# Patient Record
Sex: Male | Born: 1945 | Race: White | Hispanic: No | Marital: Married | Smoking: Current every day smoker
Health system: Southern US, Community
[De-identification: ages and names within clinical notes are randomized; demographics above are authoritative.]

## PROBLEM LIST (undated history)

## (undated) DIAGNOSIS — Z860100 Personal history of colon polyps, unspecified: Secondary | ICD-10-CM

## (undated) DIAGNOSIS — M1990 Unspecified osteoarthritis, unspecified site: Secondary | ICD-10-CM

## (undated) DIAGNOSIS — C4491 Basal cell carcinoma of skin, unspecified: Secondary | ICD-10-CM

## (undated) DIAGNOSIS — K649 Unspecified hemorrhoids: Secondary | ICD-10-CM

## (undated) DIAGNOSIS — K5900 Constipation, unspecified: Secondary | ICD-10-CM

## (undated) DIAGNOSIS — K469 Unspecified abdominal hernia without obstruction or gangrene: Secondary | ICD-10-CM

## (undated) DIAGNOSIS — G5600 Carpal tunnel syndrome, unspecified upper limb: Secondary | ICD-10-CM

## (undated) DIAGNOSIS — T8859XA Other complications of anesthesia, initial encounter: Secondary | ICD-10-CM

## (undated) DIAGNOSIS — R351 Nocturia: Secondary | ICD-10-CM

## (undated) DIAGNOSIS — R972 Elevated prostate specific antigen [PSA]: Secondary | ICD-10-CM

## (undated) DIAGNOSIS — T4145XA Adverse effect of unspecified anesthetic, initial encounter: Secondary | ICD-10-CM

## (undated) DIAGNOSIS — Z9889 Other specified postprocedural states: Secondary | ICD-10-CM

## (undated) DIAGNOSIS — M069 Rheumatoid arthritis, unspecified: Secondary | ICD-10-CM

## (undated) DIAGNOSIS — K5790 Diverticulosis of intestine, part unspecified, without perforation or abscess without bleeding: Secondary | ICD-10-CM

## (undated) DIAGNOSIS — K219 Gastro-esophageal reflux disease without esophagitis: Secondary | ICD-10-CM

## (undated) DIAGNOSIS — E785 Hyperlipidemia, unspecified: Secondary | ICD-10-CM

## (undated) DIAGNOSIS — R112 Nausea with vomiting, unspecified: Secondary | ICD-10-CM

## (undated) DIAGNOSIS — M75120 Complete rotator cuff tear or rupture of unspecified shoulder, not specified as traumatic: Secondary | ICD-10-CM

## (undated) DIAGNOSIS — R51 Headache: Secondary | ICD-10-CM

## (undated) DIAGNOSIS — Z5189 Encounter for other specified aftercare: Secondary | ICD-10-CM

## (undated) DIAGNOSIS — D649 Anemia, unspecified: Secondary | ICD-10-CM

## (undated) DIAGNOSIS — R200 Anesthesia of skin: Secondary | ICD-10-CM

## (undated) DIAGNOSIS — H269 Unspecified cataract: Secondary | ICD-10-CM

## (undated) HISTORY — PX: VARICOCELECTOMY: SHX1084

## (undated) HISTORY — PX: CYSTOSCOPY: SUR368

## (undated) HISTORY — PX: ANKLE SURGERY: SHX546

## (undated) HISTORY — PX: BACK SURGERY: SHX140

## (undated) HISTORY — DX: Basal cell carcinoma of skin, unspecified: C44.91

## (undated) HISTORY — DX: Anemia, unspecified: D64.9

## (undated) HISTORY — DX: Rheumatoid arthritis, unspecified: M06.9

## (undated) HISTORY — DX: Personal history of colonic polyps: Z86.010

## (undated) HISTORY — DX: Carpal tunnel syndrome, unspecified upper limb: G56.00

## (undated) HISTORY — PX: TONSILLECTOMY: SHX5217

## (undated) HISTORY — DX: Unspecified cataract: H26.9

## (undated) HISTORY — DX: Complete rotator cuff tear or rupture of unspecified shoulder, not specified as traumatic: M75.120

## (undated) HISTORY — DX: Personal history of colon polyps, unspecified: Z86.0100

## (undated) HISTORY — DX: Elevated prostate specific antigen (PSA): R97.20

## (undated) HISTORY — DX: Unspecified osteoarthritis, unspecified site: M19.90

## (undated) HISTORY — DX: Hyperlipidemia, unspecified: E78.5

## (undated) HISTORY — PX: APPENDECTOMY: SHX54

## (undated) HISTORY — DX: Unspecified abdominal hernia without obstruction or gangrene: K46.9

## (undated) HISTORY — DX: Encounter for other specified aftercare: Z51.89

## (undated) HISTORY — DX: Diverticulosis of intestine, part unspecified, without perforation or abscess without bleeding: K57.90

## (undated) HISTORY — PX: CARPAL TUNNEL RELEASE: SHX101

## (undated) HISTORY — DX: Unspecified hemorrhoids: K64.9

## (undated) HISTORY — DX: Gastro-esophageal reflux disease without esophagitis: K21.9

## (undated) HISTORY — PX: RECTAL SURGERY: SHX760

---

## 1970-01-07 HISTORY — PX: OTHER SURGICAL HISTORY: SHX169

## 2000-10-24 ENCOUNTER — Ambulatory Visit (HOSPITAL_COMMUNITY): Admission: RE | Admit: 2000-10-24 | Discharge: 2000-10-24 | Payer: Self-pay | Admitting: Gastroenterology

## 2000-10-24 ENCOUNTER — Encounter: Payer: Self-pay | Admitting: Internal Medicine

## 2000-10-24 ENCOUNTER — Encounter (INDEPENDENT_AMBULATORY_CARE_PROVIDER_SITE_OTHER): Payer: Self-pay | Admitting: Specialist

## 2003-04-05 ENCOUNTER — Encounter: Payer: Self-pay | Admitting: Internal Medicine

## 2003-07-12 ENCOUNTER — Encounter: Admission: RE | Admit: 2003-07-12 | Discharge: 2003-07-12 | Payer: Self-pay | Admitting: Internal Medicine

## 2003-08-10 ENCOUNTER — Encounter: Payer: Self-pay | Admitting: Internal Medicine

## 2003-11-07 ENCOUNTER — Ambulatory Visit: Payer: Self-pay | Admitting: Internal Medicine

## 2003-11-28 ENCOUNTER — Ambulatory Visit: Payer: Self-pay | Admitting: Gastroenterology

## 2004-02-10 ENCOUNTER — Ambulatory Visit: Payer: Self-pay | Admitting: Internal Medicine

## 2004-04-04 ENCOUNTER — Ambulatory Visit: Payer: Self-pay | Admitting: Internal Medicine

## 2004-04-12 ENCOUNTER — Ambulatory Visit: Payer: Self-pay | Admitting: Internal Medicine

## 2004-10-18 ENCOUNTER — Ambulatory Visit: Payer: Self-pay | Admitting: Internal Medicine

## 2004-10-24 ENCOUNTER — Ambulatory Visit: Payer: Self-pay | Admitting: Internal Medicine

## 2004-10-25 ENCOUNTER — Ambulatory Visit: Payer: Self-pay | Admitting: Internal Medicine

## 2004-11-06 ENCOUNTER — Ambulatory Visit: Payer: Self-pay | Admitting: Internal Medicine

## 2004-11-13 ENCOUNTER — Ambulatory Visit: Payer: Self-pay | Admitting: Gastroenterology

## 2004-11-19 ENCOUNTER — Ambulatory Visit (HOSPITAL_COMMUNITY): Admission: RE | Admit: 2004-11-19 | Discharge: 2004-11-19 | Payer: Self-pay | Admitting: Gastroenterology

## 2004-11-19 ENCOUNTER — Ambulatory Visit: Payer: Self-pay | Admitting: Gastroenterology

## 2004-11-19 DIAGNOSIS — K649 Unspecified hemorrhoids: Secondary | ICD-10-CM | POA: Insufficient documentation

## 2005-01-08 ENCOUNTER — Ambulatory Visit: Payer: Self-pay | Admitting: Internal Medicine

## 2005-01-14 ENCOUNTER — Ambulatory Visit: Payer: Self-pay | Admitting: Internal Medicine

## 2005-01-24 ENCOUNTER — Ambulatory Visit: Payer: Self-pay | Admitting: Gastroenterology

## 2005-01-30 ENCOUNTER — Ambulatory Visit: Payer: Self-pay | Admitting: Gastroenterology

## 2005-03-29 ENCOUNTER — Ambulatory Visit: Payer: Self-pay | Admitting: Internal Medicine

## 2005-05-07 ENCOUNTER — Ambulatory Visit: Payer: Self-pay | Admitting: Internal Medicine

## 2005-05-16 ENCOUNTER — Ambulatory Visit: Payer: Self-pay | Admitting: Internal Medicine

## 2005-05-18 ENCOUNTER — Ambulatory Visit: Payer: Self-pay | Admitting: Internal Medicine

## 2005-05-22 ENCOUNTER — Encounter: Admission: RE | Admit: 2005-05-22 | Discharge: 2005-05-22 | Payer: Self-pay | Admitting: Internal Medicine

## 2005-05-24 ENCOUNTER — Ambulatory Visit: Payer: Self-pay | Admitting: Internal Medicine

## 2005-06-18 ENCOUNTER — Encounter: Admission: RE | Admit: 2005-06-18 | Discharge: 2005-06-18 | Payer: Self-pay | Admitting: Neurology

## 2005-08-15 ENCOUNTER — Ambulatory Visit: Payer: Self-pay | Admitting: Internal Medicine

## 2005-12-03 ENCOUNTER — Ambulatory Visit: Payer: Self-pay | Admitting: Internal Medicine

## 2005-12-18 ENCOUNTER — Ambulatory Visit: Payer: Self-pay | Admitting: Internal Medicine

## 2006-03-24 ENCOUNTER — Ambulatory Visit: Payer: Self-pay | Admitting: Internal Medicine

## 2006-06-05 ENCOUNTER — Ambulatory Visit: Payer: Self-pay | Admitting: Gastroenterology

## 2006-06-06 ENCOUNTER — Ambulatory Visit: Payer: Self-pay | Admitting: Gastroenterology

## 2006-06-27 ENCOUNTER — Ambulatory Visit: Payer: Self-pay | Admitting: Internal Medicine

## 2006-06-27 ENCOUNTER — Encounter: Payer: Self-pay | Admitting: Internal Medicine

## 2006-07-28 ENCOUNTER — Ambulatory Visit: Payer: Self-pay | Admitting: Gastroenterology

## 2006-07-31 DIAGNOSIS — M1990 Unspecified osteoarthritis, unspecified site: Secondary | ICD-10-CM | POA: Insufficient documentation

## 2006-07-31 DIAGNOSIS — J309 Allergic rhinitis, unspecified: Secondary | ICD-10-CM | POA: Insufficient documentation

## 2006-08-04 ENCOUNTER — Ambulatory Visit: Payer: Self-pay | Admitting: Internal Medicine

## 2006-08-04 ENCOUNTER — Encounter: Admission: RE | Admit: 2006-08-04 | Discharge: 2006-08-04 | Payer: Self-pay | Admitting: Internal Medicine

## 2006-08-20 ENCOUNTER — Encounter: Payer: Self-pay | Admitting: Internal Medicine

## 2006-09-03 ENCOUNTER — Ambulatory Visit (HOSPITAL_BASED_OUTPATIENT_CLINIC_OR_DEPARTMENT_OTHER): Admission: RE | Admit: 2006-09-03 | Discharge: 2006-09-03 | Payer: Self-pay | Admitting: Orthopedic Surgery

## 2006-10-03 ENCOUNTER — Telehealth: Payer: Self-pay | Admitting: Internal Medicine

## 2006-10-15 ENCOUNTER — Ambulatory Visit: Payer: Self-pay | Admitting: Internal Medicine

## 2006-10-15 LAB — CONVERTED CEMR LAB
Bilirubin Urine: NEGATIVE
Nitrite: NEGATIVE
Protein, U semiquant: NEGATIVE
Specific Gravity, Urine: 1.02
Urobilinogen, UA: 2
WBC Urine, dipstick: NEGATIVE
pH: 6.5

## 2007-03-27 ENCOUNTER — Telehealth: Payer: Self-pay | Admitting: Internal Medicine

## 2007-03-30 ENCOUNTER — Ambulatory Visit: Payer: Self-pay | Admitting: Internal Medicine

## 2007-03-30 DIAGNOSIS — K219 Gastro-esophageal reflux disease without esophagitis: Secondary | ICD-10-CM | POA: Insufficient documentation

## 2007-03-31 ENCOUNTER — Encounter: Payer: Self-pay | Admitting: Internal Medicine

## 2007-04-02 ENCOUNTER — Telehealth: Payer: Self-pay | Admitting: Internal Medicine

## 2007-04-14 ENCOUNTER — Ambulatory Visit: Payer: Self-pay | Admitting: Internal Medicine

## 2007-04-14 ENCOUNTER — Telehealth: Payer: Self-pay | Admitting: Internal Medicine

## 2007-04-14 LAB — CONVERTED CEMR LAB
Nitrite: NEGATIVE
Protein, U semiquant: NEGATIVE
WBC Urine, dipstick: NEGATIVE

## 2007-04-16 ENCOUNTER — Encounter: Payer: Self-pay | Admitting: Internal Medicine

## 2007-04-28 ENCOUNTER — Ambulatory Visit: Payer: Self-pay | Admitting: Internal Medicine

## 2007-05-04 ENCOUNTER — Telehealth: Payer: Self-pay | Admitting: Internal Medicine

## 2007-05-09 ENCOUNTER — Emergency Department (HOSPITAL_COMMUNITY): Admission: EM | Admit: 2007-05-09 | Discharge: 2007-05-09 | Payer: Self-pay | Admitting: Family Medicine

## 2007-06-08 ENCOUNTER — Encounter: Payer: Self-pay | Admitting: Gastroenterology

## 2007-08-04 ENCOUNTER — Ambulatory Visit: Payer: Self-pay | Admitting: Internal Medicine

## 2007-08-05 ENCOUNTER — Telehealth: Payer: Self-pay | Admitting: Internal Medicine

## 2007-08-10 ENCOUNTER — Telehealth: Payer: Self-pay | Admitting: Internal Medicine

## 2007-08-11 ENCOUNTER — Telehealth (INDEPENDENT_AMBULATORY_CARE_PROVIDER_SITE_OTHER): Payer: Self-pay | Admitting: *Deleted

## 2007-08-11 ENCOUNTER — Encounter: Payer: Self-pay | Admitting: Internal Medicine

## 2007-08-11 ENCOUNTER — Telehealth: Payer: Self-pay | Admitting: Internal Medicine

## 2007-08-17 ENCOUNTER — Encounter: Admission: RE | Admit: 2007-08-17 | Discharge: 2007-10-07 | Payer: Self-pay | Admitting: Internal Medicine

## 2007-08-31 ENCOUNTER — Telehealth: Payer: Self-pay | Admitting: Internal Medicine

## 2007-09-03 ENCOUNTER — Encounter: Payer: Self-pay | Admitting: Internal Medicine

## 2007-09-07 ENCOUNTER — Telehealth: Payer: Self-pay | Admitting: Internal Medicine

## 2007-09-08 ENCOUNTER — Encounter: Payer: Self-pay | Admitting: Internal Medicine

## 2007-09-09 ENCOUNTER — Telehealth (INDEPENDENT_AMBULATORY_CARE_PROVIDER_SITE_OTHER): Payer: Self-pay | Admitting: *Deleted

## 2007-09-16 ENCOUNTER — Ambulatory Visit (HOSPITAL_BASED_OUTPATIENT_CLINIC_OR_DEPARTMENT_OTHER): Admission: RE | Admit: 2007-09-16 | Discharge: 2007-09-16 | Payer: Self-pay | Admitting: Orthopedic Surgery

## 2007-09-29 ENCOUNTER — Encounter: Payer: Self-pay | Admitting: Internal Medicine

## 2007-10-15 ENCOUNTER — Telehealth: Payer: Self-pay | Admitting: Internal Medicine

## 2007-10-20 ENCOUNTER — Ambulatory Visit: Payer: Self-pay | Admitting: Internal Medicine

## 2007-10-20 LAB — CONVERTED CEMR LAB
ALT: 18 units/L (ref 0–53)
AST: 18 units/L (ref 0–37)
Albumin: 3.9 g/dL (ref 3.5–5.2)
Alkaline Phosphatase: 68 units/L (ref 39–117)
Basophils Relative: 0.4 % (ref 0.0–3.0)
Bilirubin Urine: NEGATIVE
Bilirubin, Direct: 0.1 mg/dL (ref 0.0–0.3)
Blood in Urine, dipstick: NEGATIVE
Calcium: 9.7 mg/dL (ref 8.4–10.5)
Creatinine, Ser: 1.1 mg/dL (ref 0.4–1.5)
Eosinophils Relative: 2.9 % (ref 0.0–5.0)
Glucose, Bld: 97 mg/dL (ref 70–99)
Hemoglobin: 13.1 g/dL (ref 13.0–17.0)
Ketones, urine, test strip: NEGATIVE
Monocytes Relative: 9.6 % (ref 3.0–12.0)
Neutro Abs: 3.6 10*3/uL (ref 1.4–7.7)
Nitrite: NEGATIVE
PSA: 4.6 ng/mL — ABNORMAL HIGH (ref 0.10–4.00)
Platelets: 308 10*3/uL (ref 150–400)
Protein, U semiquant: NEGATIVE
RBC: 4.39 M/uL (ref 4.22–5.81)
Sodium: 144 meq/L (ref 135–145)
Total Bilirubin: 0.9 mg/dL (ref 0.3–1.2)
Total Protein: 6.7 g/dL (ref 6.0–8.3)
Urobilinogen, UA: 1
VLDL: 51 mg/dL — ABNORMAL HIGH (ref 0–40)
WBC Urine, dipstick: NEGATIVE
WBC: 6.6 10*3/uL (ref 4.5–10.5)

## 2007-11-09 ENCOUNTER — Ambulatory Visit: Payer: Self-pay | Admitting: Internal Medicine

## 2007-11-09 ENCOUNTER — Telehealth: Payer: Self-pay | Admitting: Internal Medicine

## 2007-11-09 LAB — HM COLONOSCOPY

## 2007-11-16 ENCOUNTER — Telehealth: Payer: Self-pay | Admitting: Internal Medicine

## 2007-11-30 ENCOUNTER — Ambulatory Visit: Payer: Self-pay | Admitting: Internal Medicine

## 2007-12-14 ENCOUNTER — Encounter: Payer: Self-pay | Admitting: Internal Medicine

## 2008-01-09 ENCOUNTER — Ambulatory Visit: Payer: Self-pay | Admitting: Internal Medicine

## 2008-01-25 ENCOUNTER — Encounter: Payer: Self-pay | Admitting: Internal Medicine

## 2008-02-02 ENCOUNTER — Encounter: Payer: Self-pay | Admitting: Internal Medicine

## 2008-03-01 ENCOUNTER — Ambulatory Visit: Payer: Self-pay | Admitting: Gastroenterology

## 2008-03-08 ENCOUNTER — Telehealth: Payer: Self-pay | Admitting: Gastroenterology

## 2008-03-23 ENCOUNTER — Ambulatory Visit: Payer: Self-pay | Admitting: Gastroenterology

## 2008-04-06 ENCOUNTER — Ambulatory Visit: Payer: Self-pay | Admitting: Gastroenterology

## 2008-04-13 ENCOUNTER — Ambulatory Visit: Payer: Self-pay | Admitting: Gastroenterology

## 2008-04-20 ENCOUNTER — Ambulatory Visit: Payer: Self-pay | Admitting: Internal Medicine

## 2008-04-27 ENCOUNTER — Ambulatory Visit: Payer: Self-pay | Admitting: Gastroenterology

## 2008-04-28 ENCOUNTER — Ambulatory Visit: Payer: Self-pay | Admitting: Internal Medicine

## 2008-05-04 ENCOUNTER — Ambulatory Visit: Payer: Self-pay | Admitting: Gastroenterology

## 2008-05-05 ENCOUNTER — Ambulatory Visit: Payer: Self-pay | Admitting: Family Medicine

## 2008-05-11 ENCOUNTER — Ambulatory Visit: Payer: Self-pay | Admitting: Gastroenterology

## 2008-05-16 ENCOUNTER — Telehealth: Payer: Self-pay | Admitting: Gastroenterology

## 2008-05-18 ENCOUNTER — Ambulatory Visit: Payer: Self-pay | Admitting: Gastroenterology

## 2008-05-25 ENCOUNTER — Ambulatory Visit: Payer: Self-pay | Admitting: Gastroenterology

## 2008-05-26 ENCOUNTER — Telehealth: Payer: Self-pay | Admitting: Gastroenterology

## 2008-06-01 ENCOUNTER — Ambulatory Visit: Payer: Self-pay | Admitting: Gastroenterology

## 2008-06-13 ENCOUNTER — Ambulatory Visit: Payer: Self-pay | Admitting: Gastroenterology

## 2008-06-22 ENCOUNTER — Ambulatory Visit: Payer: Self-pay | Admitting: Internal Medicine

## 2008-07-15 ENCOUNTER — Encounter: Payer: Self-pay | Admitting: Gastroenterology

## 2008-07-19 ENCOUNTER — Encounter (INDEPENDENT_AMBULATORY_CARE_PROVIDER_SITE_OTHER): Payer: Self-pay | Admitting: *Deleted

## 2008-08-05 ENCOUNTER — Encounter: Payer: Self-pay | Admitting: Internal Medicine

## 2008-08-11 ENCOUNTER — Encounter: Payer: Self-pay | Admitting: Internal Medicine

## 2008-08-19 ENCOUNTER — Telehealth: Payer: Self-pay | Admitting: Gastroenterology

## 2008-08-24 ENCOUNTER — Encounter: Payer: Self-pay | Admitting: Gastroenterology

## 2008-08-24 ENCOUNTER — Telehealth: Payer: Self-pay | Admitting: Gastroenterology

## 2008-08-24 DIAGNOSIS — K228 Other specified diseases of esophagus: Secondary | ICD-10-CM

## 2008-08-24 DIAGNOSIS — K2289 Other specified disease of esophagus: Secondary | ICD-10-CM | POA: Insufficient documentation

## 2008-10-11 ENCOUNTER — Telehealth: Payer: Self-pay | Admitting: Gastroenterology

## 2008-12-07 ENCOUNTER — Telehealth: Payer: Self-pay | Admitting: Internal Medicine

## 2008-12-08 ENCOUNTER — Ambulatory Visit: Payer: Self-pay | Admitting: Internal Medicine

## 2008-12-09 ENCOUNTER — Encounter: Admission: RE | Admit: 2008-12-09 | Discharge: 2008-12-09 | Payer: Self-pay | Admitting: Internal Medicine

## 2008-12-12 ENCOUNTER — Telehealth: Payer: Self-pay | Admitting: Internal Medicine

## 2008-12-12 DIAGNOSIS — M542 Cervicalgia: Secondary | ICD-10-CM | POA: Insufficient documentation

## 2008-12-13 ENCOUNTER — Telehealth: Payer: Self-pay | Admitting: Internal Medicine

## 2009-01-11 ENCOUNTER — Encounter: Payer: Self-pay | Admitting: Internal Medicine

## 2009-01-17 ENCOUNTER — Encounter: Payer: Self-pay | Admitting: Gastroenterology

## 2009-02-02 ENCOUNTER — Encounter: Payer: Self-pay | Admitting: Internal Medicine

## 2009-02-24 ENCOUNTER — Telehealth: Payer: Self-pay | Admitting: Internal Medicine

## 2009-03-07 HISTORY — PX: NECK SURGERY: SHX720

## 2009-03-15 ENCOUNTER — Telehealth: Payer: Self-pay | Admitting: Internal Medicine

## 2009-03-15 ENCOUNTER — Encounter: Payer: Self-pay | Admitting: Internal Medicine

## 2009-03-16 ENCOUNTER — Ambulatory Visit: Payer: Self-pay | Admitting: Internal Medicine

## 2009-03-20 LAB — CONVERTED CEMR LAB: Vit D, 25-Hydroxy: 22 ng/mL — ABNORMAL LOW (ref 30–89)

## 2009-03-29 ENCOUNTER — Encounter: Payer: Self-pay | Admitting: Internal Medicine

## 2009-04-02 ENCOUNTER — Encounter: Payer: Self-pay | Admitting: Internal Medicine

## 2009-04-13 ENCOUNTER — Encounter: Payer: Self-pay | Admitting: Internal Medicine

## 2009-05-19 ENCOUNTER — Encounter: Payer: Self-pay | Admitting: Internal Medicine

## 2009-06-14 ENCOUNTER — Telehealth: Payer: Self-pay | Admitting: Internal Medicine

## 2009-07-13 ENCOUNTER — Ambulatory Visit: Payer: Self-pay | Admitting: Internal Medicine

## 2009-07-13 LAB — CONVERTED CEMR LAB
Cholesterol: 261 mg/dL — ABNORMAL HIGH (ref 0–200)
HDL: 49.8 mg/dL (ref >39.00)
Total CHOL/HDL Ratio: 5
Triglycerides: 111 mg/dL (ref 0.0–149.0)
VLDL: 22.2 mg/dL (ref 0.0–40.0)

## 2009-07-14 ENCOUNTER — Encounter: Payer: Self-pay | Admitting: Internal Medicine

## 2009-07-21 ENCOUNTER — Ambulatory Visit: Payer: Self-pay | Admitting: Internal Medicine

## 2009-07-21 DIAGNOSIS — R972 Elevated prostate specific antigen [PSA]: Secondary | ICD-10-CM | POA: Insufficient documentation

## 2009-08-03 ENCOUNTER — Encounter: Payer: Self-pay | Admitting: Internal Medicine

## 2009-09-14 ENCOUNTER — Ambulatory Visit: Payer: Self-pay | Admitting: Internal Medicine

## 2009-09-14 LAB — CONVERTED CEMR LAB
Alkaline Phosphatase: 67 units/L (ref 39–117)
HDL: 41.4 mg/dL (ref >39.00)
Triglycerides: 120 mg/dL (ref 0.0–149.0)

## 2009-09-20 ENCOUNTER — Ambulatory Visit: Payer: Self-pay | Admitting: Internal Medicine

## 2009-09-20 DIAGNOSIS — E785 Hyperlipidemia, unspecified: Secondary | ICD-10-CM | POA: Insufficient documentation

## 2009-12-07 ENCOUNTER — Ambulatory Visit: Payer: Self-pay | Admitting: Internal Medicine

## 2009-12-07 DIAGNOSIS — R21 Rash and other nonspecific skin eruption: Secondary | ICD-10-CM | POA: Insufficient documentation

## 2009-12-12 LAB — CONVERTED CEMR LAB
Folate: 13.9 ng/mL
Sed Rate: 17 mm/hr (ref 0–22)
Vitamin B-12: 364 pg/mL (ref 211–911)

## 2009-12-14 ENCOUNTER — Encounter: Payer: Self-pay | Admitting: Internal Medicine

## 2009-12-29 ENCOUNTER — Encounter: Payer: Self-pay | Admitting: Internal Medicine

## 2010-01-02 ENCOUNTER — Telehealth: Payer: Self-pay | Admitting: Internal Medicine

## 2010-01-25 ENCOUNTER — Encounter: Payer: Self-pay | Admitting: Gastroenterology

## 2010-02-06 NOTE — Progress Notes (Signed)
Summary: Pt req to get cholesterol lvl and vit d lvl checked  Phone Note Call from Patient Call back at Home Phone [phone]   Caller: Patient Summary of Call: Pt called and was going to sch a lab to get Vit D lvl rechecked and pt also would like to get his cholesterol lvl checked at the same time. Need to order lab for cholesterol.      Initial call taken by: Lucy Antigua,  June 14, 2009 4:48 PM  Follow-up for Phone Call        ok to schedule (fasting) 272.4 for cholesterol. Follow-up by: Gladis Riffle, RN,  June 15, 2009 9:26 AM  Additional Follow-up for Phone Call Additional follow up Details #1::        I called pt and sch vit d and cholesterol lab for 07/13/09 and follow up for 07/21/09, as noted above.   Additional Follow-up by: Lucy Antigua,  June 15, 2009 9:58 AM

## 2010-02-06 NOTE — Progress Notes (Signed)
Summary: Vit D order  Phone Note Call from Patient   Caller: Spouse Call For: Birdie Sons MD Summary of Call: seeing acupuncturist; they want a Vitamin D level checked - can you order? Initial call taken by: Raechel Ache, RN,  March 15, 2009 9:19 AM  Follow-up for Phone Call        ok Follow-up by: Birdie Sons MD,  March 15, 2009 2:00 PM  Additional Follow-up for Phone Call Additional follow up Details #1::        scheduled Additional Follow-up by: Raechel Ache, RN,  March 15, 2009 2:09 PM

## 2010-02-06 NOTE — Progress Notes (Signed)
Summary: 2nd opinion  Phone Note Call from Patient   Caller: Patient Call For: Birdie Sons MD Summary of Call: wants neuro 2nd opinion before he has surgery. Saw Dr Phoebe Perch. Initial call taken by: Raechel Ache, RN,  February 24, 2009 8:56 AM  Follow-up for Phone Call        ok per Dr Cato Mulligan- suggest [facility] Told patient he needs to check with insurance to see if they'll cover. Sent to [place]. Follow-up by: Raechel Ache, RN,  February 24, 2009 8:57 AM

## 2010-02-06 NOTE — Letter (Signed)
Summary: [facility]  [facility]   Imported By: Maryln Gottron 04/27/2009 12:55:49  _____________________________________________________________________  External Attachment:    Type:   Image     Comment:   External Document

## 2010-02-06 NOTE — Assessment & Plan Note (Signed)
Summary: blisters all over fingers/cjr   Vital Signs:  Patient profile:   65 year old male Weight:      142 pounds Temp:     98.2 degrees F oral BP sitting:   142 / 78  (left arm) Cuff size:   regular  Vitals Entered By: Alfred Levins, CMA (December 07, 2009 11:22 AM) CC: burning blisters on hands   Primary Care Dequavion Follette:  Birdie Sons, MD  CC:  burning blisters on hands.  History of Present Illness: 3 month hx of intermittent blistering sensation of hands---he will notice small red dots on hands. has some associated numbness only at site of "blister". no unusual exposures. He does admit to working more with with over the past several weeks to months. No fever, chills, other rashes.  Patient denies any other complaints in a complete review of systems.  Current Medications (verified): 1)  Proair Hfa 108 (90 Base) Mcg/act  Aers (Albuterol Sulfate) .... 2 Puffs Three Times A Day As Needed 2)  Flomax 0.4 Mg Cp24 (Tamsulosin Hcl) .... Take 1 Capsule By Mouth Daily 3)  Prevacid 30 Mg Cpdr (Lansoprazole) .Marland Kitchen.. 1 Tablet By Mouth Once Daily 4)  Fish Oil 1000 Mg Caps (Omega-3 Fatty Acids) .Marland Kitchen.. 1 Capsule By Mouth Three Times A Day 5)  Hydrocodone-Acetaminophen 5-500 Mg Tabs (Hydrocodone-Acetaminophen) .... One Every 6 Hours For Pain As Needed 6)  Simvastatin 20 Mg Tabs (Simvastatin) .Marland Kitchen.. 1 By Mouth At Bedtime 7)  Gabapentin 100 Mg Caps (Gabapentin) .... Take One Tab By Mouth Two Times A Day 8)  Benadryl 25 Mg Caps (Diphenhydramine Hcl) .... As Needed  Allergies (verified): 1)  Ibuprofen (Ibuprofen) 2)  * Ivp Dye 3)  Levaquin (Levofloxacin) 4)  Morphine Sulfate (Morphine Sulfate) 5)  Sulfamethoxazole (Sulfamethoxazole) 6)  Anaprox (Naproxen Sodium) 7)  Aspirin (Aspirin) 8)  Codeine Phosphate (Codeine Phosphate)  Past History:  Past Medical History: Last updated: 09/20/2009 Allergic rhinitis Osteoarthritis Elevated Lipids recent ankle surgery GERD Hyperlipidemia  Past  Surgical History: Last updated: 07/21/2009 EGD-06/27/2006 Appendectomy Tonsillectomy Varicocelr removed L ankle mass removed 1972 ankle surgery--left (bednarz) neck surgery 3/11  Family History: Last updated: 05/25/2008 Family History Diabetes 1st degree relative--sister sister with breast CA 2nd sister with breast CA mother deceased - strokes 65 father - cerebral aneurysm---87 yo No FH of Colon Cancer:  Social History: Last updated: 05/25/2008 Married Current Smoker less than 1 ppd Alcohol use-no Daily Caffeine Use -6 Illicit Drug Use - no Patient does not get regular exercise.   Risk Factors: Exercise: no (03/01/2008)  Risk Factors: Smoking Status: current (07/31/2006)  Physical Exam  General:  well-developed well-nourished male in no acute distress. HEENT exam atraumatic normocephalic no chest clear to auscultation neurologic exam no significant rashes. No findings on the hands. Sensation is intact in extremities bilaterally.   Impression & Recommendations:  Problem # 1:  RASH-NONVESICULAR (ICD-782.1) more subjective findings than anything else. Objective findings are minimal if any. I'll check lab work space on the subjective findings. We'll determine what to do after that. Orders: Venipuncture (46962) Specimen Handling (95284) TLB-B12 + Folate Pnl (13244_01027-O53/GUY) TLB-TSH (Thyroid Stimulating Hormone) (84443-TSH) TLB-Sedimentation Rate (ESR) (85652-ESR)  Complete Medication List: 1)  Proair Hfa 108 (90 Base) Mcg/act Aers (Albuterol sulfate) .... 2 puffs three times a day as needed 2)  Flomax 0.4 Mg Cp24 (Tamsulosin hcl) .... Take 1 capsule by mouth daily 3)  Prevacid 30 Mg Cpdr (Lansoprazole) .Marland Kitchen.. 1 tablet by mouth once daily 4)  Fish Oil 1000  Mg Caps (Omega-3 fatty acids) .Marland Kitchen.. 1 capsule by mouth three times a day 5)  Hydrocodone-acetaminophen 5-500 Mg Tabs (Hydrocodone-acetaminophen) .... One every 6 hours for pain as needed 6)  Simvastatin 20 Mg Tabs  (Simvastatin) .Marland Kitchen.. 1 by mouth at bedtime 7)  Gabapentin 100 Mg Caps (Gabapentin) .... Take one tab by mouth two times a day 8)  Benadryl 25 Mg Caps (Diphenhydramine hcl) .... As needed   Orders Added: 1)  Est. Patient Level III [16109] 2)  Venipuncture [60454] 3)  Specimen Handling [99000] 4)  TLB-B12 + Folate Pnl [[id number]-B12/FOL] 5)  TLB-TSH (Thyroid Stimulating Hormone) [84443-TSH] 6)  TLB-Sedimentation Rate (ESR) [09811-BJY]

## 2010-02-06 NOTE — Letter (Signed)
Summary: [facility]  [facility]   Imported By: Maryln Gottron 06/13/2009 13:06:40  _____________________________________________________________________  External Attachment:    Type:   Image     Comment:   External Document

## 2010-02-06 NOTE — Assessment & Plan Note (Signed)
Summary: 2 MONTH FUP//CCM   Vital Signs:  Patient profile:   65 year old male Height:      64 inches Weight:      142 pounds BMI:     24.46 Temp:     97.7 degrees F oral BP sitting:   120 / 70  (left arm) Cuff size:   regular  Vitals Entered By: Kern Reap CMA Duncan Dull) (September 20, 2009 8:10 AM) CC: follow-up visit Is Patient Diabetic? No Pain Assessment Patient in pain? no        Primary Care Anyelo Mccue:  Birdie Sons, MD  CC:  follow-up visit.  History of Present Illness:  Follow-Up Visit      This is a 65 year old man who presents for Follow-up visit.  The patient denies chest pain and palpitations.  Since the last visit the patient notes no new problems or concerns.  The patient reports taking meds as prescribed.  When questioned about possible medication side effects, the patient notes none.   seeing neurosurgeon---babtist s/p neck surgery  All other systems reviewed and were negative except has some ankle pain---had great results with steroid injection. In addition he says that he gets occasional blisters on his fingers associated with a "numb" sensation.    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 2 Puffs Three Times A Day As Needed 2)  Flomax 0.4 Mg Cp24 (Tamsulosin Hcl) .... Take 1 Capsule By Mouth Daily 3)  Anucort-Hc 25 Mg Supp (Hydrocortisone Acetate) .... Put One in Rectum Twice Daily For 7 Days. 4)  Prevacid 30 Mg Cpdr (Lansoprazole) .Marland Kitchen.. 1 Tablet By Mouth Once Daily 5)  Tylenol Ex St  Arthritis Pain 500 Mg Tabs (Acetaminophen) .... Take By Mouth Once Daily 6)  Fish Oil 1000 Mg Caps (Omega-3 Fatty Acids) .Marland Kitchen.. 1 Capsule By Mouth Three Times A Day 7)  Amitriptyline Hcl 25 Mg Tabs (Amitriptyline Hcl) .... One At Bedtime 8)  Hydrocodone-Acetaminophen 5-500 Mg Tabs (Hydrocodone-Acetaminophen) .... One Every 6 Hours For Pain As Needed 9)  Vitamin D (Ergocalciferol) 50000 Unit Caps (Ergocalciferol) .... One By Mouth Weekly 10)  Simvastatin 20 Mg Tabs (Simvastatin) .Marland Kitchen.. 1 By Mouth At Bedtime 11)  Gabapentin 100 Mg Caps (Gabapentin) .... Take One Tab By Mouth Two Times A Day 12)  Benadryl 25 Mg Caps (Diphenhydramine Hcl) .... As Needed  Allergies: 1)  Ibuprofen (Ibuprofen) 2)  * Ivp Dye 3)  Levaquin (Levofloxacin) 4)  Morphine Sulfate (Morphine Sulfate) 5)  Sulfamethoxazole (Sulfamethoxazole) 6)  Anaprox (Naproxen Sodium) 7)  Aspirin (Aspirin) 8)  Codeine Phosphate (Codeine Phosphate)  Past History:  Past Surgical History: Last updated: 07/21/2009 EGD-06/27/2006 Appendectomy Tonsillectomy Varicocelr removed L ankle mass removed 1972 ankle surgery--left (bednarz) neck surgery 3/11  Family History: Last updated: 05/25/2008 Family History Diabetes 1st degree relative--sister sister with breast CA 2nd sister with breast CA mother deceased - strokes 50 father - cerebral aneurysm---87 yo No FH of Colon Cancer:  Social History: Last updated: 05/25/2008 Married Current Smoker less than 1 ppd Alcohol use-no Daily Caffeine Use -6 Illicit Drug Use -  no Patient does not get regular exercise.   Risk Factors: Exercise: no (03/01/2008)  Risk Factors: Smoking Status: current (07/31/2006)  Past Medical History: Allergic rhinitis Osteoarthritis Elevated Lipids recent ankle surgery GERD Hyperlipidemia  Physical Exam  General:  alert and well-developed.   Head:  normocephalic and atraumatic.   Eyes:  pupils equal and pupils round.   Ears:  R ear normal and L  ear normal.   Neck:  slight tenderness in the posterior neck with flexion Lungs:  Normal respiratory effort, chest expands symmetrically. Lungs are clear to auscultation, no crackles or wheezes. Heart:  normal rate and regular rhythm.   Abdomen:  soft and non-tender.   Msk:  normal ROM and no joint tenderness.   Neurologic:  cranial nerves II-XII intact and gait normal.   Skin:  turgor normal and color normal.   Psych:  normally interactive and not anxious appearing.     Impression & Recommendations:  Problem # 1:  SPONDYLOSIS, CERVICAL, WITH RADICULOPATHY (ICD-723.4) doing well s/p surgery  Problem # 2:  GERD (ICD-530.81) sxs controlled continue current medications  His updated medication list for this problem includes:    Prevacid 30 Mg Cpdr (Lansoprazole) .Marland Kitchen... 1 tablet by mouth once daily  EGD: Location: [facility]   (04/28/2008)  Labs Reviewed: Hgb: 13.1 (10/20/2007)   Hct: 38.9 (10/20/2007)  Problem # 3:  HYPERLIPIDEMIA (ICD-272.4) controlled continue current medications  His updated medication list for this problem includes:    Simvastatin 20 Mg Tabs (Simvastatin) .Marland Kitchen... 1 by mouth at bedtime  Labs Reviewed: SGOT: 23 (09/14/2009)   SGPT: 24 (09/14/2009)   HDL:41.40 (09/14/2009), 49.80 (07/13/2009)  LDL:106 (09/14/2009), DEL (10/20/2007)  Chol:171 (09/14/2009), 261 (07/13/2009)  Trig:120.0 (09/14/2009), 111.0 (07/13/2009)  Complete Medication List: 1)  Proair Hfa 108 (90 Base) Mcg/act Aers (Albuterol sulfate) .... 2 puffs three times a day as needed 2)  Flomax 0.4 Mg Cp24 (Tamsulosin hcl) .... Take 1 capsule by mouth daily 3)  Anucort-hc 25 Mg Supp (Hydrocortisone acetate) .... Put one in rectum twice daily for 7 days. 4)  Prevacid 30 Mg Cpdr (Lansoprazole) .Marland Kitchen.. 1 tablet by mouth once daily 5)  Tylenol Ex St Arthritis Pain 500 Mg Tabs (Acetaminophen) .... Take by mouth once daily 6)  Fish Oil 1000 Mg Caps (Omega-3 fatty acids) .Marland Kitchen.. 1 capsule by mouth  three times a day 7)  Amitriptyline Hcl 25 Mg Tabs (Amitriptyline hcl) .... One at bedtime 8)  Hydrocodone-acetaminophen 5-500 Mg Tabs (Hydrocodone-acetaminophen) .... One every 6 hours for pain as needed 9)  Vitamin D (ergocalciferol) 50000 Unit Caps (Ergocalciferol) .... One by mouth weekly 10)  Simvastatin 20 Mg Tabs (Simvastatin) .Marland Kitchen.. 1 by mouth at bedtime 11)  Gabapentin 100 Mg Caps (Gabapentin) .... Take one tab by mouth two times a day 12)  Benadryl 25 Mg Caps (Diphenhydramine hcl) .... As needed   Patient Instructions: 1)  Please schedule a follow-up appointment in 6 months. 2)  lipids 272.4 3)  liver 995.2 4)  bmet-995.2

## 2010-02-06 NOTE — Letter (Signed)
Summary: Order, Summary and Medication List/[facility]  Order, Summary and Medication List/[facility]   Imported By: Maryln Gottron 04/07/2009 15:27:04  _____________________________________________________________________  External Attachment:    Type:   Image     Comment:   External Document

## 2010-02-06 NOTE — Letter (Signed)
Summary: [facility]uro[facility]  [facility]   Imported By: Maryln Gottron 04/27/2009 12:56:57  _____________________________________________________________________  External Attachment:    Type:   Image     Comment:   External Document

## 2010-02-06 NOTE — Consult Note (Signed)
Summary: [facility]  [facility]   Imported By: Maryln Gottron 02/09/2009 12:50:58  _____________________________________________________________________  External Attachment:    Type:   Image     Comment:   External Document

## 2010-02-06 NOTE — Letter (Signed)
Summary: [facility]  [facility]   Imported By: Maryln Gottron 04/18/2009 12:41:29  _____________________________________________________________________  External Attachment:    Type:   Image     Comment:   External Document

## 2010-02-06 NOTE — Medication Information (Signed)
Summary: Prescriber Response Form/[facility]  Prescriber Response Form/[facility]   Imported By: Sherian Rein 01/19/2009 11:21:50  _____________________________________________________________________  External Attachment:    Type:   Image     Comment:   External Document

## 2010-02-06 NOTE — Letter (Signed)
Summary: [facility]  [facility]   Imported By: Maryln Gottron 02/09/2009 14:39:33  _____________________________________________________________________  External Attachment:    Type:   Image     Comment:   External Document

## 2010-02-06 NOTE — Assessment & Plan Note (Signed)
Summary: follow up from labs re: vit d and cholesterol/cjr   Vital Signs:  Patient profile:   65 year old male Height:      64 inches (162.56 cm) Weight:      146.31 pounds (66.50 kg) Temp:     98.3 degrees F (36.83 degrees C) oral Pulse rate:   80 / minute BP sitting:   122 / 70  (left arm) Cuff size:   regular  Vitals Entered By: Josph Macho RMA (July 21, 2009 8:52 AM)  Procedure Note Last Tetanus: Tdap (11/09/2007)  Injections: Duration of symptoms: months  Procedure # 1: joint injection    Location: ankle    Technique: 24 g needle    Medication: 20 mg depomedrol    Anesthesia: 1% lidocaine w/o epinephrine  CC: Follow-up visit on labs: Vit D & cholesterol/ CF Is Patient Diabetic? No   Primary Care Provider:  Birdie Sons, MD  CC:  Follow-up visit on labs: Vit D & cholesterol/ CF.  History of Present Illness:  Follow-Up Visit      This is a 65 year old man who presents for Follow-up visit.  The patient denies chest pain and palpitations.  Since the last visit the patient notes no new problems or concerns except has developed significant right ankle pain---no swelling, no trauma.  The patient reports taking meds as prescribed.  When questioned about possible medication side effects, the patient notes none.    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 2 Puffs Three Times A Day As Needed 2)  Flomax 0.4 Mg Cp24 (Tamsulosin Hcl) .... Take 1 Capsule By Mouth Daily 3)  Anucort-Hc 25 Mg Supp (Hydrocortisone Acetate) .... Put One in  Rectum Twice Daily For 7 Days. 4)  Prevacid 30 Mg Cpdr (Lansoprazole) .Marland Kitchen.. 1 Tablet By Mouth Once Daily 5)  Tylenol Ex St Arthritis Pain 500 Mg Tabs (Acetaminophen) .... Take By Mouth Once Daily 6)  Fish Oil 1000 Mg Caps (Omega-3 Fatty Acids) .Marland Kitchen.. 1 Capsule By Mouth Three Times A Day 7)  Amitriptyline Hcl 25 Mg Tabs (Amitriptyline Hcl) .... One At Bedtime 8)  Hydrocodone-Acetaminophen 5-500 Mg Tabs (Hydrocodone-Acetaminophen) .... One Every 6 Hours For Pain As Needed 9)  Vitamin D (Ergocalciferol) 50000 Unit Caps (Ergocalciferol) .... One By Mouth Weekly  Allergies (verified): 1)  Ibuprofen (Ibuprofen) 2)  * Ivp Dye 3)  Levaquin (Levofloxacin) 4)  Morphine Sulfate (Morphine Sulfate) 5)  Sulfamethoxazole (Sulfamethoxazole) 6)  Anaprox (Naproxen Sodium) 7)  Aspirin (Aspirin) 8)  Codeine Phosphate (Codeine Phosphate)  Past History:  Past Medical History: Last updated: 03/30/2007 Allergic rhinitis Osteoarthritis Elevated Lipids recent ankle surgery GERD  Family History: Last updated: 05/25/2008 Family History Diabetes 1st degree relative--sister sister with breast CA 2nd sister with breast CA mother deceased - strokes 67 father - cerebral aneurysm---87 yo No FH of Colon Cancer:  Social History: Last updated: 05/25/2008 Married Current Smoker less than 1 ppd Alcohol use-no Daily Caffeine Use -6 Illicit Drug Use - no Patient does not get regular exercise.   Risk Factors: Exercise: no (03/01/2008)  Risk  Factors: Smoking Status: current (07/31/2006)  Past Surgical History: EGD-06/27/2006 Appendectomy Tonsillectomy Varicocelr removed L ankle mass removed 1972 ankle surgery--left (bednarz) neck surgery 3/11  Review of Systems       All other systems reviewed and were negative except chronic ankle pain  Physical Exam  General:  alert and well-developed.   Head:  normocephalic, atraumatic, and no abnormalities observed.   Eyes:  pupils equal and pupils round.     Ears:  R ear normal and L ear normal.   Msk:  No deformity or scoliosis noted of thoracic or lumbar spine.   Neurologic:  cranial nerves II-XII intact and gait normal.     Impression & Recommendations:  Problem # 1:  GERD (ICD-530.81) sxs are well controlled His updated medication list for this problem includes:    Prevacid 30 Mg Cpdr (Lansoprazole) .Marland Kitchen... 1 tablet by mouth once daily  [facility]  Labs Reviewed: Hgb: 13.1 (10/20/2007)   Hct: 38.9 (10/20/2007)  Problem # 2:  PSA, INCREASED (ICD-790.93) has regular f/u  Problem # 3:  BENIGN PROSTATIC HYPERTROPHY, WITH URINARY OBSTRUCTION (ICD-600.01) sxs sable could be related to elevated psa His updated medication list for this problem includes:    Flomax 0.4 Mg Cp24 (Tamsulosin hcl) .Marland Kitchen... Take 1 capsule by mouth daily  Problem # 4:  OSTEOARTHRITIS (ICD-715.90) likely the cuase of his ankle pain discussed risks and benefits of treatment opitons he would like to proceed with injection verbal consent obtained His updated medication list for this problem includes:    Tylenol Ex St Arthritis Pain 500 Mg Tabs (Acetaminophen) .Marland Kitchen... Take by mouth once daily    Hydrocodone-acetaminophen 5-500 Mg Tabs (Hydrocodone-acetaminophen) ..... One every 6 hours for pain as needed  Orders: Joint Aspirate / Injection, Intermediate (20605) Depo- Medrol 40mg  (J1030)  Complete Medication List: 1)  Proair Hfa 108 (90 Base) Mcg/act Aers (Albuterol sulfate) .... 2 puffs three times a day as needed 2)  Flomax 0.4 Mg Cp24 (Tamsulosin hcl) .... Take 1 capsule by mouth daily 3)  Anucort-hc 25 Mg Supp (Hydrocortisone acetate) .... Put one in rectum twice daily for 7 days. 4)  Prevacid 30 Mg Cpdr (Lansoprazole) .Marland Kitchen.. 1 tablet by mouth once daily 5)  Tylenol Ex St Arthritis Pain 500 Mg Tabs (Acetaminophen) .... Take by mouth once daily 6)  Fish Oil 1000 Mg Caps (Omega-3 fatty acids) .Marland Kitchen.. 1 capsule by mouth  three times a day 7)  Amitriptyline Hcl 25 Mg Tabs (Amitriptyline hcl) .... One at bedtime 8)  Hydrocodone-acetaminophen 5-500 Mg Tabs (Hydrocodone-acetaminophen) .... One every 6 hours for pain as needed 9)  Vitamin D (ergocalciferol) 50000 Unit Caps (Ergocalciferol) .... One by mouth weekly 10)  Simvastatin 20 Mg Tabs (Simvastatin) .Marland Kitchen.. 1 by mouth at bedtime  Patient Instructions: 1)  Please schedule a follow-up appointment in 2 months. 2)  lipids 272.4 3)  liver 995.2 Prescriptions: SIMVASTATIN 20 MG TABS (SIMVASTATIN) 1 by mouth at bedtime  #90 x 3   Entered and Authorized by:   Birdie Sons MD   Signed by:   Birdie Sons MD on 07/21/2009   Method used:   [facility]        [facility] [phone][facility] (retail)       [address]       [place], [place]  [address]       Ph: [phone]       Fax: [phone]  RxID:   [id number]

## 2010-02-06 NOTE — Consult Note (Signed)
Summary: [facility]  [facility]   Imported By: Maryln Gottron 04/04/2009 15:37:56  _____________________________________________________________________  External Attachment:    Type:   Image     Comment:   External Document

## 2010-02-06 NOTE — Letter (Signed)
Summary: [facility]  [facility]   Imported By: Maryln Gottron 08/23/2009 12:57:51  _____________________________________________________________________  External Attachment:    Type:   Image     Comment:   External Document

## 2010-02-06 NOTE — Letter (Signed)
Summary: [facility]  [facility]   Imported By: Maryln Gottron 08/10/2009 11:12:38  _____________________________________________________________________  External Attachment:    Type:   Image     Comment:   External Document

## 2010-02-08 NOTE — Letter (Signed)
Summary: [facility]  [facility]   Imported By: Maryln Gottron 01/22/2010 14:55:10  _____________________________________________________________________  External Attachment:    Type:   Image     Comment:   External Document

## 2010-02-08 NOTE — Consult Note (Signed)
Summary: [facility]  [facility]   Imported By: Maryln Gottron 01/12/2010 15:43:29  _____________________________________________________________________  External Attachment:    Type:   Image     Comment:   External Document

## 2010-02-08 NOTE — Progress Notes (Signed)
Summary: labs  Phone Note Call from Patient Call back at Home Phone [phone]   Caller: Patient Call For: Birdie Sons MD Summary of Call: Pt is asking for lab results as he is seeing a Derm. and he wants to know the results and to run more tests.  Initial call taken by: Lynann Beaver CMA AAMA,  January 02, 2010 8:45 AM  Follow-up for Phone Call        ok to give lab results Follow-up by: Birdie Sons MD,  January 02, 2010 10:16 AM  Additional Follow-up for Phone Call Additional follow up Details #1::        pt just wanted to know what was drawn so he could tell his dermatologist.  Told pt Sed Rate, B12, and TSH Additional Follow-up by: Alfred Levins, CMA,  January 02, 2010 1:20 PM

## 2010-02-14 NOTE — Medication Information (Signed)
Summary: Prescriber Response Form/[facility]  Prescriber Response Form/[facility]   Imported By: Lennie Odor 02/05/2010 11:41:48  _____________________________________________________________________  External Attachment:    Type:   Image     Comment:   External Document

## 2010-03-14 ENCOUNTER — Other Ambulatory Visit: Payer: Self-pay

## 2010-03-21 ENCOUNTER — Ambulatory Visit: Payer: Self-pay | Admitting: Internal Medicine

## 2010-03-22 ENCOUNTER — Other Ambulatory Visit: Payer: Self-pay

## 2010-04-03 ENCOUNTER — Ambulatory Visit: Payer: Self-pay | Admitting: Internal Medicine

## 2010-04-12 ENCOUNTER — Telehealth: Payer: Self-pay | Admitting: Gastroenterology

## 2010-04-12 NOTE — Telephone Encounter (Signed)
Stool softeners if he has hard stools. Can try hemorrhoid supp OTC if bleeding worsens

## 2010-04-12 NOTE — Telephone Encounter (Signed)
Pt states that he had neck surgery last week. He has noticed that he is having rectal bleeding, states it is more than he has had in the past. He notices it when he wipes, it is bright red. Pt is on pain medicine and I let him  Know that it can cause constipation and this can cause more problems with bleeding from hemorrhoids. Pt wants to know if there is anything he needs to do differently. Dr. Arlyce Dice please advise.

## 2010-04-12 NOTE — Telephone Encounter (Signed)
Pt aware of Dr. Kaplan's recommendations. 

## 2010-05-22 NOTE — Assessment & Plan Note (Signed)
[facility]                         GASTROENTEROLOGY OFFICE NOTE   Christian Roberts, Christian Roberts                      MRN:          [id number]  DATE:06/05/2006                            DOB:          August 16, 1945    PROBLEM:  Pyrosis.   HISTORY OF PRESENT ILLNESS:  Christian Roberts has returned for re-evaluation.  Despite taking omeprazole 20 mg daily he is having frequent episodes of  pyrosis.  This occurs throughout the day, several days a week.  He also  complains of postprandial abdominal distension.  He has several poorly  formed stools a day.  He has a history of reflux and IBS.  Colonoscopy  in 2002 showed diverticulosis only.  He has taken several courses of  prednisone for a sinus infection and joint pains.   Other medications include:  1. Pseudoephedrine.  2. Benadryl.  3. Darvocet p.r.n.   EXAMINATION:  Pulse 78, blood pressure 116/60, weight 137.  HEENT:  EOMI.  PERRLA. Sclerae are anicteric.  Conjunctivae are pink.  NECK:  Supple without thyromegaly, adenopathy or carotid bruits.  CHEST:  Clear to auscultation and percussion without adventitious  sounds.  CARDIAC:  Regular rhythm; normal S1 S2.  There are no murmurs, gallops  or rubs.  ABDOMEN:  Bowel sounds are normoactive.  Abdomen is soft, non-tender and  non-distended.  There are no abdominal masses, tenderness, splenic  enlargement or hepatomegaly.  EXTREMITIES:  Full range of motion.  No cyanosis, clubbing or edema.  RECTAL:  Deferred.   IMPRESSION:  1. Gastroesophageal reflux disease - poorly controlled on omeprazole.      A history of prednisone use raises the question of Candida      esophagitis.  2. Dyspepsia - likely secondary to number 1.   RECOMMENDATIONS:  The patient will consider enrollment in a GERD study.  Failing that I will schedule him for upper endoscopy and switch him to  Zegerid.     Barbette Hair. Arlyce Dice, MD,FACG  Electronically Signed    RDK/MedQ  DD: 06/05/2006   DT: 06/05/2006  Job #: [id number]   cc:   Valetta Mole. Swords, MD

## 2010-05-22 NOTE — Op Note (Signed)
NAMEGERY, SABEDRA               ACCOUNT NO.:  [id number]   MEDICAL RECORD NO.:  [id number]          PATIENT TYPE:  AMB   LOCATION:  DSC                          FACILITY:  [facility]   PHYSICIAN:  Leonides Grills, M.D.     DATE OF BIRTH:  09/17/1945   DATE OF PROCEDURE:  09/16/2007  DATE OF DISCHARGE:                               OPERATIVE REPORT   PREOPERATIVE DIAGNOSES:  1. Left anterior ankle impingement.  2. 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   POSTOPERATIVE DIAGNOSES:  1. Left anterior ankle impingement.  2. 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   OPERATION:  1. Left ankle arthroscopy with extensive debridement.  2. Left debridement and drilling lateral and medial talar dome      osteochondral lesions.   ANESTHESIA:  General.   SURGEON:  Leonides Grills, MD   ASSISTANT:  Richardean Canal, PA-C   ESTIMATED BLOOD LOSS:  Minimal.   TOURNIQUET TIME:  None.   COMPLICATIONS:  None.   Lesion size:  Medial is quite large 1.5 x 2 cm, lateral lesion is a  shoulder type lesion probably 1.5 x 4 mm.   DISPOSITION:  Stable to PR.   INDICATIONS:  This is a 65 year old male who had a previous arthroscopic  debridement and drilling of his osteochondral lesions.  He had  persistent pain and swelling and synovitis to the point it was  interfering with his life.  Once again, he was consented for the above  procedure.  All risks which include infection, nerve or vessel injury,  persistent pain, worsening pain, prolonged recovery, stiffness,  arthritis, recurrence of the osteochondral lesion, and possibility of an  ankle fusion versus replacement in the future were all explained.  Questions were encouraged and answered.   OPERATION:  The patient was brought to the operating room and placed in  supine position after adequate general endotracheal anesthesia was  administered as well as Ancef gram IV piggyback.  Bump was placed in  left ipsilateral  hip with silicone pad and the left lower extremity was  then prepped and draped in a sterile manner.  No tourniquet was used.  Anatomical landmarks including anterior tibialis tendon, peroneus  tertius tendon, and superficial peroneal nerve were mapped out.  Previous incisions were identified and marked as well.  A spinal needle  was then placed just medial to the anterior tibialis tendon through the  previous incision and 20 mL of normal saline instilled into the ankle.  Nick-and-spread technique was then utilized creating anteromedial  incision.  Blunt tip trocar with cannula followed by camera was then  placed in the ankle in direct visualization.  Anterolateral portals  created with a spinal needle followed by nick-and-spread technique  through this previous incision.  There was a tremendous amount of  synovitis over the entire anterolateral aspects of the ankle, and  extensive debridement was then performed with a shaver as well as a  radiofrequency bevel.  Once this was debrided back, it was obvious that  there was an osteochondral lesion over the lateral talar dome.  This was  then debrided with a shaver as well as a House curette down to bone.  We  then placed multiple drill holes using a microfracture technique.  Pictures were obtained throughout the procedure.  Lateral gutter was  also debrided as well as anterolaterally.  We then placed the camera  anterolaterally visualizing anteromedially.  Again, there was a  tremendous amount of synovitis in this area.  This was debrided with a  shaver as well as radiofrequency bevel.  This included into the medial  gutter.  This lesion, however, was quite large involving the talar dome.  Again, this was 2 x 1.5 cm in size and was completely delaminated and  elevated and floating literally within the joint.  This was carefully  debrided back with a House curette and then removed.  The area was  shaven with a Cuda shaver to bleeding bone.   The entire lesion bled  beautifully.  There were no cystic areas.  There was synovitis  posterolaterally, and this was also removed as well.  The pictures were  obtained throughout the procedure.  Camera was removed.  Wounds were  closed with 4-0 nylon stitch.  Sterile dressing was applied.  Cam walker  boot was applied.  The patient was stable to PR.      Leonides Grills, M.D.  Electronically Signed     PB/MEDQ  D:  09/16/2007  T:  09/17/2007  Job:  [id number]

## 2010-05-22 NOTE — Assessment & Plan Note (Signed)
[facility]                                 ON-CALL NOTE   RAYMEL, CULL                        MRN:          [id number]  DATE:10/15/2006                            DOB:          January 26, 1945    TIME RECEIVED:  5:46 p.m.   CALLER:  Clydie Braun at [employer].   He sees Dr. Cato Mulligan.  Telephone [phone].  The patient was seen today and  diagnosed with prostatitis.  He was given a prescription for a 10-day  course of Cipro; however, the pharmacist noticed that he is allergic to  [facility], and thinks that this would be a problem.  He is also allergic  to SULFA.  My response is to cancel the prescription for Cipro, but  instead give him 10 days of doxycycline 100 mg to take b.i.d.  He will  follow up as needed.     Tera Mater. Clent Ridges, MD  Electronically Signed    SAF/MedQ  DD: 10/15/2006  DT: 10/16/2006  Job #: [id number]

## 2010-05-22 NOTE — Op Note (Signed)
NAMEKORBYN, CHOPIN               ACCOUNT NO.:  [id number]   MEDICAL RECORD NO.:  [id number]          PATIENT TYPE:  AMB   LOCATION:  DSC                          FACILITY:  [facility]   PHYSICIAN:  Leonides Grills, M.D.     DATE OF BIRTH:  1945/12/20   DATE OF PROCEDURE:  09/03/2006  DATE OF DISCHARGE:                               OPERATIVE REPORT   PREOPERATIVE DIAGNOSIS:  1. Left anterior ankle impingement.  2. Left lateral talar dome shoulder type osteochondral lesion.  3. Anterior distal tibial spurs.   POSTOPERATIVE DIAGNOSIS:  1. Left anterior ankle impingement.  2. Left lateral talar dome shoulder type osteochondral lesion.  3. Anterior distal tibial spurs.   OPERATION:  1. Left ankle arthroscopy with extensive debridement.  2. Left ankle debridement drilling lateral talar dome osteochondral      lesion.  3. Stress x-rays, left ankle.  4. Excision anterior distal tibial spurs, left ankle.   ANESTHESIA:  General with block.   SURGEON:  Leonides Grills, M.D.   ASSISTANT:  Evlyn Kanner, P.A.-C.   ESTIMATED BLOOD LOSS:  Minimal.   TOURNIQUET TIME:  Approximately an hour.   COMPLICATIONS:  None.   DISPOSITION:  Stable to the PR.   INDICATIONS:  This is a 65 year old male who has had persistent  anterolateral ankle pain.  We obtained an MRI which showed that he had a  lateral talar dome osteochondral lesion with advanced synovitis in this  area. 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 Due to the proximity of both pathology, it was  difficult to determine which was bothering him more. We scheduled him  for possible subtalar arthrodesis with local bone graft. He was  consented to the above procedure.  All risks including infection, nerve  or vessel injury, nonunion, malunion, hardware rotation, hardware  failure, persistent pain, worse pain, prolonged recovery, stiffness,  arthritis, and the possibility of future surgery were  all explained,  questions were encouraged and answered.   PROCEDURE:  The patient was brought to the operating room and placed in  the supine position. After adequate general endotracheal anesthesia was  administered with block as well as Ancef 1 gram IV piggyback, the  patient was then placed in a sloppy lateral position on a beanbag.  All  bony prominences were well padded.  The left lower extremity was prepped  and draped in a sterile manner. We started the procedure by anatomically  mapping out the anterior tibialis and peroneus tertius superficial  peroneal nerve.  A spinal needle was then placed just medial to the  anterior tibialis tendon into the joint and 20 mL normal saline filled  the joint.  Weston Brass and spread technique was then utilized to create the  anterior medial portal just medial to the anterior tibialis tendon.  Blunt tip trocar with cannula followed by camera was then placed into  the ankle and under direct visualization, the anterolateral portal was  created lateral to the peroneus tertius tendon as well as the  superficial peroneal nerve.  There was a tremendous amount  of synovitis  over the entire anterior aspect of the ankle as well as anterolaterally  extending into the lateral gutter. There was an obvious large spur  extending over the entire anterior aspect of the tibia. With  dorsiflexion, there was anterior ankle impingement, as well.  There was  a patch of arthritis in the anterior portion of the tibial plafond as  well as an obvious lateral shoulder type talar dome osteochondral  lesion. We proceeded to perform an extensive debridement with several  different instruments to include a radiofrequency bevel, 2.9 mm shaver,  and 3.5 mm bur as well as a synovectomy rongeur.  This took quite some  time to remove all the impinging areas and with dorsiflexion,  essentially this was completely debrided.  We then addressed the lateral  talar dome osteochondral lesion  with curets. This was carried down to  bone and normal cartilage at its periphery. We then, using a  microfracture instrument, performed the drilling in multiple areas. This  bled nicely. We then placed the camera anterolaterally visualizing  anteromedially and, again, there was a large spur that extended with the  synovitis.  We continued the extensive debridement and spur removal. We  found that there was a loose body in the medial gutter that was part of  the anterior deltoid fibers. This was carefully dissected loose and  pulled out with a synovectomy rongeur.  Stress x-rays were obtained and  showed no impingement anteriorly with the spur adequately removed from  the anterior aspect of the ankle and lateral plane of the ankle with  neutral and forced dorsiflexion.  It was determined, at this point, that  due to the advanced pathology within the ankle, that the more  conservative approach would be not to fuse the subtalar joint to see how  much pain relief he will get.  We will later do a differential subtalar  block if he did have persistent pain in the sinus tarsi area lateral  gutter region to see where his pain was coming from at this point.  The  tourniquet was deflated.  Hemostasis was obtained.  The lateral talar  dome bled beautifully.  There was no pulsatile bleeding.  The wounds  were closed with 4-0 nylon stitch.  A sterile dressing was applied.  A  modified Jones dressing was applied.  The patient was stable to PR.      Leonides Grills, M.D.  Electronically Signed     PB/MEDQ  D:  09/03/2006  T:  09/03/2006  Job:  [id number]   cc:   Valetta Mole. Swords, MD

## 2010-06-19 ENCOUNTER — Encounter: Payer: Self-pay | Admitting: Internal Medicine

## 2010-06-19 ENCOUNTER — Ambulatory Visit (INDEPENDENT_AMBULATORY_CARE_PROVIDER_SITE_OTHER): Payer: BC Managed Care – PPO | Admitting: Internal Medicine

## 2010-06-19 VITALS — BP 110/70 | Temp 98.0°F | Wt 146.0 lb

## 2010-06-19 DIAGNOSIS — M1990 Unspecified osteoarthritis, unspecified site: Secondary | ICD-10-CM

## 2010-06-19 MED ORDER — PREDNISONE 10 MG PO TABS: 10.0000 mg | ORAL_TABLET | Freq: Every day | ORAL | Status: AC

## 2010-06-19 MED ORDER — METHYLPREDNISOLONE ACETATE 80 MG/ML IJ SUSP: 80.0000 mg | Freq: Once | INTRAMUSCULAR | Status: DC

## 2010-06-19 NOTE — Patient Instructions (Signed)
Keep left foot elevated as much as possible  Continue ice treatments  Call or return to clinic prn if these symptoms worsen or fail to improve as anticipated.

## 2010-06-19 NOTE — Progress Notes (Signed)
  Subjective:    Patient ID: Christian Roberts, male    DOB: Jan 31, 1945, 65 y.o.   MRN: [id number]  HPI  65 year old patient who is seen today with a chief complaint of left ankle pain. He is a recent cervical spine surgery in March of this year. Initially a surgery involving left ankle for a cyst in 1972 he has had recurrent left ankle surgery in September of 2010 as well as September of 2009. The fascia days he's had increasing pain and normal left ankle medially. There's been no trauma. He states that he has had similar flares of pain over the years that have responded to steroids. He states he is allergic to nonsteroidal anti-inflammatory drugs. He has been using ice which has helped the swelling but he is still uncomfortable he is also using lidocaine patches  Review of Systems  Musculoskeletal: Positive for joint swelling and gait problem.       Objective:   Physical Exam  Constitutional: He appears well-developed and well-nourished. No distress.       Afebrile cervical collar in place  Musculoskeletal:       Left ankle revealed no erythema or excessive warmth. He did have lidocaine patches over the area medially;  no ischemic changes were noted          Assessment & Plan:   Arthritis left ankle. We'll refill his hydrocodone. He has responded well to steroids in the past we'll give an injection of Depo-Medrol 80 and also oral medications if needed

## 2010-06-20 ENCOUNTER — Ambulatory Visit: Payer: Self-pay | Admitting: Internal Medicine

## 2010-08-14 ENCOUNTER — Other Ambulatory Visit (INDEPENDENT_AMBULATORY_CARE_PROVIDER_SITE_OTHER): Payer: BC Managed Care – PPO

## 2010-08-14 DIAGNOSIS — E785 Hyperlipidemia, unspecified: Secondary | ICD-10-CM

## 2010-08-14 DIAGNOSIS — T887XXA Unspecified adverse effect of drug or medicament, initial encounter: Secondary | ICD-10-CM

## 2010-08-14 DIAGNOSIS — D649 Anemia, unspecified: Secondary | ICD-10-CM

## 2010-08-14 DIAGNOSIS — E039 Hypothyroidism, unspecified: Secondary | ICD-10-CM

## 2010-08-14 LAB — CBC WITH DIFFERENTIAL/PLATELET
Basophils Absolute: 0 10*3/uL (ref 0.0–0.1)
Basophils Relative: 0.6 % (ref 0.0–3.0)
Eosinophils Absolute: 0.2 10*3/uL (ref 0.0–0.7)
Hemoglobin: 13.5 g/dL (ref 13.0–17.0)
Lymphs Abs: 2.4 10*3/uL (ref 0.7–4.0)
MCHC: 33.3 g/dL (ref 30.0–36.0)
MCV: 86.9 fl (ref 78.0–100.0)
Monocytes Absolute: 0.7 10*3/uL (ref 0.1–1.0)
Neutro Abs: 4.9 10*3/uL (ref 1.4–7.7)
RBC: 4.66 Mil/uL (ref 4.22–5.81)
RDW: 15.1 % — ABNORMAL HIGH (ref 11.5–14.6)

## 2010-08-14 LAB — BASIC METABOLIC PANEL
CO2: 29 mEq/L (ref 19–32)
Chloride: 106 mEq/L (ref 96–112)
Glucose, Bld: 110 mg/dL — ABNORMAL HIGH (ref 70–99)
Sodium: 144 mEq/L (ref 135–145)

## 2010-08-14 LAB — LIPID PANEL
Cholesterol: 249 mg/dL — ABNORMAL HIGH (ref 0–200)
HDL: 44.5 mg/dL (ref >39.00)
Triglycerides: 137 mg/dL (ref 0.0–149.0)

## 2010-08-14 LAB — HEPATIC FUNCTION PANEL
Albumin: 4 g/dL (ref 3.5–5.2)
Total Protein: 6.8 g/dL (ref 6.0–8.3)

## 2010-08-15 ENCOUNTER — Telehealth: Payer: Self-pay | Admitting: *Deleted

## 2010-08-15 LAB — LDL CHOLESTEROL, DIRECT: Direct LDL: 172.5 mg/dL

## 2010-08-15 NOTE — Telephone Encounter (Signed)
Would like lab results ASAP.

## 2010-08-17 ENCOUNTER — Other Ambulatory Visit: Payer: Self-pay | Admitting: Gastroenterology

## 2010-08-17 NOTE — Telephone Encounter (Signed)
Pt aware.

## 2010-08-20 ENCOUNTER — Encounter: Payer: Self-pay | Admitting: Internal Medicine

## 2010-08-20 ENCOUNTER — Ambulatory Visit (INDEPENDENT_AMBULATORY_CARE_PROVIDER_SITE_OTHER): Payer: BC Managed Care – PPO | Admitting: Internal Medicine

## 2010-08-20 VITALS — BP 112/64 | Temp 97.6°F | Ht 64.0 in | Wt 143.0 lb

## 2010-08-20 DIAGNOSIS — Z2911 Encounter for prophylactic immunotherapy for respiratory syncytial virus (RSV): Secondary | ICD-10-CM

## 2010-08-20 DIAGNOSIS — N138 Other obstructive and reflux uropathy: Secondary | ICD-10-CM

## 2010-08-20 DIAGNOSIS — E119 Type 2 diabetes mellitus without complications: Secondary | ICD-10-CM | POA: Insufficient documentation

## 2010-08-20 DIAGNOSIS — R7989 Other specified abnormal findings of blood chemistry: Secondary | ICD-10-CM

## 2010-08-20 DIAGNOSIS — E785 Hyperlipidemia, unspecified: Secondary | ICD-10-CM

## 2010-08-20 DIAGNOSIS — N401 Enlarged prostate with lower urinary tract symptoms: Secondary | ICD-10-CM

## 2010-08-20 DIAGNOSIS — R7309 Other abnormal glucose: Secondary | ICD-10-CM

## 2010-08-20 MED ORDER — SIMVASTATIN 20 MG PO TABS: 20.0000 mg | ORAL_TABLET | Freq: Every day | ORAL | Status: DC

## 2010-08-20 NOTE — Progress Notes (Signed)
  Subjective:    Patient ID: Christian Roberts, male    DOB: January 28, 1945, 65 y.o.   MRN: [id number]  HPI  Recent neck surgery---dr sweezy. [facility]. BPH sxs much better. Had labs done a1c elevated at 7.3%  Lipids---has quit simvastatin.  Past Medical History  Diagnosis Date  . Allergic rhinitis   . Osteoarthritis   . Elevated lipids   . GERD (gastroesophageal reflux disease)   . Hyperlipidemia    Past Surgical History  Procedure Date  . Ankle surgery   . Esophagogastroduodenoscopy 06/27/2006  . Appendectomy   . Tonsillectomy   . Varicocelectomy   . Left ankle mass removed 1972  . Neck surgery 03/11    reports that he has been smoking Cigarettes.  He has been smoking about .5 packs per day. He has never used smokeless tobacco. He reports that he does not drink alcohol or use illicit drugs. family history includes Breast cancer in his sisters; Cerebral aneurysm in his father; Diabetes in his sister; and Stroke in his mother. Allergies  Allergen Reactions  . Aspirin     REACTION: unspecified  . Codeine Phosphate     REACTION: unspecified  . Ibuprofen     REACTION: unspecified  . Levofloxacin     REACTION: unspecified  . Morphine Sulfate     REACTION: unspecified  . Naproxen Sodium     REACTION: unspecified  . Sulfamethoxazole     REACTION: unspecified     Review of Systems  patient denies chest pain, shortness of breath, orthopnea. Denies lower extremity edema, abdominal pain, change in appetite, change in bowel movements. Patient denies rashes, musculoskeletal complaints. No other specific complaints in a complete review of systems.      Objective:   Physical Exam  well-developed well-nourished male in no acute distress. HEENT exam atraumatic, normocephalic, neck supple without jugular venous distention. Wearing a neck brace. Chest clear to auscultation cardiac exam S1-S2 are regular. Abdominal exam overweight with bowel sounds,  soft and nontender. Extremities no edema. Neurologic exam is alert with a normal gait.        Assessment & Plan:

## 2010-08-20 NOTE — Assessment & Plan Note (Addendum)
Needs to treat with diet and exercise aggressively- Refer DTC  a1c is diagnostic of DM but that is the only test thus far. I do not see a fasting glucose above 126 or another a1c above 6.5%

## 2010-08-20 NOTE — Assessment & Plan Note (Signed)
Has been noncompliant with meds  resume simvastatin

## 2010-08-20 NOTE — Assessment & Plan Note (Signed)
Much improved. Has f/u with urology

## 2010-09-11 ENCOUNTER — Ambulatory Visit: Payer: BC Managed Care – PPO | Admitting: Dietician

## 2010-09-20 ENCOUNTER — Encounter: Payer: BC Managed Care – PPO | Attending: Internal Medicine | Admitting: Dietician

## 2010-09-20 VITALS — Ht 64.0 in | Wt 134.6 lb

## 2010-09-20 DIAGNOSIS — R7309 Other abnormal glucose: Secondary | ICD-10-CM | POA: Insufficient documentation

## 2010-09-20 DIAGNOSIS — R7989 Other specified abnormal findings of blood chemistry: Secondary | ICD-10-CM | POA: Insufficient documentation

## 2010-09-20 DIAGNOSIS — Z713 Dietary counseling and surveillance: Secondary | ICD-10-CM | POA: Insufficient documentation

## 2010-09-20 NOTE — Progress Notes (Signed)
  Medical Nutrition Therapy:  Appt start time: 1500 end time: 1615   Assessment:  Primary concerns today: Concerned that he has been diagnosed with pre-diabetes. Has a positive family history for diabetes and wishes to prevent having any of the long term complications.  Has lost approximately 20 lbs over the last few months. Has started restricting his diet and has lost some weight due to the food changes following teeth extraction and the new dentures he has.  He has had an adjustment to the dentures, still finds eating many food difficult.Current HgA1C is at 7.3% .  Currently is not monitoring his blood glucose, but is interested in monitoring.  MEDICATIONS:  Currently taking no medications for glucose control.   DIETARY INTAKE:  Usual eating pattern includes meals and 2-3 snacks per day.  Everyday foods include Soft textured foods.  Avoided foods include Foods containing sugar, foods that are hard texture or tough in texture.    24-hr recall:  B ( 4-6:00 AM) omelet (1-2) eggs with mushrooms, peppers, garlic onions, rosemary; coffee, an/or V8 juice.  Snk ( AM): currently using water and other low carb beverages.  In the past used trail mix, or chips or fruit or pretzels.  L (11-3:00 PM): Soup, chicken or a cream soup with crackers in the soup,  (about 2 cups) Snk ( PM): Has a snack about 30% of the time, often has olives D (5-7:00 PM): Salmon (4-6 oz) with topping of onion, peppers, mushrooms, garlic Snk ( PM): bananas and cheerios or jello or 1 cup of chocolate ice cream  Usual physical activity: Has started to walk low and slow.  Has worked himself up to 1.5 miles per day  Estimated energy needs: 1500-1600 calories for maintaining his weight at 135 lb 160-170 g carbohydrates 110-115 g protein 40-43 g fat  Progress Towards Goal(s):  In progress.   Nutritional Diagnosis:  [place]-2.1 Inpaired nutrition utilization As related to glucose.  As evidenced by diagnosis of elevated HgA1C of  7.3%.    Intervention:  Nutrition Continue to eat regular meals and snacks. Continue to limit/omit concentrated carbohydrates, use the diet soda and limit the use of sugared products.  Read food labels.  Limit carbohydrate intake to approximately 45 gm for meals and 15 gm for snacks.  Continue to use lean meats and fish.  Use poultry and fish more often than red meat.  Continue to use the softer foods.  Limit the use of fried foods and fats.  Handouts given during visit include:  Yellow card with diet prescription.  Novo Nordisk carb counting booklet  Handout covering diabetes and basic self-management skills  Monitoring/Evaluation:  Dietary intake, exercise, blood glucose levels or HgA1C , and body weight in 8 weeks after seeing Dr. Cato Mulligan.

## 2010-10-10 LAB — POCT HEMOGLOBIN-HEMACUE: Hemoglobin: 13.6

## 2010-10-19 LAB — POCT HEMOGLOBIN-HEMACUE: Operator id: 112821

## 2010-11-20 ENCOUNTER — Other Ambulatory Visit (INDEPENDENT_AMBULATORY_CARE_PROVIDER_SITE_OTHER): Payer: BC Managed Care – PPO

## 2010-11-20 DIAGNOSIS — R7989 Other specified abnormal findings of blood chemistry: Secondary | ICD-10-CM

## 2010-11-20 DIAGNOSIS — R7309 Other abnormal glucose: Secondary | ICD-10-CM

## 2010-11-20 LAB — HEPATIC FUNCTION PANEL
Bilirubin, Direct: 0 mg/dL (ref 0.0–0.3)
Total Bilirubin: 0.7 mg/dL (ref 0.3–1.2)

## 2010-11-20 LAB — BASIC METABOLIC PANEL
BUN: 12 mg/dL (ref 6–23)
CO2: 30 mEq/L (ref 19–32)
Calcium: 9.6 mg/dL (ref 8.4–10.5)
Chloride: 106 mEq/L (ref 96–112)
Creatinine, Ser: 0.9 mg/dL (ref 0.4–1.5)

## 2010-11-20 LAB — LIPID PANEL
LDL Cholesterol: 68 mg/dL (ref 0–99)
Total CHOL/HDL Ratio: 3
VLDL: 24.8 mg/dL (ref 0.0–40.0)

## 2010-11-27 ENCOUNTER — Encounter: Payer: Self-pay | Admitting: Internal Medicine

## 2010-11-27 ENCOUNTER — Ambulatory Visit (INDEPENDENT_AMBULATORY_CARE_PROVIDER_SITE_OTHER): Payer: BC Managed Care – PPO | Admitting: Internal Medicine

## 2010-11-27 VITALS — BP 132/74 | HR 92 | Temp 98.1°F | Ht 64.0 in | Wt 134.0 lb

## 2010-11-27 DIAGNOSIS — E785 Hyperlipidemia, unspecified: Secondary | ICD-10-CM

## 2010-11-27 DIAGNOSIS — Z23 Encounter for immunization: Secondary | ICD-10-CM

## 2010-11-27 DIAGNOSIS — R7309 Other abnormal glucose: Secondary | ICD-10-CM

## 2010-11-27 DIAGNOSIS — M1990 Unspecified osteoarthritis, unspecified site: Secondary | ICD-10-CM

## 2010-11-27 DIAGNOSIS — R7989 Other specified abnormal findings of blood chemistry: Secondary | ICD-10-CM

## 2010-11-27 MED ORDER — SIMVASTATIN 20 MG PO TABS: 10.0000 mg | ORAL_TABLET | Freq: Every day | ORAL | Status: DC

## 2010-11-27 MED ORDER — PREDNISONE (PAK) 10 MG PO TABS: ORAL_TABLET | ORAL | Status: DC

## 2010-11-27 NOTE — Assessment & Plan Note (Signed)
Much improved I'll decrease simvastatin ---see meds

## 2010-11-27 NOTE — Progress Notes (Signed)
patient comes in for followup of multiple medical problems including type hyperglycemia, hyperlipidemia, hypertension. The patient does not check blood sugar or blood pressure at home. The patetient does not follow an exercise or diet program. The patient denies any polyuria, polydipsia.  In the past the patient has gone to diabetic treatment center. The patient is tolerating medications  Without difficulty. The patient does admit to medication compliance.   Past Medical History  Diagnosis Date  . Allergic rhinitis   . Osteoarthritis   . Elevated lipids   . GERD (gastroesophageal reflux disease)   . Hyperlipidemia     History   Social History  . Marital Status: Married    Spouse Name: N/A    Number of Children: N/A  . Years of Education: N/A   Occupational History  . Not on file.   Social History Main Topics  . Smoking status: Current Some Day Smoker -- 0.5 packs/day    Types: Cigarettes  . Smokeless tobacco: Never Used  . Alcohol Use: No  . Drug Use: No  . Sexually Active: Not on file   Other Topics Concern  . Not on file   Social History Narrative   MarriedDaily caffeine use- 6Patient does not get regular exercise    Past Surgical History  Procedure Date  . Ankle surgery   . Esophagogastroduodenoscopy 06/27/2006  . Appendectomy   . Tonsillectomy   . Varicocelectomy   . Left ankle mass removed 1972  . Neck surgery 03/11    Family History  Problem Relation Age of Onset  . Stroke Mother   . Cerebral aneurysm Father   . Diabetes Sister   . Breast cancer Sister   . Breast cancer Sister     Allergies  Allergen Reactions  . Aspirin     REACTION: unspecified  . Codeine Phosphate     REACTION: unspecified  . Ibuprofen     REACTION: unspecified  . Levofloxacin     REACTION: unspecified  . Morphine Sulfate     REACTION: unspecified  . Naproxen Sodium     REACTION: unspecified  . Sulfamethoxazole     REACTION: unspecified    Current Outpatient  Prescriptions on File Prior to Visit  Medication Sig Dispense Refill  . albuterol (PROAIR HFA) 108 (90 BASE) MCG/ACT inhaler Inhale 2 puffs into the lungs every 6 (six) hours as needed.        . diphenhydrAMINE (BENADRYL) 25 MG tablet Take 25 mg by mouth every 6 (six) hours as needed.        . fish oil-omega-3 fatty acids 1000 MG capsule Take 2 g by mouth daily.        . Gabapentin, PHN, 300 MG TABS Take 300 capsules by mouth 3 (three) times daily.        . Hydrocodone-APAP-Dietary Prod (HYDROCODONE-APAP-NUTRIT SUPP) 10-325 MG MISC Take 1 tablet by mouth every 6 (six) hours as needed. Take 1/2 tab every 6 hours as needed for pain       . lansoprazole (PREVACID) 30 MG capsule Take 30 mg by mouth daily.        . simvastatin (ZOCOR) 20 MG tablet Take 1 tablet (20 mg total) by mouth at bedtime.  90 tablet  3  . Tamsulosin HCl (FLOMAX) 0.4 MG CAPS Take by mouth.        . solifenacin (VESICARE) 5 MG tablet Take 10 mg by mouth daily.           patient denies chest pain,  shortness of breath, orthopnea. Denies lower extremity edema, abdominal pain, change in appetite, change in bowel movements. Patient denies rashes, musculoskeletal complaints. No other specific complaints in a complete review of systems.   BP 132/74  Pulse 92  Temp(Src) 98.1 F (36.7 C) (Oral)  Ht 5\' 4"  (1.626 m)  Wt 134 lb (60.782 kg)  BMI 23.00 kg/m2   Exam:   well-developed well-nourished male in no acute distress. HEENT exam atraumatic, normocephalic, neck supple without jugular venous distention. Chest clear to auscultation cardiac exam S1-S2 are regular. Abdominal exam overweight with bowel sounds, soft and nontender. Extremities no edema. Neurologic exam is alert with a normal gait.

## 2010-11-27 NOTE — Assessment & Plan Note (Signed)
Pt has several MSK complaints I'll check esr if sxs persist and will provide him with a short steroid taper. He states prednisone "worked previously".

## 2010-11-27 NOTE — Assessment & Plan Note (Signed)
Labs much better with weight loss Continue current dietary restrictions

## 2011-02-18 ENCOUNTER — Ambulatory Visit (INDEPENDENT_AMBULATORY_CARE_PROVIDER_SITE_OTHER): Payer: Medicare Other | Admitting: Family Medicine

## 2011-02-18 ENCOUNTER — Encounter: Payer: Self-pay | Admitting: Family Medicine

## 2011-02-18 DIAGNOSIS — K4090 Unilateral inguinal hernia, without obstruction or gangrene, not specified as recurrent: Secondary | ICD-10-CM

## 2011-02-18 NOTE — Progress Notes (Signed)
  Subjective:    Patient ID: Christian Roberts, male    DOB: Nov 02, 1945, 66 y.o.   MRN: [id number]  HPI  Possible hernia. Patient was pulling himself up about 8 days ago and noticed a bulge and pain left inguinal region. Minimal pain since then. No dysuria. No prior history of hernia. Generally healthy. Has history of hyperlipidemia and some BPH but no other real active medical problems.   Review of Systems  Respiratory: Negative for cough.   Cardiovascular: Negative for chest pain.  Genitourinary: Negative for dysuria and testicular pain.  Hematological: Negative for adenopathy.       Objective:   Physical Exam  Constitutional: He appears well-developed and well-nourished.  HENT:  Mouth/Throat: Oropharynx is clear and moist.  Cardiovascular: Normal rate and regular rhythm.   Pulmonary/Chest: Effort normal and breath sounds normal. No respiratory distress. He has no wheezes. He has no rales.  Genitourinary:       Patient has small left inguinal hernia. Soft and nontender  Musculoskeletal: He exhibits no edema.          Assessment & Plan:  Left inguinal hernia. Discussed options with patient. He requests surgical referral. We'll set up

## 2011-02-20 ENCOUNTER — Encounter (INDEPENDENT_AMBULATORY_CARE_PROVIDER_SITE_OTHER): Payer: Self-pay | Admitting: Surgery

## 2011-03-04 ENCOUNTER — Ambulatory Visit (INDEPENDENT_AMBULATORY_CARE_PROVIDER_SITE_OTHER): Payer: Self-pay | Admitting: Surgery

## 2011-03-04 ENCOUNTER — Telehealth (INDEPENDENT_AMBULATORY_CARE_PROVIDER_SITE_OTHER): Payer: Self-pay | Admitting: General Surgery

## 2011-03-04 NOTE — Telephone Encounter (Signed)
Pt called and wanted to come in earlier due having pain in the groin/ questioning hernia. Pt is can wait until the 03-12-11 to come in the see Dr Magnus Ivan pt is aware that if he has problems before then he can call us or go to the ER.

## 2011-03-06 ENCOUNTER — Encounter (INDEPENDENT_AMBULATORY_CARE_PROVIDER_SITE_OTHER): Payer: Self-pay | Admitting: Surgery

## 2011-03-12 ENCOUNTER — Ambulatory Visit (INDEPENDENT_AMBULATORY_CARE_PROVIDER_SITE_OTHER): Payer: BC Managed Care – PPO | Admitting: Surgery

## 2011-03-12 ENCOUNTER — Encounter (INDEPENDENT_AMBULATORY_CARE_PROVIDER_SITE_OTHER): Payer: Self-pay | Admitting: Surgery

## 2011-03-12 VITALS — BP 132/68 | HR 96 | Temp 98.4°F | Ht 64.0 in | Wt 132.8 lb

## 2011-03-12 DIAGNOSIS — K4090 Unilateral inguinal hernia, without obstruction or gangrene, not specified as recurrent: Secondary | ICD-10-CM

## 2011-03-12 NOTE — Progress Notes (Signed)
Patient ID: Christian Roberts, male   DOB: 1945-04-20, 66 y.o.   MRN: [id number]  Chief Complaint  Patient presents with  . Pre-op Exam    eval [facility]    HPI Christian Roberts is a 66 y.o. male.   HPIThis is a very pleasant gentleman referred by Dr. Caryl Never for evaluation of a symptomatic left inguinal hernia. His hernia has been present for several years but has been tiny and asymptomatic until recently when he was doing heavy lifting. Now the hernia has become painful. He has no obstructive symptoms. He reports that it will reduce when he lies down. The pain is an 8 and is mild to moderate in intensity.  Past Medical History  Diagnosis Date  . Allergic rhinitis   . Osteoarthritis   . Elevated lipids   . GERD (gastroesophageal reflux disease)   . Hyperlipidemia   . Diabetes mellitus   . Chills   . Fatigue   . Wears dentures   . Cancer   . Asthma   . Cough   . Shortness of breath   . Bronchitis   . Nausea vomiting and diarrhea   . Abdominal pain   . Rectal bleeding   . Hernia   . Wears glasses   . Blood in urine   . Difficulty urinating   . Weakness     Past Surgical History  Procedure Date  . Ankle surgery   . Esophagogastroduodenoscopy 06/27/2006  . Appendectomy   . Tonsillectomy   . Varicocelectomy   . Left ankle mass removed 1972  . Neck surgery 03/11    Family History  Problem Relation Age of Onset  . Stroke Mother   . Cerebral aneurysm Father   . Diabetes Sister   . Breast cancer Sister   . Breast cancer Sister     Social History History  Substance Use Topics  . Smoking status: Current Some Day Smoker -- 0.5 packs/day    Types: Cigarettes  . Smokeless tobacco: Never Used  . Alcohol Use: No    Allergies  Allergen Reactions  . Aspirin     REACTION: unspecified  . Codeine Phosphate     REACTION: unspecified  . Ibuprofen     REACTION: unspecified  . Levofloxacin     REACTION: unspecified  . Morphine Sulfate     REACTION: unspecified  .  Naproxen Sodium     REACTION: unspecified  . Sulfamethoxazole     REACTION: unspecified    Current Outpatient Prescriptions  Medication Sig Dispense Refill  . albuterol (PROAIR HFA) 108 (90 BASE) MCG/ACT inhaler Inhale 2 puffs into the lungs every 6 (six) hours as needed.        . diphenhydrAMINE (BENADRYL) 25 MG tablet Take 25 mg by mouth every 6 (six) hours as needed.        . fish oil-omega-3 fatty acids 1000 MG capsule Take 2 g by mouth daily.        . lansoprazole (PREVACID) 30 MG capsule Take 30 mg by mouth daily.        . simvastatin (ZOCOR) 20 MG tablet Take 0.5 tablets (10 mg total) by mouth at bedtime.  90 tablet  3  . Tamsulosin HCl (FLOMAX) 0.4 MG CAPS Take by mouth.        . solifenacin (VESICARE) 5 MG tablet Take 10 mg by mouth daily.          Review of Systems Review of Systems  Constitutional: Positive for chills.  Eyes: Negative.   Respiratory: Positive for cough and shortness of breath.   Cardiovascular: Negative.   Gastrointestinal: Positive for nausea and abdominal pain. Negative for vomiting and abdominal distention.  Genitourinary: Positive for hematuria and difficulty urinating.  Musculoskeletal: Positive for arthralgias.  Skin: Negative.   Neurological: Positive for light-headedness and headaches.  Hematological: Negative.   Psychiatric/Behavioral: Negative.     Blood pressure 132/68, pulse 96, temperature 98.4 F (36.9 C), temperature source Temporal, height 5\' 4"  (1.626 m), weight 132 lb 12.8 oz (60.238 kg), SpO2 95.00%.  Physical Exam Physical Exam  Constitutional: He is oriented to person, place, and time. He appears well-developed and well-nourished. No distress.  HENT:  Head: Normocephalic and atraumatic.  Right Ear: External ear normal.  Left Ear: External ear normal.  Nose: Nose normal.  Mouth/Throat: No oropharyngeal exudate.  Eyes: Conjunctivae and EOM are normal. Pupils are equal, round, and reactive to light. No scleral icterus.  Neck:  Neck supple. No tracheal deviation present. No thyromegaly present.  Cardiovascular: Normal rate, regular rhythm, normal heart sounds and intact distal pulses.   No murmur heard. Pulmonary/Chest: Effort normal and breath sounds normal. No respiratory distress. He has no wheezes. He has no rales.  Abdominal: Soft. Bowel sounds are normal. He exhibits no distension. There is no tenderness. There is no rebound and no guarding.       There is an easily reducible left inguinal hernia. There is no right inguinal hernia or umbilical hernia  Musculoskeletal: Normal range of motion. He exhibits no edema and no tenderness.  Lymphadenopathy:    He has no cervical adenopathy.  Neurological: He is alert and oriented to person, place, and time.  Skin: Skin is warm and dry. No rash noted. No erythema.  Psychiatric: His behavior is normal. Judgment normal.    Data Reviewed   Assessment    Easily reducible left inguinal hernia   Plan    Repair with mesh was recommended. I discussed that the laparoscopic and open techniques. He has had a problem with urinary retention with his previous surgeries. I am recommending we do this open with minimal sedation. I discussed the risk of surgery which includes but is not limited to bleeding, infection, injury to surrounding structures, chronic pain, nerve entrapment, recurrence, etc. He understands and wished to proceed. Surgery will be scheduled. Likelihood of success is good       [name] A 03/12/2011, 10:40 AM

## 2011-03-19 ENCOUNTER — Encounter (HOSPITAL_COMMUNITY): Payer: Self-pay | Admitting: Pharmacy Technician

## 2011-03-20 ENCOUNTER — Encounter (HOSPITAL_COMMUNITY)
Admission: RE | Admit: 2011-03-20 | Discharge: 2011-03-20 | Disposition: A | Discharge status: Home / Self Care | Payer: BC Managed Care – PPO | Source: Ambulatory Visit | Attending: Surgery | Admitting: Surgery

## 2011-03-20 ENCOUNTER — Encounter (HOSPITAL_COMMUNITY): Payer: Self-pay

## 2011-03-20 HISTORY — DX: Other specified postprocedural states: Z98.890

## 2011-03-20 HISTORY — DX: Other specified postprocedural states: R11.2

## 2011-03-20 HISTORY — DX: Headache: R51

## 2011-03-20 LAB — CBC
MCHC: 33.7 g/dL (ref 30.0–36.0)
Platelets: 300 10*3/uL (ref 150–400)
RDW: 13.9 % (ref 11.5–15.5)

## 2011-03-20 LAB — SURGICAL PCR SCREEN: Staphylococcus aureus: NEGATIVE

## 2011-03-20 LAB — BASIC METABOLIC PANEL
BUN: 14 mg/dL (ref 6–23)
Calcium: 9.8 mg/dL (ref 8.4–10.5)
Creatinine, Ser: 0.88 mg/dL (ref 0.50–1.35)
GFR calc Af Amer: >90 mL/min (ref >90)
GFR calc non Af Amer: 89 mL/min — ABNORMAL LOW (ref >90)
Potassium: 4.4 mEq/L (ref 3.5–5.1)

## 2011-03-20 NOTE — Pre-Procedure Instructions (Signed)
21 Christian Roberts  03/20/2011   Your procedure is scheduled on:  03/27/2011  Report to [facility] at 7:00 AM.  Call this number if you have problems the morning of surgery: [phone]   Remember:   Do not eat food:After Midnight.  May have clear liquids: up to 4 Hours before arrival.  Clear liquids include soda, tea, black coffee, apple or grape juice, broth.  Take these medicines the morning of surgery with A SIP OF WATER: PREVACID   Do not wear jewelry, make-up or nail polish.  Do not wear lotions, powders, or perfumes. You may wear deodorant.  Do not shave 48 hours prior to surgery.  Do not bring valuables to the hospital.  Contacts, dentures or bridgework may not be worn into surgery.  Leave suitcase in the car. After surgery it may be brought to your room.  For patients admitted to the hospital, checkout time is 11:00 AM the day of discharge.   Patients discharged the day of surgery will not be allowed to drive home.  Name and phone number of your driver: with WIFE  Special Instructions: CHG Shower Use Special Wash: 1/2 bottle night before surgery and 1/2 bottle morning of surgery.   Please read over the following fact sheets that you were given: Pain Booklet, Coughing and Deep Breathing, MRSA Information and Surgical Site Infection Prevention

## 2011-03-26 MED ORDER — CEFAZOLIN SODIUM 1-5 GM-% IV SOLN
1.0000 g | INTRAVENOUS | Status: AC
  Administered 2011-03-27: 1 g via INTRAVENOUS
  Filled 2011-03-26: qty 50

## 2011-03-27 ENCOUNTER — Encounter (HOSPITAL_COMMUNITY): Payer: Self-pay | Admitting: *Deleted

## 2011-03-27 ENCOUNTER — Ambulatory Visit (HOSPITAL_COMMUNITY): Payer: BC Managed Care – PPO

## 2011-03-27 ENCOUNTER — Ambulatory Visit (HOSPITAL_COMMUNITY)
Admission: RE | Admit: 2011-03-27 | Discharge: 2011-03-27 | Disposition: A | Discharge status: Home / Self Care | Payer: BC Managed Care – PPO | Source: Ambulatory Visit | Attending: Surgery | Admitting: Surgery

## 2011-03-27 ENCOUNTER — Encounter (HOSPITAL_COMMUNITY)
Admission: RE | Disposition: A | Discharge status: Home / Self Care | Payer: Self-pay | Source: Ambulatory Visit | Attending: Surgery

## 2011-03-27 ENCOUNTER — Encounter (HOSPITAL_COMMUNITY): Payer: Self-pay

## 2011-03-27 DIAGNOSIS — E119 Type 2 diabetes mellitus without complications: Secondary | ICD-10-CM | POA: Insufficient documentation

## 2011-03-27 DIAGNOSIS — Z01812 Encounter for preprocedural laboratory examination: Secondary | ICD-10-CM | POA: Insufficient documentation

## 2011-03-27 DIAGNOSIS — K4090 Unilateral inguinal hernia, without obstruction or gangrene, not specified as recurrent: Secondary | ICD-10-CM | POA: Insufficient documentation

## 2011-03-27 DIAGNOSIS — E785 Hyperlipidemia, unspecified: Secondary | ICD-10-CM | POA: Insufficient documentation

## 2011-03-27 DIAGNOSIS — K219 Gastro-esophageal reflux disease without esophagitis: Secondary | ICD-10-CM | POA: Insufficient documentation

## 2011-03-27 HISTORY — PX: INGUINAL HERNIA REPAIR: SHX194

## 2011-03-27 LAB — GLUCOSE, CAPILLARY: Glucose-Capillary: 121 mg/dL — ABNORMAL HIGH (ref 70–99)

## 2011-03-27 SURGERY — REPAIR, HERNIA, INGUINAL, ADULT
Anesthesia: General | Site: Groin | Laterality: Left | Wound class: Clean

## 2011-03-27 MED ORDER — 0.9 % SODIUM CHLORIDE (POUR BTL) OPTIME
TOPICAL | Status: DC | PRN
  Administered 2011-03-27: 1000 mL

## 2011-03-27 MED ORDER — HYDROMORPHONE HCL PF 1 MG/ML IJ SOLN
0.2500 mg | INTRAMUSCULAR | Status: DC | PRN
  Administered 2011-03-27 (×2): 0.5 mg via INTRAVENOUS

## 2011-03-27 MED ORDER — MIDAZOLAM HCL 5 MG/5ML IJ SOLN
INTRAMUSCULAR | Status: DC | PRN
  Administered 2011-03-27: 1 mg via INTRAVENOUS

## 2011-03-27 MED ORDER — HYDROCODONE-ACETAMINOPHEN 5-325 MG PO TABS: 1.0000 | ORAL_TABLET | ORAL | Status: AC | PRN

## 2011-03-27 MED ORDER — PROPOFOL 10 MG/ML IV EMUL
INTRAVENOUS | Status: DC | PRN
  Administered 2011-03-27: 50 mg via INTRAVENOUS
  Administered 2011-03-27: 150 mg via INTRAVENOUS

## 2011-03-27 MED ORDER — LACTATED RINGERS IV SOLN
INTRAVENOUS | Status: DC
  Administered 2011-03-27: 09:00:00 via INTRAVENOUS

## 2011-03-27 MED ORDER — LACTATED RINGERS IV SOLN
INTRAVENOUS | Status: DC | PRN
  Administered 2011-03-27: 09:00:00 via INTRAVENOUS

## 2011-03-27 MED ORDER — ONDANSETRON HCL 4 MG/2ML IJ SOLN
INTRAMUSCULAR | Status: DC | PRN
  Administered 2011-03-27: 4 mg via INTRAVENOUS

## 2011-03-27 MED ORDER — FENTANYL CITRATE 0.05 MG/ML IJ SOLN
INTRAMUSCULAR | Status: DC | PRN
  Administered 2011-03-27: 100 ug via INTRAVENOUS

## 2011-03-27 MED ORDER — BUPIVACAINE LIPOSOME 1.3 % IJ SUSP
20.0000 mL | INTRAMUSCULAR | Status: AC
  Administered 2011-03-27: 20 mL
  Filled 2011-03-27: qty 20

## 2011-03-27 MED ORDER — LIDOCAINE HCL (CARDIAC) 20 MG/ML IV SOLN
INTRAVENOUS | Status: DC | PRN
  Administered 2011-03-27: 100 mg via INTRAVENOUS

## 2011-03-27 MED ORDER — HYDROCODONE-ACETAMINOPHEN 5-325 MG PO TABS: 1.0000 | ORAL_TABLET | ORAL | Status: DC | PRN

## 2011-03-27 MED ORDER — LACTATED RINGERS IV SOLN: INTRAVENOUS | Status: DC

## 2011-03-27 MED ORDER — MORPHINE SULFATE 2 MG/ML IJ SOLN: 0.0500 mg/kg | INTRAMUSCULAR | Status: DC | PRN

## 2011-03-27 SURGICAL SUPPLY — 45 items
BENZOIN TINCTURE PRP APPL 2/3 (GAUZE/BANDAGES/DRESSINGS) ×2 IMPLANT
BLADE SURG 10 STRL SS (BLADE) ×2 IMPLANT
BLADE SURG 15 STRL LF DISP TIS (BLADE) ×1 IMPLANT
BLADE SURG 15 STRL SS (BLADE) ×1
BLADE SURG ROTATE 9660 (MISCELLANEOUS) ×2 IMPLANT
CHLORAPREP W/TINT 26ML (MISCELLANEOUS) ×2 IMPLANT
CLOTH BEACON ORANGE TIMEOUT ST (SAFETY) ×2 IMPLANT
COVER SURGICAL LIGHT HANDLE (MISCELLANEOUS) ×2 IMPLANT
DRAIN PENROSE 1/2X12 LTX STRL (WOUND CARE) ×2 IMPLANT
DRAPE LAPAROTOMY TRNSV 102X78 (DRAPE) ×2 IMPLANT
DRESSING TELFA 8X3 (GAUZE/BANDAGES/DRESSINGS) ×2 IMPLANT
DRSG TEGADERM 4X4.75 (GAUZE/BANDAGES/DRESSINGS) ×2 IMPLANT
ELECT CAUTERY BLADE 6.4 (BLADE) ×2 IMPLANT
ELECT REM PT RETURN 9FT ADLT (ELECTROSURGICAL) ×2
ELECTRODE REM PT RTRN 9FT ADLT (ELECTROSURGICAL) ×1 IMPLANT
GLOVE BIO SURGEON STRL SZ7.5 (GLOVE) ×2 IMPLANT
GLOVE BIOGEL PI IND STRL 7.5 (GLOVE) ×3 IMPLANT
GLOVE BIOGEL PI INDICATOR 7.5 (GLOVE) ×3
GLOVE SURG SIGNA 7.5 PF LTX (GLOVE) ×2 IMPLANT
GOWN PREVENTION PLUS XLARGE (GOWN DISPOSABLE) ×2 IMPLANT
GOWN STRL NON-REIN LRG LVL3 (GOWN DISPOSABLE) ×4 IMPLANT
KIT BASIN OR (CUSTOM PROCEDURE TRAY) ×2 IMPLANT
KIT ROOM TURNOVER OR (KITS) ×2 IMPLANT
MESH PARIETEX PROGRIP LEFT (Mesh General) ×2 IMPLANT
NEEDLE HYPO 25GX1X1/2 BEV (NEEDLE) ×2 IMPLANT
NS IRRIG 1000ML POUR BTL (IV SOLUTION) ×2 IMPLANT
PACK SURGICAL SETUP 50X90 (CUSTOM PROCEDURE TRAY) ×2 IMPLANT
PAD ARMBOARD 7.5X6 YLW CONV (MISCELLANEOUS) ×4 IMPLANT
PENCIL BUTTON HOLSTER BLD 10FT (ELECTRODE) ×2 IMPLANT
SPECIMEN JAR SMALL (MISCELLANEOUS) IMPLANT
SPONGE LAP 18X18 X RAY DECT (DISPOSABLE) ×2 IMPLANT
STRIP CLOSURE SKIN 1/2X4 (GAUZE/BANDAGES/DRESSINGS) ×2 IMPLANT
SUT MON AB 4-0 PC3 18 (SUTURE) ×2 IMPLANT
SUT SILK 2 0 SH (SUTURE) IMPLANT
SUT VIC AB 2-0 CT1 27 (SUTURE) ×3
SUT VIC AB 2-0 CT1 TAPERPNT 27 (SUTURE) ×3 IMPLANT
SUT VIC AB 2-0 SH 27 (SUTURE) ×1
SUT VIC AB 2-0 SH 27X BRD (SUTURE) ×1 IMPLANT
SUT VIC AB 3-0 CT1 27 (SUTURE) ×1
SUT VIC AB 3-0 CT1 TAPERPNT 27 (SUTURE) ×1 IMPLANT
SYR CONTROL 10ML LL (SYRINGE) ×2 IMPLANT
TOWEL OR 17X24 6PK STRL BLUE (TOWEL DISPOSABLE) ×2 IMPLANT
TOWEL OR 17X26 10 PK STRL BLUE (TOWEL DISPOSABLE) ×2 IMPLANT
TOWEL OR NON WOVEN STRL DISP B (DISPOSABLE) ×2 IMPLANT
WATER STERILE IRR 1000ML POUR (IV SOLUTION) IMPLANT

## 2011-03-27 NOTE — Anesthesia Procedure Notes (Signed)
Procedure Name: LMA Insertion Date/Time: 03/27/2011 9:47 AM Performed by: Carmela Rima Pre-anesthesia Checklist: Patient identified, Timeout performed, Emergency Drugs available, Suction available and Patient being monitored Patient Re-evaluated:Patient Re-evaluated prior to inductionOxygen Delivery Method: Circle system utilized Preoxygenation: Pre-oxygenation with 100% oxygen Intubation Type: IV induction LMA: LMA inserted LMA Size: 4.0 Tube type: Oral Number of attempts: 1 Placement Confirmation: breath sounds checked- equal and bilateral and positive ETCO2 Tube secured with: Tape Dental Injury: Teeth and Oropharynx as per pre-operative assessment

## 2011-03-27 NOTE — Transfer of Care (Signed)
Immediate Anesthesia Transfer of Care Note  Patient: Christian Roberts  Procedure(s) Performed: Procedure(s) (LRB): HERNIA REPAIR INGUINAL ADULT (Left) INSERTION OF MESH (Left)  Patient Location: PACU  Anesthesia Type: General  Level of Consciousness: awake, alert  and oriented  Airway & Oxygen Therapy: Patient Spontanous Breathing and Patient connected to nasal cannula oxygen  Post-op Assessment: Report given to PACU RN and Post -op Vital signs reviewed and stable  Post vital signs: Reviewed and stable  Complications: No apparent anesthesia complications

## 2011-03-27 NOTE — Anesthesia Postprocedure Evaluation (Signed)
  Anesthesia Post-op Note  Patient: Christian Roberts  Procedure(s) Performed: Procedure(s) (LRB): HERNIA REPAIR INGUINAL ADULT (Left) INSERTION OF MESH (Left)  Patient Location: PACU  Anesthesia Type: General  Level of Consciousness: awake  Airway and Oxygen Therapy: Patient Spontanous Breathing  Post-op Pain: mild  Post-op Assessment: Post-op Vital signs reviewed  Post-op Vital Signs: stable  Complications: No apparent anesthesia complications

## 2011-03-27 NOTE — Interval H&P Note (Signed)
History and Physical Interval Note: no change in history or exam  03/27/2011 7:51 AM  Christian Roberts  has presented today for surgery, with the diagnosis of left inguinal hernia  The various methods of treatment have been discussed with the patient and family. After consideration of risks, benefits and other options for treatment, the patient has consented to  Procedure(s) (LRB): HERNIA REPAIR INGUINAL ADULT (Left) INSERTION OF MESH (N/A) as a surgical intervention .  The patients' history has been reviewed, patient examined, no change in status, stable for surgery.  I have reviewed the patients' chart and labs.  Questions were answered to the patient's satisfaction.     Jodelle Fausto A

## 2011-03-27 NOTE — Op Note (Signed)
03/27/2011  10:33 AM  PATIENT:  Christian Roberts  66 y.o. male  PRE-OPERATIVE DIAGNOSIS:  left inguinal hernia  POST-OPERATIVE DIAGNOSIS:  left inguinal hernia  PROCEDURE:  Procedure(s) (LRB): HERNIA REPAIR INGUINAL ADULT (Left) INSERTION OF MESH (Left)  SURGEON:  Surgeon(s) and Role:    * Shelly Rubenstein, MD - Primary  PHYSICIAN ASSISTANT:   ASSISTANTS: 0   ANESTHESIA:   general  EBL:     BLOOD ADMINISTERED:none  DRAINS: none   LOCAL MEDICATIONS USED:  BUPIVICAINE   SPECIMEN:  No Specimen  DISPOSITION OF SPECIMEN:  N/A  COUNTS:  YES  TOURNIQUET:  * No tourniquets in log *  DICTATION: .Dragon Dictation Indications: This is a 66 year old with a symptomatic left inguinal hernia. The decision has been made to proceed.  Findings: The patient had a large direct hernia defect which basically obliteration of the left inguinal floor  Procedure: The patient was brought to the operating room and identified the correct patient. He was placed on the operating table and general anesthesia was induced. His abdomen was prepped and draped in the sterile fashion. I anesthetized the skin with bupivacaine and performed a left ilioinguinal nerve block as well. I then made a longitudinal incision with the scalpel. I did this down to the fascia with electrocautery. External oblique fascia identified and opened the internal and external rings. The patient was found to have a very large direct hernia defect with the floor of the groin completely gone. There was no evidence of indirect defect. I reduced the entire sac and then imbricated tissue to semi-reconstruct the floor. I then brought a piece of Prolene program mesh onto the field and placed it into the inguinal floor. I then secured it to the tubercle of the pubis with 0 Vicryl suture. This appeared to reconstruct the floor and protect the cord structures. I then closed external bleak fascia over the top of this with running 2-0 Vicryl  suture. I anesthetized the fascia further with the bupivacaine. I then closed the subcutaneous tissue with interrupted 3-0 Vicryl sutures and closed the skin with a running 4-0 Monocryl suture. Steri-Strips and gauze were applied. The patient procedure well. All counts were correct at the end of the procedure. The patient was then extubated and taken to the recovery room. PLAN OF CARE: Discharge to home after PACU  PATIENT DISPOSITION:  PACU - hemodynamically stable.   Delay start of Pharmacological VTE agent (>24hrs) due to surgical blood loss or risk of bleeding: not applicable

## 2011-03-27 NOTE — H&P (Signed)
.  Pre-op Exam     eval [facility]    HPI  Christian Roberts is a 66 y.o. male.  HPIThis is a very pleasant gentleman referred by Dr. Caryl Never for evaluation of a symptomatic left inguinal hernia. His hernia has been present for several years but has been tiny and asymptomatic until recently when he was doing heavy lifting. Now the hernia has become painful. He has no obstructive symptoms. He reports that it will reduce when he lies down. The pain is an 8 and is mild to moderate in intensity.  Past Medical History   Diagnosis  Date   .  Allergic rhinitis    .  Osteoarthritis    .  Elevated lipids    .  GERD (gastroesophageal reflux disease)    .  Hyperlipidemia    .  Diabetes mellitus    .  Chills    .  Fatigue    .  Wears dentures    .  Cancer    .  Asthma    .  Cough    .  Shortness of breath    .  Bronchitis    .  Nausea vomiting and diarrhea    .  Abdominal pain    .  Rectal bleeding    .  Hernia    .  Wears glasses    .  Blood in urine    .  Difficulty urinating    .  Weakness     Past Surgical History   Procedure  Date   .  Ankle surgery    .  Esophagogastroduodenoscopy  06/27/2006   .  Appendectomy    .  Tonsillectomy    .  Varicocelectomy    .  Left ankle mass removed  1972   .  Neck surgery  03/11    Family History   Problem  Relation  Age of Onset   .  Stroke  Mother    .  Cerebral aneurysm  Father    .  Diabetes  Sister    .  Breast cancer  Sister    .  Breast cancer  Sister     Social History  History   Substance Use Topics   .  Smoking status:  Current Some Day Smoker -- 0.5 packs/day     Types:  Cigarettes   .  Smokeless tobacco:  Never Used   .  Alcohol Use:  No    Allergies   Allergen  Reactions   .  Aspirin      REACTION: unspecified   .  Codeine Phosphate      REACTION: unspecified   .  Ibuprofen      REACTION: unspecified   .  Levofloxacin      REACTION: unspecified   .  Morphine Sulfate      REACTION: unspecified   .  Naproxen Sodium       REACTION: unspecified   .  Sulfamethoxazole      REACTION: unspecified    Current Outpatient Prescriptions   Medication  Sig  Dispense  Refill   .  albuterol (PROAIR HFA) 108 (90 BASE) MCG/ACT inhaler  Inhale 2 puffs into the lungs every 6 (six) hours as needed.     .  diphenhydrAMINE (BENADRYL) 25 MG tablet  Take 25 mg by mouth every 6 (six) hours as needed.     .  fish oil-omega-3 fatty acids 1000 MG capsule  Take 2  g by mouth daily.     .  lansoprazole (PREVACID) 30 MG capsule  Take 30 mg by mouth daily.     .  simvastatin (ZOCOR) 20 MG tablet  Take 0.5 tablets (10 mg total) by mouth at bedtime.  90 tablet  3   .  Tamsulosin HCl (FLOMAX) 0.4 MG CAPS  Take by mouth.     .  solifenacin (VESICARE) 5 MG tablet  Take 10 mg by mouth daily.      Review of Systems  Review of Systems  Constitutional: Positive for chills.  Eyes: Negative.  Respiratory: Positive for cough and shortness of breath.  Cardiovascular: Negative.  Gastrointestinal: Positive for nausea and abdominal pain. Negative for vomiting and abdominal distention.  Genitourinary: Positive for hematuria and difficulty urinating.  Musculoskeletal: Positive for arthralgias.  Skin: Negative.  Neurological: Positive for light-headedness and headaches.  Hematological: Negative.  Psychiatric/Behavioral: Negative.   Blood pressure 132/68, pulse 96, temperature 98.4 F (36.9 C), temperature source Temporal, height 5\' 4"  (1.626 m), weight 132 lb 12.8 oz (60.238 kg), SpO2 95.00%.  Physical Exam  Physical Exam  Constitutional: He is oriented to person, place, and time. He appears well-developed and well-nourished. No distress.  HENT:  Head: Normocephalic and atraumatic.  Right Ear: External ear normal.  Left Ear: External ear normal.  Nose: Nose normal.  Mouth/Throat: No oropharyngeal exudate.  Eyes: Conjunctivae and EOM are normal. Pupils are equal, round, and reactive to light. No scleral icterus.  Neck: Neck supple. No  tracheal deviation present. No thyromegaly present.  Cardiovascular: Normal rate, regular rhythm, normal heart sounds and intact distal pulses.  No murmur heard.  Pulmonary/Chest: Effort normal and breath sounds normal. No respiratory distress. He has no wheezes. He has no rales.  Abdominal: Soft. Bowel sounds are normal. He exhibits no distension. There is no tenderness. There is no rebound and no guarding.  There is an easily reducible left inguinal hernia. There is no right inguinal hernia or umbilical hernia  Musculoskeletal: Normal range of motion. He exhibits no edema and no tenderness.  Lymphadenopathy:  He has no cervical adenopathy.  Neurological: He is alert and oriented to person, place, and time.  Skin: Skin is warm and dry. No rash noted. No erythema.  Psychiatric: His behavior is normal. Judgment normal.   Data Reviewed  Assessment   Easily reducible left inguinal hernia  Plan   Repair with mesh was recommended. I discussed that the laparoscopic and open techniques. He has had a problem with urinary retention with his previous surgeries. I am recommending we do this open with minimal sedation. I discussed the risk of surgery which includes but is not limited to bleeding, infection, injury to surrounding structures, chronic pain, nerve entrapment, recurrence, etc. He understands and wished to proceed. Surgery will be scheduled. Likelihood of success is good   [name]

## 2011-03-27 NOTE — Anesthesia Preprocedure Evaluation (Addendum)
Anesthesia Evaluation  Patient identified by MRN, date of birth, ID band  Reviewed: Allergy & Precautions, H&P , NPO status , Patient's Chart, lab work & pertinent test results, reviewed documented beta blocker date and time   History of Anesthesia Complications (+) PONV  Airway Mallampati: II      Dental  (+) Dental Advidsory Given   Pulmonary shortness of breath and with exertion, asthma ,  breath sounds clear to auscultation        Cardiovascular Rate:Normal     Neuro/Psych  Headaches,    GI/Hepatic Neg liver ROS, GERD-  ,  Endo/Other  Diabetes mellitus-, Type 2  Renal/GU negative Renal ROS     Musculoskeletal negative musculoskeletal ROS (+)   Abdominal   Peds  Hematology negative hematology ROS (+)   Anesthesia Other Findings   Reproductive/Obstetrics                          Anesthesia Physical Anesthesia Plan  ASA: III  Anesthesia Plan: General   Post-op Pain Management:    Induction: Intravenous  Airway Management Planned: LMA  Additional Equipment:   Intra-op Plan:   Post-operative Plan: Extubation in OR  Informed Consent:   Dental Advisory Given  Plan Discussed with: CRNA, Anesthesiologist and Surgeon  Anesthesia Plan Comments:        Anesthesia Quick Evaluation

## 2011-03-27 NOTE — Preoperative (Signed)
Beta Blockers   Reason not to administer Beta Blockers:Not Applicable 

## 2011-03-27 NOTE — Discharge Instructions (Signed)
[facility] _______[facility]  UMBILICAL OR INGUINAL HERNIA REPAIR: POST OP INSTRUCTIONS  Always review your discharge instruction sheet given to you by the facility where your surgery was performed. IF YOU HAVE DISABILITY OR FAMILY LEAVE FORMS, YOU MUST BRING THEM TO THE OFFICE FOR PROCESSING.   DO NOT GIVE THEM TO YOUR DOCTOR.  1. A  prescription for pain medication may be given to you upon discharge.  Take your pain medication as prescribed, if needed.  If narcotic pain medicine is not needed, then you may take acetaminophen (Tylenol) or ibuprofen (Advil) as needed. 2. Take your usually prescribed medications unless otherwise directed. 3. If you need a refill on your pain medication, please contact your pharmacy.  They will contact our office to request authorization. Prescriptions will not be filled after 5 pm or on week-ends. 4. You should follow a light diet the first 24 hours after arrival home, such as soup and crackers, etc.  Be sure to include lots of fluids daily.  Resume your normal diet the day after surgery. 5. Most patients will experience some swelling and bruising around the umbilicus or in the groin and scrotum.  Ice packs and reclining will help.  Swelling and bruising can take several days to resolve.  6. It is common to experience some constipation if taking pain medication after surgery.  Increasing fluid intake and taking a stool softener (such as Colace) will usually help or prevent this problem from occurring.  A mild laxative (Milk of Magnesia or Miralax) should be taken according to package directions if there are no bowel movements after 48 hours. 7. Unless discharge instructions indicate otherwise, you may remove your bandages 24-48 hours after surgery, and you may shower at that time.  You may have steri-strips (small skin tapes) in place directly over the incision.  These strips should be left on the skin for 7-10 days.  If your surgeon used skin glue on the  incision, you may shower in 24 hours.  The glue will flake off over the next 2-3 weeks.  Any sutures or staples will be removed at the office during your follow-up visit. 8. ACTIVITIES:  You may resume regular (light) daily activities beginning the next day--such as daily self-care, walking, climbing stairs--gradually increasing activities as tolerated.  You may have sexual intercourse when it is comfortable.  Refrain from any heavy lifting or straining until approved by your doctor. a. You may drive when you are no longer taking prescription pain medication, you can comfortably wear a seatbelt, and you can safely maneuver your car and apply brakes. b. RETURN TO WORK:  __________________________________________________________ 9. You should see your doctor in the office for a follow-up appointment approximately 2-3 weeks after your surgery.  Make sure that you call for this appointment within a day or two after you arrive home to insure a convenient appointment time. 10. OTHER INSTRUCTIONS:  __________________________________________________________________________________________________________________________________________________________________________________________  WHEN TO CALL YOUR DOCTOR: 1. Fever over 101.0 2. Inability to urinate 3. Nausea and/or vomiting 4. Extreme swelling or bruising 5. Continued bleeding from incision. 6. Increased pain, redness, or drainage from the incision  The clinic staff is available to answer your questions during regular business hours.  Please don't hesitate to call and ask to speak to one of the nurses for clinical concerns.  If you have a medical emergency, go to the nearest emergency room or call 911.  A surgeon from [facility] is always on call at the hospital     [address], [address], [place], [place]  [address] ?  [address] [address], [place], [place]   [address] [phone] ? [phone] ? FAX [phone] Web site:  [email or web address]  

## 2011-03-28 ENCOUNTER — Telehealth (INDEPENDENT_AMBULATORY_CARE_PROVIDER_SITE_OTHER): Payer: Self-pay | Admitting: General Surgery

## 2011-03-28 NOTE — Telephone Encounter (Signed)
Christian Roberts calling with generalized questions about bathing in 48 hours and the surgical bandage.  Informed to remove bandage before bathing and okay to leave it off following bathing unless there is a area that is draining.  Also made his post op appt.

## 2011-03-29 ENCOUNTER — Encounter (HOSPITAL_COMMUNITY): Payer: Self-pay | Admitting: Surgery

## 2011-04-17 ENCOUNTER — Encounter (INDEPENDENT_AMBULATORY_CARE_PROVIDER_SITE_OTHER): Payer: Self-pay | Admitting: Surgery

## 2011-04-17 ENCOUNTER — Ambulatory Visit (INDEPENDENT_AMBULATORY_CARE_PROVIDER_SITE_OTHER): Payer: BC Managed Care – PPO | Admitting: Surgery

## 2011-04-17 VITALS — BP 125/83 | HR 83 | Temp 98.4°F | Resp 16 | Ht 64.0 in | Wt 128.2 lb

## 2011-04-17 DIAGNOSIS — Z09 Encounter for follow-up examination after completed treatment for conditions other than malignant neoplasm: Secondary | ICD-10-CM

## 2011-04-17 NOTE — Progress Notes (Signed)
Subjective:     Patient ID: Christian Roberts, male   DOB: 06-Jul-1945, 66 y.o.   MRN: [id number]  HPI He is here for his first postop visit status post left inguinal hernia repair with mesh. He is doing moderately well  Review of Systems     Objective:   Physical Exam On exam, his incision is well-healed there is no evidence of recurrence    Assessment:     Patient stable status post left inguinal hernia repair with mesh    Plan:     He will refrain from heavy lifting for one more week. I will see him back as needed

## 2011-05-07 ENCOUNTER — Other Ambulatory Visit (INDEPENDENT_AMBULATORY_CARE_PROVIDER_SITE_OTHER): Payer: BC Managed Care – PPO

## 2011-05-07 DIAGNOSIS — E785 Hyperlipidemia, unspecified: Secondary | ICD-10-CM

## 2011-05-07 LAB — HEPATIC FUNCTION PANEL
AST: 19 U/L (ref 0–37)
Albumin: 4.2 g/dL (ref 3.5–5.2)
Alkaline Phosphatase: 59 U/L (ref 39–117)
Bilirubin, Direct: 0 mg/dL (ref 0.0–0.3)
Total Protein: 7 g/dL (ref 6.0–8.3)

## 2011-05-07 LAB — BASIC METABOLIC PANEL
CO2: 29 mEq/L (ref 19–32)
Calcium: 9.9 mg/dL (ref 8.4–10.5)
Creatinine, Ser: 1.1 mg/dL (ref 0.4–1.5)
GFR: 73.04 mL/min (ref >60.00)
Sodium: 145 mEq/L (ref 135–145)

## 2011-05-07 LAB — LIPID PANEL
Cholesterol: 167 mg/dL (ref 0–200)
LDL Cholesterol: 98 mg/dL (ref 0–99)
Triglycerides: 94 mg/dL (ref 0.0–149.0)

## 2011-05-09 ENCOUNTER — Other Ambulatory Visit: Payer: BC Managed Care – PPO

## 2011-05-16 ENCOUNTER — Ambulatory Visit (INDEPENDENT_AMBULATORY_CARE_PROVIDER_SITE_OTHER): Payer: BC Managed Care – PPO | Admitting: Internal Medicine

## 2011-05-16 ENCOUNTER — Encounter: Payer: Self-pay | Admitting: Internal Medicine

## 2011-05-16 VITALS — BP 122/74 | HR 80 | Temp 97.9°F | Wt 130.0 lb

## 2011-05-16 DIAGNOSIS — E785 Hyperlipidemia, unspecified: Secondary | ICD-10-CM

## 2011-05-16 DIAGNOSIS — R7989 Other specified abnormal findings of blood chemistry: Secondary | ICD-10-CM

## 2011-05-16 DIAGNOSIS — R7309 Other abnormal glucose: Secondary | ICD-10-CM

## 2011-05-16 NOTE — Progress Notes (Signed)
Multiple complaints  Chronic ankle pain   he complains of polyuria and polydipsia, he also has lost weight.   Past Medical History  Diagnosis Date  . Allergic rhinitis   . Elevated lipids   . GERD (gastroesophageal reflux disease)   . Hyperlipidemia   . Chills   . Fatigue   . Wears dentures   . Cough   . Shortness of breath   . Bronchitis   . Nausea vomiting and diarrhea   . Abdominal pain   . Rectal bleeding   . Hernia   . Wears glasses   . Blood in urine   . Difficulty urinating   . Weakness   . PONV (postoperative nausea and vomiting)     in the 50's & 60's, had N&V with rubber mask  . Diabetes mellitus     diet controlled - since 2012  . Headache     headaches-related to sinuses & uses Tylenol   . Cancer     skin- basal cell  . Osteoarthritis     ankle- left, hands & neck & hip  . Neuromuscular disorder     burning in both hands & fingers  . Asthma     History   Social History  . Marital Status: Married    Spouse Name: N/A    Number of Children: N/A  . Years of Education: N/A   Occupational History  . Not on file.   Social History Main Topics  . Smoking status: Current Some Day Smoker -- 0.5 packs/day    Types: Cigarettes  . Smokeless tobacco: Never Used  . Alcohol Use: No  . Drug Use: No  . Sexually Active: Not on file   Other Topics Concern  . Not on file   Social History Narrative   MarriedDaily caffeine use- 6Patient does not get regular exercise    Past Surgical History  Procedure Date  . Ankle surgery   . Esophagogastroduodenoscopy 06/27/2006  . Tonsillectomy   . Varicocelectomy   . Left ankle mass removed 1972    3 surgeries on L ankle- 73, 2008, 2009  . Neck surgery 03/11  . Back surgery     cerv. fusion C1- T1, 2 separate surgeries   . Appendectomy     1964  . Cystoscopy     multiple times for infections  . Inguinal hernia repair 03/27/2011    Procedure: HERNIA REPAIR INGUINAL ADULT;  Surgeon: Shelly Rubenstein, MD;   Location: [facility] OR;  Service: General;  Laterality: Left;    Family History  Problem Relation Age of Onset  . Stroke Mother   . Anesthesia problems Mother   . Cerebral aneurysm Father   . Diabetes Sister   . Breast cancer Sister   . Breast cancer Sister     Allergies  Allergen Reactions  . Aspirin Anaphylaxis  . Codeine Phosphate Anaphylaxis  . Ibuprofen Anaphylaxis  . Morphine Sulfate Anaphylaxis  . Naproxen Sodium Anaphylaxis  . Nsaids Anaphylaxis  . Sulfamethoxazole Anaphylaxis  . Levofloxacin Itching    REACTION: unspecified    Current Outpatient Prescriptions on File Prior to Visit  Medication Sig Dispense Refill  . acetaminophen (TYLENOL) 500 MG tablet Take 500 mg by mouth every 6 (six) hours as needed.      Marland Kitchen albuterol (PROAIR HFA) 108 (90 BASE) MCG/ACT inhaler Inhale 2 puffs into the lungs every 4 (four) hours as needed. For shortness of breath      . diphenhydrAMINE (BENADRYL) 25 MG tablet  Take 25 mg by mouth every 6 (six) hours as needed. For itching      . fish oil-omega-3 fatty acids 1000 MG capsule Take 1 g by mouth 2 (two) times daily.       . fluticasone (FLONASE) 50 MCG/ACT nasal spray Place 2 sprays into the nose daily.      . lansoprazole (PREVACID) 30 MG capsule Take 30 mg by mouth every morning.       . polycarbophil (FIBERCON) 625 MG tablet Take 625 mg by mouth daily at 2 PM daily at 2 PM.      . pseudoephedrine (SUDAFED) 30 MG tablet Take 30 mg by mouth daily. For allergies      . simvastatin (ZOCOR) 20 MG tablet Take 0.5 tablets (10 mg total) by mouth at bedtime.  90 tablet  3  . solifenacin (VESICARE) 5 MG tablet Take 5 mg by mouth every morning.       . Tamsulosin HCl (FLOMAX) 0.4 MG CAPS Take 0.4 mg by mouth daily at 2 PM daily at 2 PM.          patient denies chest pain, shortness of breath, orthopnea. Denies lower extremity edema, abdominal pain, change in appetite, change in bowel movements. Patient denies rashes, musculoskeletal complaints. No other  specific complaints in a complete review of systems.   BP 122/74  Pulse 80  Temp(Src) 97.9 F (36.6 C) (Oral)  Wt 130 lb (58.968 kg)  well-developed well-nourished male in no acute distress. HEENT exam atraumatic, normocephalic, neck supple without jugular venous distention. Chest clear to auscultation cardiac exam S1-S2 are regular. Abdominal exam overweight with bowel sounds, soft and nontender. Extremities no edema. Neurologic exam is alert with a normal gait.

## 2011-05-16 NOTE — Patient Instructions (Addendum)
Stop smoking--- consider tapering use of cigarrettes

## 2011-05-19 NOTE — Assessment & Plan Note (Signed)
Lipids are adequately controlled.

## 2011-05-19 NOTE — Assessment & Plan Note (Signed)
Lab Results  Component Value Date   HGBA1C 6.6* 05/07/2011   A1c is okay. I don't think his polyuria and polydipsia are related to diabetes.

## 2011-05-22 ENCOUNTER — Other Ambulatory Visit: Payer: BC Managed Care – PPO

## 2011-05-29 ENCOUNTER — Ambulatory Visit: Payer: BC Managed Care – PPO | Admitting: Internal Medicine

## 2011-06-13 ENCOUNTER — Emergency Department (HOSPITAL_BASED_OUTPATIENT_CLINIC_OR_DEPARTMENT_OTHER)
Admission: EM | Admit: 2011-06-13 | Discharge: 2011-06-13 | Disposition: A | Discharge status: Home / Self Care | Payer: BC Managed Care – PPO | Attending: Emergency Medicine | Admitting: Emergency Medicine

## 2011-06-13 ENCOUNTER — Encounter (HOSPITAL_BASED_OUTPATIENT_CLINIC_OR_DEPARTMENT_OTHER): Payer: Self-pay | Admitting: *Deleted

## 2011-06-13 DIAGNOSIS — F17200 Nicotine dependence, unspecified, uncomplicated: Secondary | ICD-10-CM | POA: Insufficient documentation

## 2011-06-13 DIAGNOSIS — E785 Hyperlipidemia, unspecified: Secondary | ICD-10-CM | POA: Insufficient documentation

## 2011-06-13 DIAGNOSIS — W19XXXA Unspecified fall, initial encounter: Secondary | ICD-10-CM | POA: Insufficient documentation

## 2011-06-13 DIAGNOSIS — T148XXA Other injury of unspecified body region, initial encounter: Secondary | ICD-10-CM

## 2011-06-13 DIAGNOSIS — Z85828 Personal history of other malignant neoplasm of skin: Secondary | ICD-10-CM | POA: Insufficient documentation

## 2011-06-13 DIAGNOSIS — S40029A Contusion of unspecified upper arm, initial encounter: Secondary | ICD-10-CM | POA: Insufficient documentation

## 2011-06-13 DIAGNOSIS — K219 Gastro-esophageal reflux disease without esophagitis: Secondary | ICD-10-CM | POA: Insufficient documentation

## 2011-06-13 DIAGNOSIS — E119 Type 2 diabetes mellitus without complications: Secondary | ICD-10-CM | POA: Insufficient documentation

## 2011-06-13 NOTE — ED Notes (Signed)
Christian Roberts last week and hit his right shoulder. Yesterday he noticed a bruise on his right upper arm. Today it is painful and swollen.

## 2011-06-13 NOTE — Discharge Instructions (Signed)
Hematoma   A hematoma is a pocket of blood that collects under the skin, in an organ, in a body space, in a joint space, or in other tissue. The blood can clot to form a lump that you can see and feel. The lump is often firm, sore, and sometimes even painful and tender. Most hematomas get better in a few days to weeks. However, some hematomas may be serious and require medical care. Hematomas can range in size from very small to very large.   CAUSES   A hematoma can be caused by a blunt or penetrating injury. It can also be caused by leakage from a blood vessel under the skin. Spontaneous leakage from a blood vessel is more likely to occur in elderly people, especially those taking blood thinners. Sometimes, a hematoma can develop after certain medical procedures.   SYMPTOMS   Unlike a bruise, a hematoma forms a firm lump that you can feel. This lump is the collection of blood. The collection of blood can also cause your skin to turn a blue to dark blue color. If the hematoma is close to the surface of the skin, it often produces a yellowish color in the skin.   DIAGNOSIS   Your caregiver can determine whether you have a hematoma based on your history and a physical exam.   TREATMENT   Hematomas usually go away on their own over time. Rarely does the blood need to be drained out of the body.   HOME CARE INSTRUCTIONS   Put ice on the injured area.   Put ice in a plastic bag.   Place a towel between your skin and the bag.   Leave the ice on for 15 to 20 minutes, 3 to 4 times a day for the first 1 to 2 days.   After the first 2 days, switch to using warm compresses on the hematoma.   Elevate the injured area to help decrease pain and swelling. Wrapping the area with an elastic bandage may also be helpful. Compression helps to reduce swelling and promotes shrinking of the hematoma. Make sure the bandage is not wrapped too tight.   If your hematoma is on a lower extremity and is painful, crutches may be helpful for a  couple days.   Only take over-the-counter or prescription medicines for pain, discomfort, or fever as directed by your caregiver. Most patients can take acetaminophen or ibuprofen for the pain.   SEEK IMMEDIATE MEDICAL CARE IF:   You have increasing pain, or your pain is not controlled with medicine.   You have a fever.   You have worsening swelling or discoloration.   Your skin over the hematoma breaks or starts bleeding.   MAKE SURE YOU:   Understand these instructions.   Will watch your condition.   Will get help right away if you are not doing well or get worse.   Document Released: 08/08/2003 Document Revised: 12/13/2010 Document Reviewed: 08/27/2010   Exit[employer]® Patient Information ©2012 [employer].

## 2011-06-13 NOTE — ED Provider Notes (Signed)
History     CSN: [id number]  Arrival date & time 06/13/11  2130   First MD Initiated Contact with Patient 06/13/11 2227      Chief Complaint  Patient presents with  . Fall    (Consider location/radiation/quality/duration/timing/severity/associated sxs/prior treatment) The history is provided by the patient. No language interpreter was used.  patient presents with bruising and swelling to the right biceps and upper arm for the past 24 hours. He has no known injury. He had a fall approximately one week ago where he injured his right shoulder but knows of no other injury. Denies lifting heavy objects. Is not on any blood thinning medications. He has no associated pain. Denies chest pain, shortness of breath, abdominal pain, nausea, vomiting.  Past Medical History  Diagnosis Date  . Allergic rhinitis   . Elevated lipids   . GERD (gastroesophageal reflux disease)   . Hyperlipidemia   . Chills   . Fatigue   . Wears dentures   . Cough   . Shortness of breath   . Bronchitis   . Nausea vomiting and diarrhea   . Abdominal pain   . Rectal bleeding   . Hernia   . Wears glasses   . Blood in urine   . Difficulty urinating   . Weakness   . PONV (postoperative nausea and vomiting)     in the 50's & 60's, had N&V with rubber mask  . Diabetes mellitus     diet controlled - since 2012  . Headache     headaches-related to sinuses & uses Tylenol   . Cancer     skin- basal cell  . Osteoarthritis     ankle- left, hands & neck & hip  . Neuromuscular disorder     burning in both hands & fingers  . Asthma     Past Surgical History  Procedure Date  . Ankle surgery   . Esophagogastroduodenoscopy 06/27/2006  . Tonsillectomy   . Varicocelectomy   . Left ankle mass removed 1972    3 surgeries on L ankle- 73, 2008, 2009  . Neck surgery 03/11  . Back surgery     cerv. fusion C1- T1, 2 separate surgeries   . Appendectomy     1964  . Cystoscopy     multiple times for infections  .  Inguinal hernia repair 03/27/2011    Procedure: HERNIA REPAIR INGUINAL ADULT;  Surgeon: Shelly Rubenstein, MD;  Location: [facility] OR;  Service: General;  Laterality: Left;    Family History  Problem Relation Age of Onset  . Stroke Mother   . Anesthesia problems Mother   . Cerebral aneurysm Father   . Diabetes Sister   . Breast cancer Sister   . Breast cancer Sister     History  Substance Use Topics  . Smoking status: Current Some Day Smoker -- 0.5 packs/day    Types: Cigarettes  . Smokeless tobacco: Never Used  . Alcohol Use: No      Review of Systems  Constitutional: Negative for fever, chills, activity change, appetite change and fatigue.  HENT: Negative for congestion, sore throat, rhinorrhea, neck pain and neck stiffness.   Respiratory: Negative for cough and shortness of breath.   Cardiovascular: Negative for chest pain and palpitations.  Gastrointestinal: Negative for nausea, vomiting and abdominal pain.  Genitourinary: Negative for dysuria, urgency, frequency and flank pain.  Musculoskeletal: Negative for myalgias, back pain and arthralgias.  Skin: Positive for color change. Negative for wound.  Neurological:  Negative for dizziness, weakness, light-headedness, numbness and headaches.  All other systems reviewed and are negative.    Allergies  Aspirin; Codeine phosphate; Ibuprofen; Morphine sulfate; Naproxen sodium; Nsaids; Sulfamethoxazole; and Levofloxacin  Home Medications   Current Outpatient Rx  Name Route Sig Dispense Refill  . ACETAMINOPHEN 500 MG PO TABS Oral Take 500-1,000 mg by mouth every 6 (six) hours as needed. For pain    . OMEGA-3 FATTY ACIDS 1000 MG PO CAPS Oral Take 1 g by mouth 2 (two) times daily.     Marland Kitchen FLUTICASONE PROPIONATE 50 MCG/ACT NA SUSP Nasal Place 2 sprays into the nose daily.    Marland Kitchen LANSOPRAZOLE 30 MG PO CPDR Oral Take 30 mg by mouth every morning.     Marland Kitchen LIDOCAINE 5 % EX PTCH Transdermal Place 0.5 patches onto the skin daily. Remove &  Discard patch within 12 hours or as directed by MD    . CALCIUM POLYCARBOPHIL 625 MG PO TABS Oral Take 625 mg by mouth daily.    Marland Kitchen PSEUDOEPHEDRINE HCL 30 MG PO TABS Oral Take 30 mg by mouth every 4 (four) hours as needed. For congestion    . SIMVASTATIN 20 MG PO TABS Oral Take 0.5 tablets (10 mg total) by mouth at bedtime. 90 tablet 3  . SALINE NASAL SPRAY 0.65 % NA SOLN Nasal Place 1 spray into the nose daily as needed. For nasal congestion    . SOLIFENACIN SUCCINATE 5 MG PO TABS Oral Take 5 mg by mouth every morning.     Marland Kitchen TAMSULOSIN HCL 0.4 MG PO CAPS Oral Take 0.4 mg by mouth daily.    . ALBUTEROL SULFATE HFA 108 (90 BASE) MCG/ACT IN AERS Inhalation Inhale 2 puffs into the lungs every 4 (four) hours as needed. For shortness of breath      BP 105/62  Pulse 74  Temp(Src) 98.4 F (36.9 C) (Oral)  Resp 18  SpO2 97%  Physical Exam  Nursing note and vitals reviewed. Constitutional: He is oriented to person, place, and time. He appears well-developed and well-nourished. No distress.  HENT:  Head: Normocephalic and atraumatic.  Mouth/Throat: Oropharynx is clear and moist. No oropharyngeal exudate.  Eyes: Conjunctivae and EOM are normal. Pupils are equal, round, and reactive to light.  Neck: Normal range of motion. Neck supple.  Cardiovascular: Normal rate, regular rhythm, normal heart sounds and intact distal pulses.  Exam reveals no gallop and no friction rub.   No murmur heard. Pulmonary/Chest: Effort normal and breath sounds normal. No respiratory distress. He exhibits no tenderness.  Abdominal: Soft. Bowel sounds are normal. There is no tenderness. There is no rebound and no guarding.  Musculoskeletal: Normal range of motion. He exhibits no edema and no tenderness.       Palpable radial and ulnar pulses distally in the right upper terminate. There is good capillary refill in the right upper extremity. No concern for DVT to the right upper extremity.  Neurological: He is alert and  oriented to person, place, and time. No cranial nerve deficit.  Skin: Skin is warm and dry.       Ecchymosis present on the right biceps with associated hematoma.    ED Course  Procedures (including critical care time)  Labs Reviewed - No data to display No results found.   1. Hematoma       MDM  Hematoma to the right biceps. No significant pain associated. There is no evidence of trauma elsewhere to the arm. He will be discharged  home with instructions to apply ice for 2 days and heat thereafter. Instructed to be followed up in one week by his primary care physician. He is not on blood thinning medications therefore there is no indication for laboratory studies at this time. I have no concern about additional etiology such as blood clots to the arterial or venous system as he has good capillary refill, pulses distally.        Dayton Bailiff, MD 06/13/11 2320

## 2011-06-25 ENCOUNTER — Telehealth: Payer: Self-pay | Admitting: Internal Medicine

## 2011-06-25 ENCOUNTER — Ambulatory Visit (INDEPENDENT_AMBULATORY_CARE_PROVIDER_SITE_OTHER): Payer: BC Managed Care – PPO | Admitting: Internal Medicine

## 2011-06-25 ENCOUNTER — Encounter: Payer: Self-pay | Admitting: Internal Medicine

## 2011-06-25 VITALS — BP 102/64 | Temp 97.9°F | Wt 133.0 lb

## 2011-06-25 DIAGNOSIS — S46219A Strain of muscle, fascia and tendon of other parts of biceps, unspecified arm, initial encounter: Secondary | ICD-10-CM

## 2011-06-25 DIAGNOSIS — S43499A Other sprain of unspecified shoulder joint, initial encounter: Secondary | ICD-10-CM

## 2011-06-25 DIAGNOSIS — S46819A Strain of other muscles, fascia and tendons at shoulder and upper arm level, unspecified arm, initial encounter: Secondary | ICD-10-CM

## 2011-06-25 NOTE — Telephone Encounter (Signed)
Caller: Marino/Patient is calling with a question about Unknown Topical RX. The medication was written by Birdie Sons. Seen in office 06/25/11.  MD was going to order a topical RX for itchy skin lesion on back.  RX not received at [facility].  Aware MD is not in office during afternoon. Requests RX be sent to different pharmacy [facility] [phone]. Advised info noted and sent to MD for follow up in 24 hrs for adult with questions about RX med not covered by available resources per Med Question Call Guideline.

## 2011-06-25 NOTE — Progress Notes (Signed)
Patient ID: Christian Roberts, male   DOB: 10/31/1945, 66 y.o.   MRN: [id number]  2 weeks ago he fell and caught himself on the floor with right hand. The following day he noted a bruise over right biceps area and now has noted a "popeye" biceps. He has noted some loss of strength in right arm.  Past Medical History  Diagnosis Date  . Allergic rhinitis   . Elevated lipids   . GERD (gastroesophageal reflux disease)   . Hyperlipidemia   . Chills   . Fatigue   . Wears dentures   . Cough   . Shortness of breath   . Bronchitis   . Nausea vomiting and diarrhea   . Abdominal pain   . Rectal bleeding   . Hernia   . Wears glasses   . Blood in urine   . Difficulty urinating   . Weakness   . PONV (postoperative nausea and vomiting)     in the 50's & 60's, had N&V with rubber mask  . Diabetes mellitus     diet controlled - since 2012  . Headache     headaches-related to sinuses & uses Tylenol   . Cancer     skin- basal cell  . Osteoarthritis     ankle- left, hands & neck & hip  . Neuromuscular disorder     burning in both hands & fingers  . Asthma     History   Social History  . Marital Status: Married    Spouse Name: N/A    Number of Children: N/A  . Years of Education: N/A   Occupational History  . Not on file.   Social History Main Topics  . Smoking status: Current Some Day Smoker -- 0.5 packs/day    Types: Cigarettes  . Smokeless tobacco: Never Used  . Alcohol Use: No  . Drug Use: No  . Sexually Active: Not on file   Other Topics Concern  . Not on file   Social History Narrative   MarriedDaily caffeine use- 6Patient does not get regular exercise    Past Surgical History  Procedure Date  . Ankle surgery   . Esophagogastroduodenoscopy 06/27/2006  . Tonsillectomy   . Varicocelectomy   . Left ankle mass removed 1972    3 surgeries on L ankle- 73, 2008, 2009  . Neck surgery 03/11  . Back surgery     cerv. fusion C1- T1, 2 separate surgeries   .  Appendectomy     1964  . Cystoscopy     multiple times for infections  . Inguinal hernia repair 03/27/2011    Procedure: HERNIA REPAIR INGUINAL ADULT;  Surgeon: Shelly Rubenstein, MD;  Location: [facility] OR;  Service: General;  Laterality: Left;    Family History  Problem Relation Age of Onset  . Stroke Mother   . Anesthesia problems Mother   . Cerebral aneurysm Father   . Diabetes Sister   . Breast cancer Sister   . Breast cancer Sister     Allergies  Allergen Reactions  . Aspirin Anaphylaxis  . Codeine Phosphate Anaphylaxis  . Ibuprofen Anaphylaxis  . Morphine Sulfate Anaphylaxis  . Naproxen Sodium Anaphylaxis  . Nsaids Anaphylaxis  . Sulfamethoxazole Anaphylaxis  . Levofloxacin Itching    Current Outpatient Prescriptions on File Prior to Visit  Medication Sig Dispense Refill  . acetaminophen (TYLENOL) 500 MG tablet Take 500-1,000 mg by mouth every 6 (six) hours as needed. For pain      .  albuterol (PROAIR HFA) 108 (90 BASE) MCG/ACT inhaler Inhale 2 puffs into the lungs every 4 (four) hours as needed. For shortness of breath      . fish oil-omega-3 fatty acids 1000 MG capsule Take 1 g by mouth 2 (two) times daily.       . fluticasone (FLONASE) 50 MCG/ACT nasal spray Place 2 sprays into the nose daily.      . lansoprazole (PREVACID) 30 MG capsule Take 30 mg by mouth every morning.       . lidocaine (LIDODERM) 5 % Place 0.5 patches onto the skin daily. Remove & Discard patch within 12 hours or as directed by MD      . polycarbophil (FIBERCON) 625 MG tablet Take 625 mg by mouth daily.      . pseudoephedrine (SUDAFED) 30 MG tablet Take 30 mg by mouth every 4 (four) hours as needed. For congestion      . simvastatin (ZOCOR) 20 MG tablet Take 0.5 tablets (10 mg total) by mouth at bedtime.  90 tablet  3  . sodium chloride (OCEAN) 0.65 % nasal spray Place 1 spray into the nose daily as needed. For nasal congestion      . solifenacin (VESICARE) 5 MG tablet Take 5 mg by mouth every  morning.       . Tamsulosin HCl (FLOMAX) 0.4 MG CAPS Take 0.4 mg by mouth daily.         patient denies chest pain, shortness of breath, orthopnea. Denies lower extremity edema, abdominal pain, change in appetite, change in bowel movements. Patient denies rashes, musculoskeletal complaints. No other specific complaints in a complete review of systems.   BP 102/64  Temp 97.9 F (36.6 C) (Oral)  Wt 133 lb (60.328 kg)  well-developed well-nourished male in no acute distress. HEENT exam atraumatic, normocephalic, neck supple without jugular venous distention. Exaggerated biceps on right and left.  A/P-- bilateral biceps tendon tear- the right is more acute by history-- will ask ortho to consult. Should it be repaired?

## 2011-06-28 ENCOUNTER — Telehealth: Payer: Self-pay | Admitting: Internal Medicine

## 2011-06-28 NOTE — Telephone Encounter (Signed)
ER CALL. Caller: Christian Roberts/Patient; PCP: Birdie Sons; CB#: [phone]; ; ; Call regarding Question Number of times Pt should Check FSBS and what times.  Pt received new DM Accu-Chek Meter on 6-21.  PLEASE CALL PT BACK W/ FSBS INFO.

## 2011-06-28 NOTE — Telephone Encounter (Signed)
Please advise, don't see anything in the chart

## 2011-07-01 MED ORDER — TRIAMCINOLONE ACETONIDE 0.5 % EX OINT: TOPICAL_OINTMENT | Freq: Two times a day (BID) | CUTANEOUS | Status: AC

## 2011-07-01 NOTE — Telephone Encounter (Signed)
See triamcinolone order

## 2011-07-01 NOTE — Telephone Encounter (Signed)
Pt aware that rx was sent in electronically

## 2011-07-15 ENCOUNTER — Ambulatory Visit (INDEPENDENT_AMBULATORY_CARE_PROVIDER_SITE_OTHER): Payer: BC Managed Care – PPO | Admitting: Internal Medicine

## 2011-07-15 ENCOUNTER — Encounter: Payer: Self-pay | Admitting: Internal Medicine

## 2011-07-15 VITALS — BP 110/76 | Temp 98.0°F | Wt 127.0 lb

## 2011-07-15 DIAGNOSIS — N138 Other obstructive and reflux uropathy: Secondary | ICD-10-CM

## 2011-07-15 DIAGNOSIS — N401 Enlarged prostate with lower urinary tract symptoms: Secondary | ICD-10-CM

## 2011-07-15 DIAGNOSIS — R300 Dysuria: Secondary | ICD-10-CM

## 2011-07-15 DIAGNOSIS — N390 Urinary tract infection, site not specified: Secondary | ICD-10-CM

## 2011-07-15 LAB — POCT URINALYSIS DIPSTICK
Bilirubin, UA: NEGATIVE
Nitrite, UA: NEGATIVE
Spec Grav, UA: 1.01
pH, UA: 6

## 2011-07-15 MED ORDER — AMOXICILLIN 500 MG PO CAPS: 500.0000 mg | ORAL_CAPSULE | Freq: Three times a day (TID) | ORAL | Status: AC

## 2011-07-15 NOTE — Progress Notes (Signed)
  Subjective:    Patient ID: Christian Roberts, male    DOB: [birth year], 66 y.o.   MRN: [id number]  HPI  66 year old patient who has a history of BPH. He is followed by urology. Earlier today noted some dysuria associated with the dark brown urine. He has had some urinary frequency. No fever or history of kidney stones. He states that his urine color has normalized. Her urinalysis did reveal pyuria and hematuria. He states he has had UTIs in the past. His allergies were reviewed..   Review of Systems  Constitutional: Negative for fever, chills and diaphoresis.  Genitourinary: Positive for dysuria, frequency, hematuria and difficulty urinating. Negative for urgency, flank pain, discharge and penile pain.       Objective:   Physical Exam  Constitutional: He appears well-developed and well-nourished. No distress.          Assessment & Plan:   UTI/BPH.  Will treat with Amoxil and f/u U/A in 3-4 weeks.  Refer back to urology if hematuria persists

## 2011-07-15 NOTE — Patient Instructions (Addendum)
Follow up urine check in 4 weeks  Call or return to clinic prn if these symptoms worsen or fail to improve as anticipated.

## 2011-08-02 ENCOUNTER — Telehealth: Payer: Self-pay | Admitting: Internal Medicine

## 2011-08-02 ENCOUNTER — Ambulatory Visit (INDEPENDENT_AMBULATORY_CARE_PROVIDER_SITE_OTHER): Payer: BC Managed Care – PPO | Admitting: Family Medicine

## 2011-08-02 ENCOUNTER — Encounter: Payer: Self-pay | Admitting: Family Medicine

## 2011-08-02 VITALS — BP 118/70 | HR 101 | Temp 98.3°F | Wt 126.0 lb

## 2011-08-02 DIAGNOSIS — E119 Type 2 diabetes mellitus without complications: Secondary | ICD-10-CM

## 2011-08-02 DIAGNOSIS — N390 Urinary tract infection, site not specified: Secondary | ICD-10-CM

## 2011-08-02 DIAGNOSIS — N3289 Other specified disorders of bladder: Secondary | ICD-10-CM

## 2011-08-02 DIAGNOSIS — R3989 Other symptoms and signs involving the genitourinary system: Secondary | ICD-10-CM

## 2011-08-02 LAB — POCT URINALYSIS DIPSTICK
Glucose, UA: NEGATIVE
Ketones, UA: NEGATIVE
Spec Grav, UA: <=1.005
Urobilinogen, UA: 0.2

## 2011-08-02 LAB — HEMOGLOBIN A1C: Hgb A1c MFr Bld: 6.7 % — ABNORMAL HIGH (ref 4.6–6.5)

## 2011-08-02 MED ORDER — DOXYCYCLINE HYCLATE 100 MG PO CAPS: 100.0000 mg | ORAL_CAPSULE | Freq: Two times a day (BID) | ORAL | Status: DC

## 2011-08-02 NOTE — Progress Notes (Signed)
  Subjective:    Patient ID: Christian Roberts, male    DOB: [birth year], 66 y.o.   MRN: [id number]  HPI Here for a partially treated UTI. He has hx of frequent UTIs. He was seen here on 07-15-11 for this and was given a course of Amoxicillin. No culture was done. He felt better for awhile but now the burning and frequency are back. He has some mild back aches as well. No nausea or fever. He drinks a lot of water. He is concerned about his diabetes. His last A1c in April was 6.6, but his glucoses have been going up since then. He gets from 90 to 150 at home. He watches his diet closely but takes no meds for this.    Review of Systems  Constitutional: Negative.   Gastrointestinal: Negative.   Genitourinary: Positive for dysuria, urgency, frequency and flank pain. Negative for hematuria, difficulty urinating and testicular pain.       Objective:   Physical Exam  Constitutional: He appears well-developed and well-nourished.  Abdominal: Soft. Bowel sounds are normal. He exhibits no distension and no mass. There is no tenderness. There is no rebound and no guarding.          Assessment & Plan:  Culture the urine today. Start Doxycycline for 10 days. Get an A1c today. He is not scheduled to see Dr. Cato Mulligan until November, but he may need a sooner visit.

## 2011-08-02 NOTE — Telephone Encounter (Signed)
appt noted.

## 2011-08-02 NOTE — Telephone Encounter (Signed)
Caller: Jamarian/Patient; PCP: Birdie Sons; CB#: [phone]; ; ; Call regarding Pressure Urinating;  Onset- 07/13/11 Afebrile. Pt has finished antibiotics for UTI and continues to have pressure, odor, and difficulty with urination. Emergent s/s of Urinary s/s protocol states see provider within 24hrs. Appt scheduled with Dr. Clent Ridges today at 1:45pm.

## 2011-08-06 ENCOUNTER — Telehealth: Payer: Self-pay | Admitting: Internal Medicine

## 2011-08-06 DIAGNOSIS — R634 Abnormal weight loss: Secondary | ICD-10-CM

## 2011-08-06 LAB — URINE CULTURE: Colony Count: >=100000

## 2011-08-06 MED ORDER — NITROFURANTOIN MONOHYD MACRO 100 MG PO CAPS: 100.0000 mg | ORAL_CAPSULE | Freq: Two times a day (BID) | ORAL | Status: AC

## 2011-08-06 NOTE — Telephone Encounter (Signed)
Caller: Veto/Patient; PCP: Birdie Sons; CB#: [phone];  Call regarding Seen in Office On 08/02/11 and A1C and Urine Cultures Done. Calling to find out Results and If He Needs Any Meds; Started on Doxycylline on 08/02/11 and sx not improved. He is having flank pain and urgency.  He is having periods of feeling heavy headed and and tired during the day. He is quickly loosing weight dispite drinking and eating constantly. PLEASE CALL HIM BACK BEFORE OFFICE CLOSES TODAY 08/06/11. Triage per [facility] Diabetes Protocols -

## 2011-08-06 NOTE — Telephone Encounter (Signed)
Dr Clent Ridges gave pt Christian Roberts but he wanted to ask Dr Cato Mulligan about his rapid weight loss.  Pt saw Dr Kirtland Bouchard on 07/15/11 and weighed 127 and saw Dr Clent Ridges on 7/26 and weighed 126.  When I told pt that he said that his clothes are falling off of him.  Wants to know if he should see an endocrinologist.  His hgba1c is 6.7.  Please advise

## 2011-08-07 ENCOUNTER — Other Ambulatory Visit (INDEPENDENT_AMBULATORY_CARE_PROVIDER_SITE_OTHER): Payer: BC Managed Care – PPO

## 2011-08-07 ENCOUNTER — Other Ambulatory Visit: Payer: BC Managed Care – PPO

## 2011-08-07 ENCOUNTER — Ambulatory Visit (INDEPENDENT_AMBULATORY_CARE_PROVIDER_SITE_OTHER)
Admission: RE | Admit: 2011-08-07 | Discharge: 2011-08-07 | Disposition: A | Discharge status: Home / Self Care | Payer: BC Managed Care – PPO | Source: Ambulatory Visit | Attending: Internal Medicine | Admitting: Internal Medicine

## 2011-08-07 DIAGNOSIS — R634 Abnormal weight loss: Secondary | ICD-10-CM

## 2011-08-07 LAB — CBC WITH DIFFERENTIAL/PLATELET
Basophils Absolute: 0.1 10*3/uL (ref 0.0–0.1)
Eosinophils Absolute: 0.2 10*3/uL (ref 0.0–0.7)
Hemoglobin: 13.1 g/dL (ref 13.0–17.0)
Lymphocytes Relative: 35 % (ref 12.0–46.0)
MCHC: 33.4 g/dL (ref 30.0–36.0)
Monocytes Relative: 8.3 % (ref 3.0–12.0)
Neutro Abs: 3.9 10*3/uL (ref 1.4–7.7)
Neutrophils Relative %: 53 % (ref 43.0–77.0)
Platelets: 310 10*3/uL (ref 150.0–400.0)
RDW: 14.4 % (ref 11.5–14.6)

## 2011-08-07 LAB — HEPATIC FUNCTION PANEL
AST: 19 U/L (ref 0–37)
Albumin: 4.1 g/dL (ref 3.5–5.2)
Alkaline Phosphatase: 56 U/L (ref 39–117)
Bilirubin, Direct: 0.1 mg/dL (ref 0.0–0.3)
Total Bilirubin: 0.7 mg/dL (ref 0.3–1.2)

## 2011-08-07 LAB — TSH: TSH: 0.98 u[IU]/mL (ref 0.35–5.50)

## 2011-08-07 LAB — BASIC METABOLIC PANEL
BUN: 16 mg/dL (ref 6–23)
CO2: 30 mEq/L (ref 19–32)
Calcium: 9.7 mg/dL (ref 8.4–10.5)
Chloride: 103 mEq/L (ref 96–112)
Creatinine, Ser: 1 mg/dL (ref 0.4–1.5)
Glucose, Bld: 108 mg/dL — ABNORMAL HIGH (ref 70–99)

## 2011-08-07 NOTE — Telephone Encounter (Signed)
Future orders placed, appt scheduled, pt was given instructions to [facility] for CXR

## 2011-08-07 NOTE — Telephone Encounter (Signed)
i'd like to do a few things to evaluate for weight loss:  Labs: cbc, bmet, lfts, esr, tsh Check cxr

## 2011-08-13 ENCOUNTER — Telehealth: Payer: Self-pay | Admitting: Internal Medicine

## 2011-08-13 NOTE — Telephone Encounter (Signed)
Caller: Christian Roberts/Patient; PCP: Birdie Sons; CB#: [phone]; ; ; Call regarding Follow Up Unintentional Weight Loss; states received a call from the office 08/12/11 that his bloodwork and his xrays are clear, but states he cannot seem to stop losing weight and is concerned that he is losing too much weight.  Also states he needs to schedule visit to receive pertussis vaccine, as he is expecting a new grandbaby.  Seen by nutritionist October 2012 for weight loss program due to being "borderline diabetic," and was told to lose 5 pounds, but has lost more than 25 pounds and cannot seem to stop the loss despite increasing his calories.  His waist has gone from "a very tight" 34 to a loose 29.  Using sugar-free boost.  Blood sugars run between 114-147, with some peaks 289.   A1C 08/07/11 6.7 compared to a year ago 7.3.  Per protocol, emergent symptoms denied; advised appt within 72 hours.  Appointment sched x 30 minutes 08/14/11 0930 with Dr. Caryl Never.

## 2011-08-14 ENCOUNTER — Ambulatory Visit: Payer: BC Managed Care – PPO | Admitting: Family Medicine

## 2011-08-21 ENCOUNTER — Ambulatory Visit: Payer: BC Managed Care – PPO | Admitting: Internal Medicine

## 2011-08-21 VITALS — Wt 126.0 lb

## 2011-08-21 DIAGNOSIS — R634 Abnormal weight loss: Secondary | ICD-10-CM

## 2011-08-21 NOTE — Progress Notes (Signed)
Pt came in for TDAP however, he had one in 2007.  Checked pts weight

## 2011-08-24 ENCOUNTER — Encounter: Payer: Self-pay | Admitting: Family Medicine

## 2011-08-24 ENCOUNTER — Ambulatory Visit (INDEPENDENT_AMBULATORY_CARE_PROVIDER_SITE_OTHER): Payer: BC Managed Care – PPO | Admitting: Family Medicine

## 2011-08-24 VITALS — BP 110/70 | Temp 97.7°F | Wt 127.0 lb

## 2011-08-24 DIAGNOSIS — S61209A Unspecified open wound of unspecified finger without damage to nail, initial encounter: Secondary | ICD-10-CM

## 2011-08-24 DIAGNOSIS — S61219A Laceration without foreign body of unspecified finger without damage to nail, initial encounter: Secondary | ICD-10-CM

## 2011-08-24 MED ORDER — CEPHALEXIN 500 MG PO CAPS: 500.0000 mg | ORAL_CAPSULE | Freq: Three times a day (TID) | ORAL | Status: AC

## 2011-08-24 NOTE — Patient Instructions (Signed)
Can change and remove dressing in 48 hours  Then try to keep dry and change dressing After 5-7 days can apply vaseline and keep covered with a bandaid  Can fill the antibiotic rx if needed if starts to look infected.

## 2011-08-24 NOTE — Progress Notes (Signed)
  Subjective:    Patient ID: Christian Roberts, male    DOB: 08-21-1945, 66 y.o.   MRN: [id number]  HPI Was using [name] and cut the tip of his finge.  Happened yesterday.  Cleaned and put an antibiotic cream and bandaged it.  Last Td was in 2009.  He is leaving for [facility] Monday Am.  No fever or red streaking. Can use his finger without difficultly.    Review of Systems     Objective:   Physical Exam  Left index finger with 1.5 cm laceration, L shaped,  with the edge of the flap missing.  No tendon, or bone exposed. No active bleeding, weeping or drianage.  NROM of the finger itself. No sign of infection or cellulitis.        Assessment & Plan:  Finger laceration. Wound cleaned and soaked.  Dried and steri-stip applied. Dermabond not available.  Gauze placed and wrapped with coban.  Given keflex in case started to look infected since leaving town in 48 hours.  Cal if any concerns it is not healing well. Keep dry and leave steri=stip in place for 5 days if possible.  Given rx for keflex in case starts to look infected since he is leaving town.

## 2011-09-04 ENCOUNTER — Ambulatory Visit: Payer: BC Managed Care – PPO | Admitting: Internal Medicine

## 2011-09-25 ENCOUNTER — Ambulatory Visit: Payer: BC Managed Care – PPO | Admitting: *Deleted

## 2011-09-25 ENCOUNTER — Encounter: Payer: Self-pay | Admitting: *Deleted

## 2011-09-25 VITALS — Wt 128.0 lb

## 2011-09-25 DIAGNOSIS — R634 Abnormal weight loss: Secondary | ICD-10-CM

## 2011-11-20 ENCOUNTER — Ambulatory Visit: Payer: BC Managed Care – PPO | Admitting: Internal Medicine

## 2011-12-17 ENCOUNTER — Telehealth: Payer: Self-pay | Admitting: Internal Medicine

## 2011-12-17 NOTE — Telephone Encounter (Signed)
Called pt back - now he wants to leave it. Appt still for 12/16. Thnks.

## 2011-12-17 NOTE — Telephone Encounter (Signed)
Patient has 76mo f/u on Mon 12/16. Was supped to be early Nov, but Dr. Cato Mulligan had to bump him. Now he's had an unexpected family thing come up and cannot be here Monday. He is requesting to be worked in today or Friday - says it cannot wait & he needs to see Dr. Cato Mulligan. Please ask him and let me know. Thanks.

## 2011-12-17 NOTE — Telephone Encounter (Signed)
Per Dr Cato Mulligan pt can come in Friday at 10am

## 2011-12-23 ENCOUNTER — Ambulatory Visit (INDEPENDENT_AMBULATORY_CARE_PROVIDER_SITE_OTHER): Payer: BC Managed Care – PPO | Admitting: Internal Medicine

## 2011-12-23 ENCOUNTER — Encounter: Payer: Self-pay | Admitting: Internal Medicine

## 2011-12-23 VITALS — BP 122/72 | HR 96 | Temp 97.9°F | Wt 133.0 lb

## 2011-12-23 DIAGNOSIS — Z23 Encounter for immunization: Secondary | ICD-10-CM

## 2011-12-23 DIAGNOSIS — E119 Type 2 diabetes mellitus without complications: Secondary | ICD-10-CM

## 2011-12-23 DIAGNOSIS — G629 Polyneuropathy, unspecified: Secondary | ICD-10-CM

## 2011-12-23 DIAGNOSIS — G589 Mononeuropathy, unspecified: Secondary | ICD-10-CM

## 2011-12-23 LAB — BASIC METABOLIC PANEL
BUN: 15 mg/dL (ref 6–23)
Chloride: 100 mEq/L (ref 96–112)
Creatinine, Ser: 1 mg/dL (ref 0.4–1.5)
GFR: 82.52 mL/min (ref >60.00)
Potassium: 4.5 mEq/L (ref 3.5–5.1)

## 2011-12-23 LAB — LIPID PANEL
Cholesterol: 215 mg/dL — ABNORMAL HIGH (ref 0–200)
Total CHOL/HDL Ratio: 4
Triglycerides: 128 mg/dL (ref 0.0–149.0)

## 2011-12-23 LAB — HEPATIC FUNCTION PANEL
AST: 20 U/L (ref 0–37)
Albumin: 4.5 g/dL (ref 3.5–5.2)
Alkaline Phosphatase: 54 U/L (ref 39–117)
Total Protein: 7.4 g/dL (ref 6.0–8.3)

## 2011-12-24 LAB — HEMOGLOBIN A1C: Hgb A1c MFr Bld: 6.8 % — ABNORMAL HIGH (ref 4.6–6.5)

## 2011-12-24 NOTE — Progress Notes (Signed)
  Subjective:    Patient ID: Christian Roberts, male    DOB: 1945/11/24, 66 y.o.   MRN: [id number]  HPI    Review of Systems     Objective:   Physical Exam        Assessment & Plan:  Scanned progress note due to unexpected downtime

## 2011-12-25 ENCOUNTER — Other Ambulatory Visit: Payer: Self-pay | Admitting: *Deleted

## 2011-12-25 ENCOUNTER — Telehealth: Payer: Self-pay | Admitting: *Deleted

## 2011-12-25 DIAGNOSIS — E785 Hyperlipidemia, unspecified: Secondary | ICD-10-CM

## 2011-12-25 MED ORDER — ATORVASTATIN CALCIUM 20 MG PO TABS: 20.0000 mg | ORAL_TABLET | Freq: Every day | ORAL | Status: DC

## 2011-12-25 NOTE — Telephone Encounter (Signed)
Pt wants to know what he should about his neuropathy.  He stated he would be told when lab results were called

## 2011-12-27 MED ORDER — GABAPENTIN 300 MG PO CAPS: 300.0000 mg | ORAL_CAPSULE | Freq: Every evening | ORAL | Status: DC | PRN

## 2011-12-27 NOTE — Telephone Encounter (Signed)
Pt aware, rx sent in electronically 

## 2011-12-27 NOTE — Telephone Encounter (Signed)
He can try a medication if the sxs are bad enough neurontin 300 mg po qhs #300/3 refills

## 2012-01-06 ENCOUNTER — Telehealth: Payer: Self-pay | Admitting: Internal Medicine

## 2012-01-06 NOTE — Telephone Encounter (Signed)
RN attempted to contact patient, left a voice mail.   ° °

## 2012-01-07 ENCOUNTER — Telehealth: Payer: Self-pay | Admitting: Internal Medicine

## 2012-01-07 DIAGNOSIS — G629 Polyneuropathy, unspecified: Secondary | ICD-10-CM

## 2012-01-07 NOTE — Telephone Encounter (Signed)
states unable to test for blood sugar, as he states he is not getting enough blood out of the tip of his finger.  States he has switched to the "meaner" lancet position, but still cannot get enough blood out to test sugar.  Advised to try to warm hand first before testing to see if improves.   Also states he has not heard from office regarding referral regarding foot, on which he had surgery 3 years ago.  States his insurance will only [employer], and it came with specific lancets, so is not sure if they will cover a different lancet. Info to office for provider review/other recommendations/callback.  krs/can

## 2012-01-07 NOTE — Telephone Encounter (Signed)
Patient states that the lancets are now working better with warm compresses.  Patient states that the Gabapentin is not helping with his finger tips and there is no improvement with his foot.  He would like a referral if possible.

## 2012-01-07 NOTE — Telephone Encounter (Signed)
Can refer to neurology-- peripheral neuropathy

## 2012-01-09 NOTE — Telephone Encounter (Signed)
Referral order placed.

## 2012-02-17 ENCOUNTER — Telehealth: Payer: Self-pay | Admitting: Internal Medicine

## 2012-02-17 NOTE — Telephone Encounter (Signed)
Patient Information:  Caller Name: Christian Roberts  Phone: [phone]  Patient: Christian Roberts, Christian Roberts  Gender: Male  DOB: 07/24/1945  Age: 67 Years  PCP: Christian Roberts (Adults only)  Office Follow Up:  Does the office need to follow up with this patient?: Yes  Instructions For The Office: Patient requests  Rx for his hemorroids.  States he cannot get flight home from [place], [place] at this time and therefore cannot come to office today.  Pharmacy information listed in notes.  Thank you.  RN Note:  IF MD is willing to call in Rx.,  [facility] Phone [phone] [facility] [place], [place], [place]  [address])  Symptoms  Reason For Call & Symptoms: Reports 8-10  soft stools per day which is causing hemorrhoids bleeding and itching.  Requests Rx to be called in for him. He is currenly in [place], [place] and unable to get flight home.   Requests cortisone cream that is inserted in rectum ( possibly previously Rx by Dr. Arlyce Dice).  Describes quarter sized spots of blood on tissue multiple times with each stool.  Reviewed Health History In EMR: Yes  Reviewed Medications In EMR: Yes  Reviewed Allergies In EMR: Yes  Reviewed Surgeries / Procedures: Yes  Date of Onset of Symptoms: 02/12/2012  Treatments Tried: OTC hemorrhoid creams  Treatments Tried Worked: No  Guideline(s) Used:  Rectal Bleeding  Disposition Per Guideline:   See Today in Office  Reason For Disposition Reached:   All other patients with rectal bleeding (Exceptions: blood just on toilet paper, few drops, streaks on surface of normal formed BM)  Advice Given:  Warm SITZ Baths Twice a Day:   Sit in a warm saline bath for 20 minutes 2 times daily to cleanse the rectal area and to promote healing.  You can add 2 ounces (57 grams) of table salt or baking soda to each tub of water.  Call Back If:  Bleeding increases in amount  You become worse.  Patient Refused Recommendation:  Patient Refused Care Advice  Currently in  [place], [place] and cannot get flight home.

## 2012-02-17 NOTE — Telephone Encounter (Signed)
Dr Cato Mulligan is not in the office this week, advised pt to go to Urgent Care in [place], pt verbalized understanding and had no questions

## 2012-02-17 NOTE — Telephone Encounter (Signed)
Patient following up on earlier call about a request for hemorroid medication called to him in [place] at the number previously listed.  Please Call.

## 2012-04-06 ENCOUNTER — Telehealth: Payer: Self-pay | Admitting: Internal Medicine

## 2012-04-06 NOTE — Telephone Encounter (Signed)
Send him to [facility] smoking cessation classes. AFter first class we may consider chantix

## 2012-04-06 NOTE — Telephone Encounter (Signed)
[address] [place], [place] [address] p. [phone] f. [phone] To: [facility] (After Hours Triage) Fax: [phone] From: [facility] Date/ Time: 04/03/2012 5:36 PM Taken By: Jethro BolusGrayling Congress Facility: home Patient: Christian, Roberts DOB: 11-17-1945 Phone: [phone] Reason for Call: He is wanting to get into a smoking cessation program. Is there one you recommend? He also would like a prescription for Chantix. [facility]

## 2012-04-08 NOTE — Telephone Encounter (Signed)
Gave pt [facility] website, also pt aware of chantix advice

## 2012-04-24 ENCOUNTER — Ambulatory Visit (INDEPENDENT_AMBULATORY_CARE_PROVIDER_SITE_OTHER): Payer: BC Managed Care – PPO | Admitting: Internal Medicine

## 2012-04-24 ENCOUNTER — Encounter: Payer: Self-pay | Admitting: Internal Medicine

## 2012-04-24 VITALS — BP 126/72 | HR 87 | Temp 97.9°F | Resp 16 | Wt 137.0 lb

## 2012-04-24 DIAGNOSIS — M25473 Effusion, unspecified ankle: Secondary | ICD-10-CM

## 2012-04-24 DIAGNOSIS — M19079 Primary osteoarthritis, unspecified ankle and foot: Secondary | ICD-10-CM

## 2012-04-24 DIAGNOSIS — M8540 Solitary bone cyst, unspecified site: Secondary | ICD-10-CM

## 2012-04-24 DIAGNOSIS — M85472 Solitary bone cyst, left ankle and foot: Secondary | ICD-10-CM

## 2012-04-24 DIAGNOSIS — M25472 Effusion, left ankle: Secondary | ICD-10-CM

## 2012-04-24 DIAGNOSIS — M25476 Effusion, unspecified foot: Secondary | ICD-10-CM

## 2012-04-24 DIAGNOSIS — M1990 Unspecified osteoarthritis, unspecified site: Secondary | ICD-10-CM

## 2012-04-24 DIAGNOSIS — M19072 Primary osteoarthritis, left ankle and foot: Secondary | ICD-10-CM

## 2012-04-24 MED ORDER — PREDNISONE (PAK) 10 MG PO TABS: 10.0000 mg | ORAL_TABLET | ORAL | Status: DC

## 2012-04-24 NOTE — Assessment & Plan Note (Signed)
Several problem areas, chronic L ankle issues with multiple surgeries for same Acute swelling and chronic DJDJ Steroid injection today as above Pred taper x 6 days - erx done follow up ortho as needed

## 2012-04-24 NOTE — Patient Instructions (Signed)
Joint Injection  Care After  Refer to this sheet in the next few days. These instructions provide you with information on caring for yourself after you have had a joint injection. Your caregiver also may give you more specific instructions. Your treatment has been planned according to current medical practices, but problems sometimes occur. Call your caregiver if you have any problems or questions after your procedure.  After any type of joint injection, it is not uncommon to experience:  · Soreness, swelling, or bruising around the injection site.  · Mild numbness, tingling, or weakness around the injection site caused by the numbing medicine used before or with the injection.  It also is possible to experience the following effects associated with the specific agent after injection:  · Iodine-based contrast agents:  · Allergic reaction (itching, hives, widespread redness, and swelling beyond the injection site).  · Corticosteroids (These effects are rare.):  · Allergic reaction.  · Increased blood sugar levels (If you have diabetes and you notice that your blood sugar levels have increased, notify your caregiver).  · Increased blood pressure levels.  · Mood swings.  · Hyaluronic acid in the use of viscosupplementation.  · Temporary heat or redness.  · Temporary rash and itching.  · Increased fluid accumulation in the injected joint.  These effects all should resolve within a day after your procedure.   HOME CARE INSTRUCTIONS  · Limit yourself to light activity the day of your procedure. Avoid lifting heavy objects, bending, stooping, or twisting.  · Take prescription or over-the-counter pain medication as directed by your caregiver.  · You may apply ice to your injection site to reduce pain and swelling the day of your procedure. Ice may be applied 3 to 4 times:  · Put ice in a plastic bag.  · Place a towel between your skin and the bag.  · Leave the ice on for no longer than 15 to 20 minutes each time.  SEEK  IMMEDIATE MEDICAL CARE IF:   · Pain and swelling get worse rather than better or extend beyond the injection site.  · Numbness does not go away.  · Blood or fluid continues to leak from the injection site.  · You have chest pain.  · You have swelling of your face or tongue.  · You have trouble breathing or you become dizzy.  · You develop a fever, chills, or severe tenderness at the injection site that last longer than 1 day.  MAKE SURE YOU:  · Understand these instructions.  · Watch your condition.  · Get help right away if you are not doing well or if you get worse.  Document Released: 09/06/2010 Document Revised: 03/18/2011 Document Reviewed: 09/06/2010  Exit[employer]® Patient Information ©2013 [employer].

## 2012-04-25 NOTE — Progress Notes (Signed)
  Subjective:    Patient ID: Christian Roberts, male    DOB: 06-Jul-1945, 67 y.o.   MRN: [id number]  HPI  complains of acute on chronic left ankle pain - Requests steroid injection and steroid pak  Past Medical History  Diagnosis Date  . Allergic rhinitis   . Elevated lipids   . GERD (gastroesophageal reflux disease)   . Hyperlipidemia   . Left ankle pain     chronic  . Bronchitis   . Hernia   . Diabetes mellitus     diet controlled - since 2012  . Headache     headaches-related to sinuses & uses Tylenol   . Skin cancer, basal cell   . Osteoarthritis     ankle- left, hands & neck & hip  . Asthma   . Neck pain     Review of Systems  Constitutional: Negative for fever and unexpected weight change.  Respiratory: Negative for cough and shortness of breath.   Musculoskeletal: Positive for joint swelling and gait problem. Negative for back pain.       Objective:   Physical Exam BP 126/72  Pulse 87  Temp(Src) 97.9 F (36.6 C) (Oral)  Resp 16  Wt 137 lb (62.143 kg)  BMI 23.5 kg/m2  SpO2 98% Constitutional:  He appears well-developed and well-nourished. No distress.  Musculoskeletal: L ankle with lateral>medial boggy synovitis, tender diffusely anterolaterally. Pain with active and passive ROM, and WB. Moderate antalgic gait - neurovasc intact - no open wounds, no cellulitis or abnormal warmth. Skin: Skin is warm and dry.  No erythema or ulceration.  Psychiatric: he has a normal mood and affect. behavior is normal. Judgment and thought content normal.   Lab Results  Component Value Date   WBC 7.4 08/07/2011   HGB 13.1 08/07/2011   HCT 39.2 08/07/2011   PLT 310.0 08/07/2011   GLUCOSE 94 12/23/2011   CHOL 215* 12/23/2011   TRIG 128.0 12/23/2011   HDL 57.80 12/23/2011   LDLDIRECT 145.3 12/23/2011   LDLCALC 98 05/07/2011   ALT 22 12/23/2011   AST 20 12/23/2011   NA 140 12/23/2011   K 4.5 12/23/2011   CL 100 12/23/2011   CREATININE 1.0 12/23/2011   BUN 15 12/23/2011   CO2 30 12/23/2011   TSH 1.52 12/23/2011   PSA 6.68* 11/30/2007   HGBA1C 6.8* 12/23/2011    Procedure Note: ankle intra-articular injection the patient elects to proceed after verbal consent is obtained. the patient informed of possible risks and complications prior to procedure. Using sterile technique throughout, patient is injected with 1:1 40 mg depomedrol: 1% lidocaine anterolateral into L ankle. the patient tolerated the procedure well, no immediate complications. Ice 24-48h, heat thereafter as needed instructions aftercare provided.       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

## 2012-04-27 ENCOUNTER — Telehealth: Payer: Self-pay | Admitting: Internal Medicine

## 2012-04-27 NOTE — Telephone Encounter (Signed)
[facility] Triage Call Report Triage Record Num: [id number] Operator: Hyman Bower Patient Name: Christian Roberts Call Date & Time: 04/24/2012 9:53:30AM Patient Phone: [phone] PCP: Valetta Mole. Swords Patient Gender: Male PCP Fax : [phone] Patient DOB: 02-14-1945 [facility] Name: [facility] Reason for Call: Caller: Cato/Patient; PCP: Birdie Sons (Adults only); CB#: [phone]; Reports left ankle is tender to touch and painful when ambulating. Ankle is also swollen. Reports ankle is double the size as normal. Triaged per Ankle Non-Injury guideline, disposition: see provider within 4 hours for "New marked swelling." Appointment scheduled 04/24/12 at 1:15pm with Dr. Felicity Coyer in [place] office. Address of [facility] office given to patient. Protocol(s) Used: Ankle Non-Injury Recommended Outcome per Protocol: See Provider within 4 hours Reason for Outcome: New marked swelling (twice the normal size as compared to usual appearance) Care Advice: ~ 04/18/

## 2012-05-19 ENCOUNTER — Telehealth: Payer: Self-pay | Admitting: Internal Medicine

## 2012-05-19 NOTE — Telephone Encounter (Signed)
Patient calling into triage today with concerns of "High blood sugar 341 with recent neck surgery". Attempted to reach patient twice. Reached voicemail. NO CONTACT. Left message to please call back for  Assistance. Office number provided.

## 2012-05-20 ENCOUNTER — Ambulatory Visit (INDEPENDENT_AMBULATORY_CARE_PROVIDER_SITE_OTHER): Payer: BC Managed Care – PPO | Admitting: Family Medicine

## 2012-05-20 ENCOUNTER — Encounter: Payer: Self-pay | Admitting: Family Medicine

## 2012-05-20 VITALS — BP 118/66 | HR 101 | Temp 97.6°F | Wt 138.0 lb

## 2012-05-20 DIAGNOSIS — E119 Type 2 diabetes mellitus without complications: Secondary | ICD-10-CM

## 2012-05-20 DIAGNOSIS — K649 Unspecified hemorrhoids: Secondary | ICD-10-CM

## 2012-05-20 LAB — HEMOGLOBIN A1C: Hgb A1c MFr Bld: 7.6 % — ABNORMAL HIGH (ref 4.6–6.5)

## 2012-05-20 MED ORDER — HYDROCORTISONE ACE-PRAMOXINE 2.5-1 % RE CREA: TOPICAL_CREAM | Freq: Three times a day (TID) | RECTAL | Status: DC

## 2012-05-20 NOTE — Progress Notes (Signed)
  Subjective:    Patient ID: Christian Roberts, male    DOB: [birth year], 67 y.o.   MRN: [id number]  HPI Here for several issues. He recently had cervical spine surgery with multilevel fusions at [facility], and he has been on a lot of pain medications. This has caused constipation, and this has caused his hemorrhoids to flare up. He has bleeding every time he wipes now. Also his glucoses have been running in the 200s and 300s consistently at home. He eats a healthy diet, from the sound of it. His A1c last December was 6.8.    Review of Systems  Constitutional: Negative.   Respiratory: Negative.   Cardiovascular: Negative.   Gastrointestinal: Positive for constipation and anal bleeding. Negative for abdominal pain and abdominal distention.       Objective:   Physical Exam  Constitutional: He appears well-developed and well-nourished.  Cardiovascular: Normal rate, regular rhythm, normal heart sounds and intact distal pulses.   Pulmonary/Chest: Effort normal and breath sounds normal.  Genitourinary:  Several inflamed external hemorrhoids          Assessment & Plan:  Try Miralax daily to soften the stools. Use Analpram HC cream on the hemorrhoids. Our glucometer in the office read 158 after he had eaten breakfast, so I suspect his glucometer is not working properly. Gave him a rx to get a new one. Check an A1c today

## 2012-05-22 MED ORDER — METFORMIN HCL 500 MG PO TABS: 500.0000 mg | ORAL_TABLET | Freq: Two times a day (BID) | ORAL | Status: DC

## 2012-05-22 NOTE — Progress Notes (Signed)
Quick Note:  I spoke with pt's wife and sent script e-scribe. ______

## 2012-05-22 NOTE — Progress Notes (Signed)
Quick Note:  I left voice message for pt to return my call. ______ 

## 2012-05-22 NOTE — Addendum Note (Signed)
Addended by: Aniceto Boss A on: 05/22/2012 02:01 PM   Modules accepted: Orders

## 2012-05-28 ENCOUNTER — Telehealth: Payer: Self-pay | Admitting: Gastroenterology

## 2012-05-28 ENCOUNTER — Encounter: Payer: Self-pay | Admitting: Nurse Practitioner

## 2012-05-28 ENCOUNTER — Ambulatory Visit (INDEPENDENT_AMBULATORY_CARE_PROVIDER_SITE_OTHER): Payer: BC Managed Care – PPO | Admitting: Nurse Practitioner

## 2012-05-28 VITALS — BP 108/60 | HR 88 | Ht 64.0 in | Wt 134.2 lb

## 2012-05-28 DIAGNOSIS — K649 Unspecified hemorrhoids: Secondary | ICD-10-CM

## 2012-05-28 DIAGNOSIS — K602 Anal fissure, unspecified: Secondary | ICD-10-CM

## 2012-05-28 MED ORDER — AMBULATORY NON FORMULARY MEDICATION: Status: DC

## 2012-05-28 NOTE — Patient Instructions (Addendum)
Resume your Fiber daily. Hold Miralax for now. We have sent the following medications to your pharmacy for you to pick up at your convenience: Nitroglycerine ointment to [facility] Use a pea size on a Qtip and put inside rectum and tilt to get the back wall of the rectum, use for 2 weeks.  If you have to use the Analpram cream try to space it out between using the Nitroglycerine ointment. Return in 2 weeks to see Willette Cluster, NP.  Call back in 2 week for a recheck appointment with Willette Cluster, NP.

## 2012-05-28 NOTE — Telephone Encounter (Signed)
Left message for pt to call back.  Pt had surgery recently and was placed on a stool softener in the hospital. Pts wife states he stopped that but has continued to have multiple loose stools and now has bleeding hemorrhoids. Was given some cream but that has not helped. Requesting to be seen. Pt scheduled to see Mike Gip PA today at 3pm. Wife aware of appt date and time.

## 2012-05-28 NOTE — Progress Notes (Signed)
HPI :  Patient is a 67 year old male with multiple medical problems, on multiple medications. Known to Dr. Arlyce Dice patient has a history of hemorrhoids for which he underwent banding in 2009. His last full colonoscopy was October 2002, done for evaluation of diarrhea. Findings included diverticulosis. Random biopsies were negative.  Patient referred by PCP for rectal bleeding. Bleeding started 1-2 months ago and is associated with anal discomfort. Recent neck surgery, he takes pain medications but denies consipation. Patient was started on Miralax in the hospital, he discontinued it because of excessive BMs. Patient saw  PCP 05/20/12 for rectal bleeding. He was given MiraLax and Analpram but isn't taking the Miralax. Stool are described as low volume, mushy and numerous times a day even though he isn't taking Miralax or anything else for constipation.. Patient wipes excessively as stool stick and it is hard to clean himself. Patient was on fiber prior to neck surgery but hasn't taken any since.   Past Medical History  Diagnosis Date  . Allergic rhinitis   . Elevated lipids   . GERD (gastroesophageal reflux disease)   . Hyperlipidemia   . Left ankle pain     chronic  . Bronchitis   . Hernia   . Diabetes mellitus     diet controlled - since 2012  . Headache     headaches-related to sinuses & uses Tylenol   . Skin cancer, basal cell   . Osteoarthritis     ankle- left, hands & neck & hip  . Asthma   . Neck pain     Family History  Problem Relation Age of Onset  . Stroke Mother   . Anesthesia problems Mother   . Cerebral aneurysm Father   . Diabetes Sister   . Breast cancer Sister   . Breast cancer Sister    History  Substance Use Topics  . Smoking status: Former Smoker    Types: Cigarettes    Quit date: 04/10/2012  . Smokeless tobacco: Never Used     Comment: quitting  . Alcohol Use: No   Current Outpatient Prescriptions  Medication Sig Dispense Refill  . acetaminophen  (TYLENOL) 500 MG tablet Take 500-1,000 mg by mouth every 6 (six) hours as needed. For pain      . albuterol (PROAIR HFA) 108 (90 BASE) MCG/ACT inhaler Inhale 2 puffs into the lungs every 4 (four) hours as needed. For shortness of breath      . atorvastatin (LIPITOR) 20 MG tablet Take 1 tablet (20 mg total) by mouth daily.  90 tablet  3  . diazepam (VALIUM) 5 MG tablet Take 5 mg by mouth every 12 (twelve) hours as needed for anxiety.      . fish oil-omega-3 fatty acids 1000 MG capsule Take 1 g by mouth 2 (two) times daily.       . fluticasone (FLONASE) 50 MCG/ACT nasal spray Place 2 sprays into the nose daily.      Marland Kitchen gabapentin (NEURONTIN) 300 MG capsule Take 1 capsule (300 mg total) by mouth at bedtime and may repeat dose one time if needed.  90 capsule  3  . hydrocortisone-pramoxine (ANALPRAM-HC) 2.5-1 % rectal cream Place rectally 3 (three) times daily.  30 g  5  . lidocaine (LIDODERM) 5 % Place 0.5 patches onto the skin daily. Remove & Discard patch within 12 hours or as directed by MD      . metFORMIN (GLUCOPHAGE) 500 MG tablet Take 1 tablet (500 mg total) by mouth 2 (two)  times daily with a meal.  60 tablet  2  . oxyCODONE (OXY IR/ROXICODONE) 5 MG immediate release tablet Take 5 mg by mouth every 4 (four) hours as needed for pain.      . polycarbophil (FIBERCON) 625 MG tablet Take 625 mg by mouth daily.      . predniSONE (STERAPRED UNI-PAK) 10 MG tablet Take 1 tablet (10 mg total) by mouth as directed. As directed x 6 days  21 tablet  0  . pseudoephedrine (SUDAFED) 30 MG tablet Take 30 mg by mouth every 4 (four) hours as needed. For congestion      . ranitidine (ZANTAC) 150 MG tablet Take 150 mg by mouth daily.      . sodium chloride (OCEAN) 0.65 % nasal spray Place 1 spray into the nose daily as needed. For nasal congestion      . solifenacin (VESICARE) 5 MG tablet Take 5 mg by mouth every morning.       . Tamsulosin HCl (FLOMAX) 0.4 MG CAPS Take 0.4 mg by mouth daily.      Marland Kitchen triamcinolone  ointment (KENALOG) 0.5 % Apply topically 2 (two) times daily.  30 g  0   No current facility-administered medications for this visit.   Allergies  Allergen Reactions  . Aspirin Anaphylaxis  . Codeine Phosphate Anaphylaxis  . Ibuprofen Anaphylaxis  . Morphine Sulfate Anaphylaxis  . Naproxen Sodium Anaphylaxis  . Nsaids Anaphylaxis  . Sulfamethoxazole Anaphylaxis  . Levofloxacin Itching    Review of Systems: Positive for allergy/sinus trouble, arthritis, hematuria, fever, headaches, excessive thirst, excessive urination. All other systems reviewed and negative except where noted in HPI.   Physical Exam: BP 108/60  Pulse 88  Ht 5\' 4"  (1.626 m)  Wt 134 lb 4 oz (60.895 kg)  BMI 23.03 kg/m2 Constitutional: Peasant,well-developed, white male in no acute distress. HEENT: Normocephalic and atraumatic. Conjunctivae are normal. No scleral icterus. Neck supple.  Cardiovascular: Normal rate, regular rhythm.  Pulmonary/chest: Effort normal and breath sounds normal. No wheezing, rales or rhonchi. Abdominal: Soft, nondistended, nontender. Bowel sounds active throughout. There are no masses palpable. No hepatomegaly. Rectal: swollen external hemorrhoids. Scant bright red blood oozing from anus. No fissure seen but patient so tender that optimal rectal exam not possible.  Extremities: no edema Lymphadenopathy: No cervical adenopathy noted. Neurological: Alert and oriented to person place and time. Skin: Skin is warm and dry. No rashes noted. Psychiatric: Normal mood and affect. Behavior is normal.   ASSESSMENT AND PLAN: 1. Rectal pain and bleeding. Inflamed external hemorrhoids on exam. Extremely tender posterior midline area so also suspect fissure.   Will treat with NTG ointment .   Continue Analpram for hemorrhoids.  Hold Miralax. Resume fiber to bulk up "mushy" stools.   Return to clinic in 2 weeks for reevaluation   2. Colon cancer screening. Overdue for exam. Will discuss this  at follow up visit in 2 weeks.   3. Multiple allergies  4. Multiple medical problems as listed in PMH

## 2012-05-29 DIAGNOSIS — K649 Unspecified hemorrhoids: Secondary | ICD-10-CM | POA: Insufficient documentation

## 2012-05-29 DIAGNOSIS — K602 Anal fissure, unspecified: Secondary | ICD-10-CM | POA: Insufficient documentation

## 2012-06-02 NOTE — Progress Notes (Signed)
Reviewed and agree with management. Kayzlee Wirtanen D. Kayson Bullis, M.D., FACG  

## 2012-06-03 ENCOUNTER — Telehealth: Payer: Self-pay | Admitting: Gastroenterology

## 2012-06-03 NOTE — Telephone Encounter (Signed)
Pt was seen by Willette Cluster NP and told to follow-up in a couple of weeks. Pt scheduled to see Dr. Arlyce Dice 06/17/12@9 :30am. Pt aware of appt date and time.

## 2012-06-08 ENCOUNTER — Telehealth: Payer: Self-pay | Admitting: Gastroenterology

## 2012-06-08 NOTE — Telephone Encounter (Signed)
Called patient to schedule a follow-up appointment with Christian Roberts. When the patient was here for his office visit Paula's schedule was not out. Patient stated that he called to schedule the appointment and he was schedule with Dr. Arlyce Dice. I told him that was fine to keep that appointment.

## 2012-06-08 NOTE — Telephone Encounter (Signed)
Message copied by Bernita Buffy on Mon Jun 08, 2012  1:58 PM ------      Message from: Christian Roberts A      Created: Thu May 28, 2012  3:31 PM       Call patient to schedule 2 week recheck appointment for hemorrhoids with Willette Cluster, NP ------

## 2012-06-17 ENCOUNTER — Encounter: Payer: Self-pay | Admitting: Gastroenterology

## 2012-06-17 ENCOUNTER — Ambulatory Visit (INDEPENDENT_AMBULATORY_CARE_PROVIDER_SITE_OTHER): Payer: BC Managed Care – PPO | Admitting: Gastroenterology

## 2012-06-17 ENCOUNTER — Other Ambulatory Visit: Payer: Self-pay | Admitting: *Deleted

## 2012-06-17 VITALS — BP 124/70 | HR 60 | Ht 64.0 in | Wt 131.8 lb

## 2012-06-17 DIAGNOSIS — K649 Unspecified hemorrhoids: Secondary | ICD-10-CM

## 2012-06-17 MED ORDER — ESOMEPRAZOLE MAGNESIUM 40 MG PO CPDR: 40.0000 mg | DELAYED_RELEASE_CAPSULE | Freq: Every day | ORAL | Status: DC

## 2012-06-17 NOTE — Patient Instructions (Addendum)
It has been recommended to you be Dr Arlyce Dice that you schedule a colonoscopy Please contact the office at [phone] when you are ready to schedule

## 2012-06-17 NOTE — Progress Notes (Signed)
History of Present Illness:  The patient has returned for followup of hemorrhoids. On topical therapy symptoms have improved. He is without pain. Minimal rectal bleeding is rare. He's concerned about a bulge in his left groin. He is status post inguinal hernia repair.    Review of Systems: Pertinent positive and negative review of systems were noted in the above HPI section. All other review of systems were otherwise negative.    Current Medications, Allergies, Past Medical History, Past Surgical History, Family History and Social History were reviewed in [employer] electronic medical record  Vital signs were reviewed in today's medical record. Physical Exam: General: Well developed , well nourished, no acute distress Abdominal exam there is some laxity of the abdominal wall in the left inguinal area but no frank hernia

## 2012-06-17 NOTE — Assessment & Plan Note (Addendum)
Improve with medical therapy. Plan to continue with the same. He will also be scheduled for colonoscopy which he is overdue for.

## 2012-06-18 ENCOUNTER — Telehealth: Payer: Self-pay | Admitting: Gastroenterology

## 2012-06-19 NOTE — Telephone Encounter (Signed)
Nexium approved for 36 months. Called patient to inform him

## 2012-06-22 ENCOUNTER — Telehealth: Payer: Self-pay | Admitting: Gastroenterology

## 2012-06-22 NOTE — Telephone Encounter (Signed)
Discussed Nexium with patient and the cost , Patient stated it is cheaper OTC than through his insurance

## 2012-07-07 ENCOUNTER — Telehealth: Payer: Self-pay | Admitting: Internal Medicine

## 2012-07-07 DIAGNOSIS — E119 Type 2 diabetes mellitus without complications: Secondary | ICD-10-CM

## 2012-07-07 NOTE — Telephone Encounter (Signed)
Pt had asked for rx for med to quit smoking. Dr. Cato Mulligan told him he had to complete 3 "quit smoking" classes. Pt just completed 3rd class and would like rx for whatever med Dr. Cato Mulligan recommends.  Please send rx to [facility] on [address].  If we have any samples, he would like that as well.

## 2012-07-09 ENCOUNTER — Telehealth: Payer: Self-pay | Admitting: Internal Medicine

## 2012-07-09 MED ORDER — VARENICLINE TARTRATE 0.5 MG X 11 & 1 MG X 42 PO MISC: ORAL | Status: AC

## 2012-07-09 MED ORDER — VARENICLINE TARTRATE 1 MG PO TABS: 1.0000 mg | ORAL_TABLET | Freq: Two times a day (BID) | ORAL | Status: DC

## 2012-07-09 NOTE — Telephone Encounter (Signed)
Can you call pt to schedule appt.  

## 2012-07-09 NOTE — Telephone Encounter (Signed)
Coupon up front for p/u, pt aware

## 2012-07-09 NOTE — Telephone Encounter (Signed)
I called pt and LMOM about rx at pharmacy & asked him to call me and sched labs/OV.

## 2012-07-09 NOTE — Telephone Encounter (Signed)
PT is requesting coupons for Chantix RX, as it was 50$ a pack when he went to pick up 2 packs. Please assist.

## 2012-07-09 NOTE — Telephone Encounter (Signed)
chantix sent in-- have him read package insert for risks  Make sure he has appointment in next two months-- f/u dm

## 2012-07-27 ENCOUNTER — Telehealth: Payer: Self-pay | Admitting: Internal Medicine

## 2012-07-27 NOTE — Telephone Encounter (Signed)
Patient Information:  Caller Name: Aubry  Phone: [phone]  Patient: Christian Roberts  Gender: Male  DOB: 02-21-1945  Age: 67 Years  PCP: Birdie Sons (Adults only)  Office Follow Up:  Does the office need to follow up with this patient?: Yes  Instructions For The Office: Please call patient and advise if he can be seen in office today. He is having some mild to moderate constant abdominal pain d/t constipation. Hx Hernia repair and area is tender to touch. He would like to try stronger laxative than Miralax and enema. Please advise.   Symptoms  Reason For Call & Symptoms: Started on Chantrix 3 weeks ago and he thinks it is causing constipation.  Last  bm on 07/19/12. He is taking Miralax daily and eating fruit and not helping. Area where he had hernia repair last year is tender to touch and he is gassy and feels nauseous and is boated. Afebrile.  Reviewed Health History In EMR: Yes  Reviewed Medications In EMR: Yes  Reviewed Allergies In EMR: Yes  Reviewed Surgeries / Procedures: Yes  Date of Onset of Symptoms: 07/19/2012  Treatments Tried: Miralax, increasing fiber in diet  Treatments Tried Worked: No  Guideline(s) Used:  Constipation  Abdominal Pain - Male  Disposition Per Guideline:   Go to Office Now  Reason For Disposition Reached:   Constant abdominal pain lasting > 2 hours  Advice Given:  Pass A BM:  Sit on the toilet and try to pass a bowel movement (BM). Do not strain. This may relieve pain if it is due to constipation or impending diarrhea.  Call Back If:  Abdominal pain is constant and present for more than 2 hours  Abdominal pains come and go and are present for more than 24 hours  You become worse.  Enemas:  Should be used rarely and only after other measures have not worked.  General Constipation Instructions:  Eat a high fiber diet.  Drink adequate liquids.  Exercise regularly (even a daily 15 minute walk!).  Get into a rhythm - try to have a BM at  the same time each day.  Don't ignore your body's signals to have a BM.  Liquids:  Drink 6-8 glasses of water a day (Caution: certain medical conditions require fluid restriction).  Prune juice is a natural laxative.  Avoid alcohol.  Osmotic Laxatives:  Milk of Magnesia (magnesium hydroxide): This is a mild and generally safe laxative. You can use Milk of Magnesia for short-term treatment of constipation. (Research suggests that Miralax may be more effective.) Dosage is 2 tablespoons (30 ml) PO. Do not use if you have kidney disease.  Enemas:  Should be used rarely and only after other measures have not worked.  Call Back If:  Constipation continues (i.e., less than 3 BMs / week or straining more than 25% of the time) after following care advice for constipation for 2 weeks  You become worse  Patient Will Follow Care Advice:  YES

## 2012-07-27 NOTE — Telephone Encounter (Signed)
Scheduled appt on 07/28/12 at 3:30

## 2012-07-28 ENCOUNTER — Encounter: Payer: Self-pay | Admitting: Internal Medicine

## 2012-07-28 ENCOUNTER — Ambulatory Visit (INDEPENDENT_AMBULATORY_CARE_PROVIDER_SITE_OTHER): Payer: BC Managed Care – PPO | Admitting: Internal Medicine

## 2012-07-28 VITALS — BP 130/70 | HR 90 | Temp 98.4°F | Resp 20 | Wt 130.0 lb

## 2012-07-28 DIAGNOSIS — K5900 Constipation, unspecified: Secondary | ICD-10-CM

## 2012-07-28 DIAGNOSIS — K4090 Unilateral inguinal hernia, without obstruction or gangrene, not specified as recurrent: Secondary | ICD-10-CM

## 2012-07-28 NOTE — Patient Instructions (Signed)

## 2012-07-28 NOTE — Progress Notes (Signed)
Subjective:    Patient ID: Christian Roberts, male    DOB: Oct 19, 1945, 67 y.o.   MRN: [id number]  HPI  67 year old patient who is seen today complaining of constipation and a recurrent left inguinal hernia. He is status post left angle hernia repair in March of last year. He's had some recent constipation issues with increasing bulging on the left at times this is slightly painful. He is scheduled for a followup colonoscopy soon  Past Medical History  Diagnosis Date  . Allergic rhinitis   . Elevated lipids   . GERD (gastroesophageal reflux disease)   . Hyperlipidemia   . Left ankle pain     chronic  . Bronchitis   . Hernia   . Diabetes mellitus     diet controlled - since 2012  . Headache(784.0)     headaches-related to sinuses & uses Tylenol   . Skin cancer, basal cell   . Osteoarthritis     ankle- left, hands & neck & hip  . Asthma   . Neck pain     History   Social History  . Marital Status: Married    Spouse Name: N/A    Number of Children: 3  . Years of Education: N/A   Occupational History  . Not on file.   Social History Main Topics  . Smoking status: Former Smoker    Types: Cigarettes    Quit date: 04/10/2012  . Smokeless tobacco: Never Used     Comment: quitting  . Alcohol Use: No  . Drug Use: No  . Sexually Active: Not on file   Other Topics Concern  . Not on file   Social History Narrative   Married   Daily caffeine use- 6   Patient does not get regular exercise    Past Surgical History  Procedure Laterality Date  . Ankle surgery Left   . Esophagogastroduodenoscopy  06/27/2006  . Tonsillectomy    . Varicocelectomy    . Left ankle mass removed  1972    3 surgeries on L ankle- 73, 2008, 2009  . Neck surgery  03/11,     C3-C7  . Back surgery      cerv. fusion C1- T1, 2 separate surgeries , T1-T3 2014  . Appendectomy      1964  . Cystoscopy      multiple times for infections  . Inguinal hernia repair  03/27/2011    Procedure: HERNIA  REPAIR INGUINAL ADULT;  Surgeon: Shelly Rubenstein, MD;  Location: [facility] OR;  Service: General;  Laterality: Left;  . Carpal tunnel release Right     Family History  Problem Relation Age of Onset  . Stroke Mother   . Anesthesia problems Mother   . Cerebral aneurysm Father   . Diabetes Sister     hypoglycemia  . Breast cancer Sister     x 2  . Lung cancer Sister   . Emphysema Sister     Allergies  Allergen Reactions  . Aspirin Anaphylaxis  . Codeine Phosphate Anaphylaxis  . Ibuprofen Anaphylaxis  . Morphine Sulfate Anaphylaxis  . Naproxen Sodium Anaphylaxis  . Nsaids Anaphylaxis  . Sulfamethoxazole Anaphylaxis  . Levofloxacin Itching    Current Outpatient Prescriptions on File Prior to Visit  Medication Sig Dispense Refill  . acetaminophen (TYLENOL) 500 MG tablet Take 500-1,000 mg by mouth every 6 (six) hours as needed. For pain      . AMBULATORY NON FORMULARY MEDICATION Nitroglycerine ointment 0.125 %  Apply a  pea sized amount internally two to three times a day Dispense 30 GM zero refill  30 g  0  . atorvastatin (LIPITOR) 20 MG tablet Take 1 tablet (20 mg total) by mouth daily.  90 tablet  3  . diazepam (VALIUM) 5 MG tablet Take 5 mg by mouth every 12 (twelve) hours as needed for anxiety.      Marland Kitchen esomeprazole (NEXIUM) 40 MG capsule Take 1 capsule (40 mg total) by mouth daily before breakfast.  30 capsule  3  . fish oil-omega-3 fatty acids 1000 MG capsule Take 1 g by mouth 2 (two) times daily.       Marland Kitchen lidocaine (LIDODERM) 5 % Place 0.5 patches onto the skin daily. Remove & Discard patch within 12 hours or as directed by MD      . metFORMIN (GLUCOPHAGE) 500 MG tablet Take 1 tablet (500 mg total) by mouth 2 (two) times daily with a meal.  60 tablet  2  . polycarbophil (FIBERCON) 625 MG tablet Take 625 mg by mouth daily.      . pseudoephedrine (SUDAFED) 30 MG tablet Take 30 mg by mouth every 4 (four) hours as needed. For congestion      . sodium chloride (OCEAN) 0.65 % nasal  spray Place 1 spray into the nose daily as needed. For nasal congestion      . solifenacin (VESICARE) 5 MG tablet Take 10 mg by mouth every morning.       . Tamsulosin HCl (FLOMAX) 0.4 MG CAPS Take 0.4 mg by mouth daily.      Melene Muller ON 08/09/2012] varenicline (CHANTIX CONTINUING MONTH PAK) 1 MG tablet Take 1 tablet (1 mg total) by mouth 2 (two) times daily. Starting after starter pack complete  90 tablet  0  . varenicline (CHANTIX STARTING MONTH PAK) 0.5 MG X 11 & 1 MG X 42 tablet Take one 0.5 mg tablet by mouth once daily for 3 days, then increase to one 0.5 mg tablet twice daily for 4 days, then increase to one 1 mg tablet twice daily.  53 tablet  0  . albuterol (PROAIR HFA) 108 (90 BASE) MCG/ACT inhaler Inhale 2 puffs into the lungs every 4 (four) hours as needed. For shortness of breath      . oxyCODONE (OXY IR/ROXICODONE) 5 MG immediate release tablet Take 5 mg by mouth every 4 (four) hours as needed for pain.       No current facility-administered medications on file prior to visit.    BP 130/70  Pulse 90  Temp(Src) 98.4 F (36.9 C) (Oral)  Resp 20  Wt 130 lb (58.968 kg)  BMI 22.3 kg/m2  SpO2 97%       Review of Systems  Constitutional: Negative for fever, chills, appetite change and fatigue.  HENT: Negative for hearing loss, ear pain, congestion, sore throat, trouble swallowing, neck stiffness, dental problem, voice change and tinnitus.   Eyes: Negative for pain, discharge and visual disturbance.  Respiratory: Negative for cough, chest tightness, wheezing and stridor.   Cardiovascular: Negative for chest pain, palpitations and leg swelling.  Gastrointestinal: Positive for constipation. Negative for nausea, vomiting, abdominal pain, diarrhea, blood in stool and abdominal distention.  Genitourinary: Negative for urgency, hematuria, flank pain, discharge, difficulty urinating and genital sores.  Musculoskeletal: Negative for myalgias, back pain, joint swelling, arthralgias and  gait problem.  Skin: Negative for rash.  Neurological: Negative for dizziness, syncope, speech difficulty, weakness, numbness and headaches.  Hematological: Negative for adenopathy. Does  not bruise/bleed easily.  Psychiatric/Behavioral: Negative for behavioral problems and dysphoric mood. The patient is not nervous/anxious.        Objective:   Physical Exam  Constitutional: He appears well-nourished. No distress.  Abdominal: Soft. Bowel sounds are normal. He exhibits no distension. There is no tenderness. There is no rebound.  Easily reducible small left inguinal hernia          Assessment & Plan:   Recurrent left inguinal hernia  surgical referral if becomes more symptomatic Constipation. The constipation measures was discussed at length. Information dispensed. He'll have followup colonoscopy

## 2012-08-03 ENCOUNTER — Telehealth: Payer: Self-pay | Admitting: Internal Medicine

## 2012-08-03 DIAGNOSIS — E785 Hyperlipidemia, unspecified: Secondary | ICD-10-CM

## 2012-08-03 MED ORDER — ATORVASTATIN CALCIUM 20 MG PO TABS: 20.0000 mg | ORAL_TABLET | Freq: Every day | ORAL | Status: DC

## 2012-08-03 NOTE — Telephone Encounter (Signed)
Rx sent to pharmacy for 3 month supply

## 2012-08-03 NOTE — Telephone Encounter (Signed)
Patient Information:  Caller Name: Chip  Phone: [phone]  Patient: Christian Roberts, Christian Roberts  Gender: Male  DOB: [birth year]  Age: 67 Years  PCP: Birdie Sons (Adults only)  Office Follow Up:  Does the office need to follow up with this patient?: Yes  Instructions For The Office: pt is requesting  rx rf for his Liptor, but needs sent to different pharmacy.  RN Note:  RN advised pt that his rx request will be sent to Dr. Cato Mulligan.  Symptoms  Reason For Call & Symptoms: Today, 08/03/2012, pt transferred to triage nurse  stating he is out of his Lipitor --  Rx needs to be sent to [facility] instead of [facility] like in the past.   Last  rf was  12/2011.   Pt stating he has been  taking his wife's rx to help with cost. Pt has 2 month FU scheduled with Dr. Cato Mulligan 09/11/2012.  Reviewed Health History In EMR: Yes  Reviewed Medications In EMR: Yes  Reviewed Allergies In EMR: Yes  Reviewed Surgeries / Procedures: Yes  Date of Onset of Symptoms: Unknown  Guideline(s) Used:  No Protocol Available - Information Only  Disposition Per Guideline:   Callback by PCP Today  Reason For Disposition Reached:   Nursing judgment  Advice Given:  N/A  Patient Will Follow Care Advice:  YES

## 2012-08-17 ENCOUNTER — Encounter: Payer: BC Managed Care – PPO | Admitting: Family Medicine

## 2012-08-17 ENCOUNTER — Ambulatory Visit (INDEPENDENT_AMBULATORY_CARE_PROVIDER_SITE_OTHER): Payer: BC Managed Care – PPO | Admitting: Internal Medicine

## 2012-08-17 ENCOUNTER — Ambulatory Visit (INDEPENDENT_AMBULATORY_CARE_PROVIDER_SITE_OTHER)
Admission: RE | Admit: 2012-08-17 | Discharge: 2012-08-17 | Disposition: A | Discharge status: Home / Self Care | Payer: BC Managed Care – PPO | Source: Ambulatory Visit | Attending: Internal Medicine | Admitting: Internal Medicine

## 2012-08-17 ENCOUNTER — Encounter: Payer: Self-pay | Admitting: Internal Medicine

## 2012-08-17 VITALS — BP 100/60 | HR 91 | Temp 97.9°F | Wt 132.0 lb

## 2012-08-17 DIAGNOSIS — M25512 Pain in left shoulder: Secondary | ICD-10-CM

## 2012-08-17 DIAGNOSIS — Y92009 Unspecified place in unspecified non-institutional (private) residence as the place of occurrence of the external cause: Secondary | ICD-10-CM

## 2012-08-17 DIAGNOSIS — W19XXXA Unspecified fall, initial encounter: Secondary | ICD-10-CM

## 2012-08-17 DIAGNOSIS — M25519 Pain in unspecified shoulder: Secondary | ICD-10-CM

## 2012-08-17 DIAGNOSIS — T148XXA Other injury of unspecified body region, initial encounter: Secondary | ICD-10-CM

## 2012-08-17 MED ORDER — CYCLOBENZAPRINE HCL 10 MG PO TABS: 10.0000 mg | ORAL_TABLET | Freq: Three times a day (TID) | ORAL | Status: DC | PRN

## 2012-08-17 MED ORDER — LIDOCAINE 5 % EX PTCH: 0.5000 | MEDICATED_PATCH | CUTANEOUS | Status: DC

## 2012-08-17 NOTE — Progress Notes (Signed)
Error   This encounter was created in error - please disregard. 

## 2012-08-17 NOTE — Patient Instructions (Signed)
Back Exercises These exercises may help you when beginning to rehabilitate your injury. Your symptoms may resolve with or without further involvement from your physician, physical therapist or athletic trainer. While completing these exercises, remember:   Restoring tissue flexibility helps normal motion to return to the joints. This allows healthier, less painful movement and activity.  An effective stretch should be held for at least 30 seconds.  A stretch should never be painful. You should only feel a gentle lengthening or release in the stretched tissue. STRETCH  Extension, Prone on Elbows   Lie on your stomach on the floor, a bed will be too soft. Place your palms about shoulder width apart and at the height of your head.  Place your elbows under your shoulders. If this is too painful, stack pillows under your chest.  Allow your body to relax so that your hips drop lower and make contact more completely with the floor.  Hold this position for __________ seconds.  Slowly return to lying flat on the floor. Repeat __________ times. Complete this exercise __________ times per day.  RANGE OF MOTION  Extension, Prone Press Ups   Lie on your stomach on the floor, a bed will be too soft. Place your palms about shoulder width apart and at the height of your head.  Keeping your back as relaxed as possible, slowly straighten your elbows while keeping your hips on the floor. You may adjust the placement of your hands to maximize your comfort. As you gain motion, your hands will come more underneath your shoulders.  Hold this position __________ seconds.  Slowly return to lying flat on the floor. Repeat __________ times. Complete this exercise __________ times per day.  RANGE OF MOTION- Quadruped, Neutral Spine   Assume a hands and knees position on a firm surface. Keep your hands under your shoulders and your knees under your hips. You may place padding under your knees for comfort.  Drop  your head and point your tail bone toward the ground below you. This will round out your low back like an angry cat. Hold this position for __________ seconds.  Slowly lift your head and release your tail bone so that your back sags into a large arch, like an old horse.  Hold this position for __________ seconds.  Repeat this until you feel limber in your low back.  Now, find your "sweet spot." This will be the most comfortable position somewhere between the two previous positions. This is your neutral spine. Once you have found this position, tense your stomach muscles to support your low back.  Hold this position for __________ seconds. Repeat __________ times. Complete this exercise __________ times per day.  STRETCH  Flexion, Single Knee to Chest   Lie on a firm bed or floor with both legs extended in front of you.  Keeping one leg in contact with the floor, bring your opposite knee to your chest. Hold your leg in place by either grabbing behind your thigh or at your knee.  Pull until you feel a gentle stretch in your low back. Hold __________ seconds.  Slowly release your grasp and repeat the exercise with the opposite side. Repeat __________ times. Complete this exercise __________ times per day.  STRETCH - Hamstrings, Standing  Stand or sit and extend your right / left leg, placing your foot on a chair or foot stool  Keeping a slight arch in your low back and your hips straight forward.  Lead with your chest and   lean forward at the waist until you feel a gentle stretch in the back of your right / left knee or thigh. (When done correctly, this exercise requires leaning only a small distance.)  Hold this position for __________ seconds. Repeat __________ times. Complete this stretch __________ times per day. STRENGTHENING  Deep Abdominals, Pelvic Tilt   Lie on a firm bed or floor. Keeping your legs in front of you, bend your knees so they are both pointed toward the ceiling and  your feet are flat on the floor.  Tense your lower abdominal muscles to press your low back into the floor. This motion will rotate your pelvis so that your tail bone is scooping upwards rather than pointing at your feet or into the floor.  With a gentle tension and even breathing, hold this position for __________ seconds. Repeat __________ times. Complete this exercise __________ times per day.  STRENGTHENING  Abdominals, Crunches   Lie on a firm bed or floor. Keeping your legs in front of you, bend your knees so they are both pointed toward the ceiling and your feet are flat on the floor. Cross your arms over your chest.  Slightly tip your chin down without bending your neck.  Tense your abdominals and slowly lift your trunk high enough to just clear your shoulder blades. Lifting higher can put excessive stress on the low back and does not further strengthen your abdominal muscles.  Control your return to the starting position. Repeat __________ times. Complete this exercise __________ times per day.  STRENGTHENING  Quadruped, Opposite UE/LE Lift   Assume a hands and knees position on a firm surface. Keep your hands under your shoulders and your knees under your hips. You may place padding under your knees for comfort.  Find your neutral spine and gently tense your abdominal muscles so that you can maintain this position. Your shoulders and hips should form a rectangle that is parallel with the floor and is not twisted.  Keeping your trunk steady, lift your right hand no higher than your shoulder and then your left leg no higher than your hip. Make sure you are not holding your breath. Hold this position __________ seconds.  Continuing to keep your abdominal muscles tense and your back steady, slowly return to your starting position. Repeat with the opposite arm and leg. Repeat __________ times. Complete this exercise __________ times per day. Document Released: 01/11/2005 Document  Revised: 03/18/2011 Document Reviewed: 04/07/2008 [employer] Patient Information 2014 [employer].  

## 2012-08-17 NOTE — Progress Notes (Signed)
Subjective:    Patient ID: Christian Roberts, male    DOB: 05-30-1945, 67 y.o.   MRN: [id number]  HPI  Pt presents to the clinic today s/p fall. This occurred 1 week ago. He fell backwards off a ladder, landed flat on his neck. He did his head. He did not lose consciousness. He did goes see his Neurologist who performed his cervical fusion. They did perform an xray which is unchanged. The pain is in the left shoulder and lower back. The pain is worse when he moves, bend or twist. He has taken oxycodone, valium. These did not seem to ease the pain off at all.  Review of Systems      Past Medical History  Diagnosis Date  . Allergic rhinitis   . Elevated lipids   . GERD (gastroesophageal reflux disease)   . Hyperlipidemia   . Left ankle pain     chronic  . Bronchitis   . Hernia   . Diabetes mellitus     diet controlled - since 2012  . Headache(784.0)     headaches-related to sinuses & uses Tylenol   . Skin cancer, basal cell   . Osteoarthritis     ankle- left, hands & neck & hip  . Asthma   . Neck pain     Current Outpatient Prescriptions  Medication Sig Dispense Refill  . acetaminophen (TYLENOL) 500 MG tablet Take 500-1,000 mg by mouth every 6 (six) hours as needed. For pain      . albuterol (PROAIR HFA) 108 (90 BASE) MCG/ACT inhaler Inhale 2 puffs into the lungs every 4 (four) hours as needed. For shortness of breath      . AMBULATORY NON FORMULARY MEDICATION Nitroglycerine ointment 0.125 %  Apply a pea sized amount internally two to three times a day Dispense 30 GM zero refill  30 g  0  . atorvastatin (LIPITOR) 20 MG tablet Take 1 tablet (20 mg total) by mouth daily.  90 tablet  0  . diazepam (VALIUM) 5 MG tablet Take 5 mg by mouth every 12 (twelve) hours as needed for anxiety.      Marland Kitchen esomeprazole (NEXIUM) 40 MG capsule Take 1 capsule (40 mg total) by mouth daily before breakfast.  30 capsule  3  . fish oil-omega-3 fatty acids 1000 MG capsule Take 1 g by mouth 2 (two) times  daily.       Marland Kitchen gabapentin (NEURONTIN) 300 MG capsule Take 300 mg by mouth 3 (three) times daily.      Marland Kitchen lidocaine (LIDODERM) 5 % Place 0.5 patches onto the skin daily. Remove & Discard patch within 12 hours or as directed by MD      . magnesium hydroxide (MILK OF MAGNESIA) 400 MG/5ML suspension Take 5 mLs by mouth daily as needed for constipation.      . metFORMIN (GLUCOPHAGE) 500 MG tablet Take 1 tablet (500 mg total) by mouth 2 (two) times daily with a meal.  60 tablet  2  . oxyCODONE (OXY IR/ROXICODONE) 5 MG immediate release tablet Take 5 mg by mouth every 4 (four) hours as needed for pain.      . polycarbophil (FIBERCON) 625 MG tablet Take 625 mg by mouth daily.      . pseudoephedrine (SUDAFED) 30 MG tablet Take 30 mg by mouth every 4 (four) hours as needed. For congestion      . sodium chloride (OCEAN) 0.65 % nasal spray Place 1 spray into the nose daily as needed. For  nasal congestion      . solifenacin (VESICARE) 5 MG tablet Take 10 mg by mouth every morning.       . Tamsulosin HCl (FLOMAX) 0.4 MG CAPS Take 0.4 mg by mouth daily.      . varenicline (CHANTIX CONTINUING MONTH PAK) 1 MG tablet Take 1 tablet (1 mg total) by mouth 2 (two) times daily. Starting after starter pack complete  90 tablet  0   No current facility-administered medications for this visit.    Allergies  Allergen Reactions  . Aspirin Anaphylaxis  . Codeine Phosphate Anaphylaxis  . Ibuprofen Anaphylaxis  . Morphine Sulfate Anaphylaxis  . Naproxen Sodium Anaphylaxis  . Nsaids Anaphylaxis  . Sulfamethoxazole Anaphylaxis  . Levofloxacin Itching    Family History  Problem Relation Age of Onset  . Stroke Mother   . Anesthesia problems Mother   . Cerebral aneurysm Father   . Diabetes Sister     hypoglycemia  . Breast cancer Sister     x 2  . Lung cancer Sister   . Emphysema Sister     History   Social History  . Marital Status: Married    Spouse Name: N/A    Number of Children: 3  . Years of  Education: N/A   Occupational History  . Not on file.   Social History Main Topics  . Smoking status: Former Smoker    Types: Cigarettes    Quit date: 04/10/2012  . Smokeless tobacco: Never Used     Comment: quitting  . Alcohol Use: No  . Drug Use: No  . Sexually Active: Not on file   Other Topics Concern  . Not on file   Social History Narrative   Married   Daily caffeine use- 6   Patient does not get regular exercise     Constitutional: Denies fever, malaise, fatigue, headache or abrupt weight changes.  Musculoskeletal: Pt reports back pain and left shoulder. Denies decrease in range of motion, difficulty with gait, muscle pain or joint swelling.  Skin: Denies redness, rashes, lesions or ulcercations.  Neurological: Denies dizziness, difficulty with memory, difficulty with speech or problems with balance and coordination.   No other specific complaints in a complete review of systems (except as listed in HPI above).  Objective:   Physical Exam  BP 100/60  Pulse 91  Temp(Src) 97.9 F (36.6 C) (Oral)  Wt 132 lb (59.875 kg)  BMI 22.65 kg/m2  SpO2 97% Wt Readings from Last 3 Encounters:  08/17/12 132 lb (59.875 kg)  07/28/12 130 lb (58.968 kg)  06/17/12 131 lb 12.8 oz (59.784 kg)    General: Appears her stated age, well developed, well nourished in NAD. Skin: Warm, dry and intact. No rashes, lesions or ulcerations noted..  Cardiovascular: Normal rate and rhythm. S1,S2 noted.  No murmur, rubs or gallops noted. No JVD or BLE edema. No carotid bruits noted. Pulmonary/Chest: Normal effort and positive vesicular breath sounds. No respiratory distress. No wheezes, rales or ronchi noted.  Musculoskeletal: Normal range of motion. No signs of joint swelling. No difficulty with gait. Positive drop can test on the left. Neurological: Alert and oriented. Cranial nerves II-XII intact. Coordination normal. +DTRs bilaterally.   BMET    Component Value Date/Time   NA 140  12/23/2011 1522   K 4.5 12/23/2011 1522   CL 100 12/23/2011 1522   CO2 30 12/23/2011 1522   GLUCOSE 94 12/23/2011 1522   BUN 15 12/23/2011 1522   CREATININE 1.0 12/23/2011 1522  CALCIUM 9.7 12/23/2011 1522   GFRNONAA 89* 03/20/2011 1317   GFRAA >90 03/20/2011 1317    Lipid Panel     Component Value Date/Time   CHOL 215* 12/23/2011 1522   TRIG 128.0 12/23/2011 1522   HDL 57.80 12/23/2011 1522   CHOLHDL 4 12/23/2011 1522   VLDL 25.6 12/23/2011 1522   LDLCALC 98 05/07/2011 0835    CBC    Component Value Date/Time   WBC 7.4 08/07/2011 1011   RBC 4.43 08/07/2011 1011   HGB 13.1 08/07/2011 1011   HCT 39.2 08/07/2011 1011   PLT 310.0 08/07/2011 1011   MCV 88.5 08/07/2011 1011   MCH 29.1 03/20/2011 1317   MCHC 33.4 08/07/2011 1011   RDW 14.4 08/07/2011 1011   LYMPHSABS 2.6 08/07/2011 1011   MONOABS 0.6 08/07/2011 1011   EOSABS 0.2 08/07/2011 1011   BASOSABS 0.1 08/07/2011 1011    Hgb A1C Lab Results  Component Value Date   HGBA1C 7.6* 05/20/2012         Assessment & Plan:   Left shoulder pain ? For rotator cuff tear:  Will obtain xray and will most likely need MRI Continue oxycodone for pain  Muscle strain of lower back:  Erx for flexeril Perform back exercises as indicated on handout Continue tylenol for pain.  RTC as needed

## 2012-08-18 ENCOUNTER — Telehealth: Payer: Self-pay | Admitting: *Deleted

## 2012-08-18 ENCOUNTER — Other Ambulatory Visit: Payer: Self-pay | Admitting: Internal Medicine

## 2012-08-18 DIAGNOSIS — M25512 Pain in left shoulder: Secondary | ICD-10-CM

## 2012-08-18 NOTE — Telephone Encounter (Signed)
Spoke with pt.  Advised of result note.

## 2012-08-25 ENCOUNTER — Telehealth: Payer: Self-pay | Admitting: *Deleted

## 2012-08-25 ENCOUNTER — Other Ambulatory Visit: Payer: BC Managed Care – PPO

## 2012-08-25 ENCOUNTER — Other Ambulatory Visit: Payer: Self-pay | Admitting: Internal Medicine

## 2012-08-25 ENCOUNTER — Ambulatory Visit
Admission: RE | Admit: 2012-08-25 | Discharge: 2012-08-25 | Disposition: A | Discharge status: Home / Self Care | Payer: BC Managed Care – PPO | Source: Ambulatory Visit | Attending: Internal Medicine | Admitting: Internal Medicine

## 2012-08-25 DIAGNOSIS — M25512 Pain in left shoulder: Secondary | ICD-10-CM

## 2012-08-25 DIAGNOSIS — M75102 Unspecified rotator cuff tear or rupture of left shoulder, not specified as traumatic: Secondary | ICD-10-CM

## 2012-08-25 NOTE — Telephone Encounter (Signed)
Referral placed.

## 2012-08-25 NOTE — Telephone Encounter (Signed)
Called pt and told him that he does have a rotator cuff tear as well as a tear of his bicep tendon. Does he have a orthopedist or does he need a referral?

## 2012-08-25 NOTE — Telephone Encounter (Signed)
Pt called states he does need a referral but does not want to go to [facility].  Please advise

## 2012-08-26 ENCOUNTER — Other Ambulatory Visit: Payer: BC Managed Care – PPO

## 2012-09-03 ENCOUNTER — Other Ambulatory Visit (INDEPENDENT_AMBULATORY_CARE_PROVIDER_SITE_OTHER): Payer: BC Managed Care – PPO

## 2012-09-03 DIAGNOSIS — E119 Type 2 diabetes mellitus without complications: Secondary | ICD-10-CM

## 2012-09-03 LAB — HEMOGLOBIN A1C: Hgb A1c MFr Bld: 6.9 % — ABNORMAL HIGH (ref 4.6–6.5)

## 2012-09-03 LAB — BASIC METABOLIC PANEL
CO2: 31 mEq/L (ref 19–32)
Chloride: 102 mEq/L (ref 96–112)
Creatinine, Ser: 0.9 mg/dL (ref 0.4–1.5)
Potassium: 4.2 mEq/L (ref 3.5–5.1)
Sodium: 140 mEq/L (ref 135–145)

## 2012-09-03 LAB — LIPID PANEL
HDL: 57.8 mg/dL (ref >39.00)
LDL Cholesterol: 55 mg/dL (ref 0–99)
Total CHOL/HDL Ratio: 2
Triglycerides: 64 mg/dL (ref 0.0–149.0)

## 2012-09-03 LAB — HEPATIC FUNCTION PANEL
ALT: 19 U/L (ref 0–53)
Alkaline Phosphatase: 55 U/L (ref 39–117)
Bilirubin, Direct: 0 mg/dL (ref 0.0–0.3)
Total Bilirubin: 0.6 mg/dL (ref 0.3–1.2)
Total Protein: 6.8 g/dL (ref 6.0–8.3)

## 2012-09-04 ENCOUNTER — Other Ambulatory Visit: Payer: BC Managed Care – PPO

## 2012-09-11 ENCOUNTER — Telehealth: Payer: Self-pay | Admitting: Internal Medicine

## 2012-09-11 ENCOUNTER — Ambulatory Visit (INDEPENDENT_AMBULATORY_CARE_PROVIDER_SITE_OTHER): Payer: BC Managed Care – PPO | Admitting: Internal Medicine

## 2012-09-11 ENCOUNTER — Encounter: Payer: Self-pay | Admitting: Internal Medicine

## 2012-09-11 ENCOUNTER — Ambulatory Visit: Payer: BC Managed Care – PPO | Admitting: Internal Medicine

## 2012-09-11 VITALS — BP 112/78 | HR 88 | Temp 97.5°F | Wt 131.0 lb

## 2012-09-11 DIAGNOSIS — E119 Type 2 diabetes mellitus without complications: Secondary | ICD-10-CM

## 2012-09-11 DIAGNOSIS — Z23 Encounter for immunization: Secondary | ICD-10-CM

## 2012-09-11 DIAGNOSIS — E785 Hyperlipidemia, unspecified: Secondary | ICD-10-CM

## 2012-09-11 MED ORDER — VARENICLINE TARTRATE 1 MG PO TABS: 1.0000 mg | ORAL_TABLET | Freq: Two times a day (BID) | ORAL | Status: DC

## 2012-09-11 NOTE — Telephone Encounter (Signed)
Pt states he usually has labs prior to appts. Is it ok to schedule?

## 2012-09-11 NOTE — Progress Notes (Signed)
patient comes in for followup of multiple medical problems including type 2 diabetes, hyperlipidemia. The patient does not check blood sugar or blood pressure at home. The patetient does not follow an exercise or diet program. The patient denies any polyuria, polydipsia.  In the past the patient has gone to diabetic treatment center. The patient is tolerating medications  Without difficulty. The patient does admit to medication compliance.   Past Medical History  Diagnosis Date  . Allergic rhinitis   . Elevated lipids   . GERD (gastroesophageal reflux disease)   . Hyperlipidemia   . Left ankle pain     chronic  . Bronchitis   . Hernia   . Diabetes mellitus     diet controlled - since 2012  . Headache(784.0)     headaches-related to sinuses & uses Tylenol   . Skin cancer, basal cell   . Osteoarthritis     ankle- left, hands & neck & hip  . Asthma   . Neck pain     History   Social History  . Marital Status: Married    Spouse Name: N/A    Number of Children: 3  . Years of Education: N/A   Occupational History  . Not on file.   Social History Main Topics  . Smoking status: Former Smoker    Types: Cigarettes    Quit date: 04/10/2012  . Smokeless tobacco: Never Used     Comment: quitting  . Alcohol Use: No  . Drug Use: No  . Sexual Activity: Not on file   Other Topics Concern  . Not on file   Social History Narrative   Married   Daily caffeine use- 6   Patient does not get regular exercise    Past Surgical History  Procedure Laterality Date  . Ankle surgery Left   . Esophagogastroduodenoscopy  06/27/2006  . Tonsillectomy    . Varicocelectomy    . Left ankle mass removed  1972    3 surgeries on L ankle- 73, 2008, 2009  . Neck surgery  03/11,     C3-C7  . Back surgery      cerv. fusion C1- T1, 2 separate surgeries , T1-T3 2014  . Appendectomy      1964  . Cystoscopy      multiple times for infections  . Inguinal hernia repair  03/27/2011    Procedure:  HERNIA REPAIR INGUINAL ADULT;  Surgeon: Shelly Rubenstein, MD;  Location: [facility] OR;  Service: General;  Laterality: Left;  . Carpal tunnel release Right     Family History  Problem Relation Age of Onset  . Stroke Mother   . Anesthesia problems Mother   . Cerebral aneurysm Father   . Diabetes Sister     hypoglycemia  . Breast cancer Sister     x 2  . Lung cancer Sister   . Emphysema Sister     Allergies  Allergen Reactions  . Aspirin Anaphylaxis  . Codeine Phosphate Anaphylaxis  . Ibuprofen Anaphylaxis  . Morphine Sulfate Anaphylaxis  . Naproxen Sodium Anaphylaxis  . Nsaids Anaphylaxis  . Sulfamethoxazole Anaphylaxis  . Levofloxacin Itching    Current Outpatient Prescriptions on File Prior to Visit  Medication Sig Dispense Refill  . acetaminophen (TYLENOL) 500 MG tablet Take 500-1,000 mg by mouth every 6 (six) hours as needed. For pain      . AMBULATORY NON FORMULARY MEDICATION Nitroglycerine ointment 0.125 %  Apply a pea sized amount internally two to three times  a day Dispense 30 GM zero refill  30 g  0  . atorvastatin (LIPITOR) 20 MG tablet Take 1 tablet (20 mg total) by mouth daily.  90 tablet  0  . fish oil-omega-3 fatty acids 1000 MG capsule Take 1 g by mouth 2 (two) times daily.       Marland Kitchen gabapentin (NEURONTIN) 300 MG capsule Take 300 mg by mouth 3 (three) times daily.      Marland Kitchen lidocaine (LIDODERM) 5 % Place 0.5 patches onto the skin daily. Remove & Discard patch within 12 hours or as directed by MD  30 patch  0  . metFORMIN (GLUCOPHAGE) 500 MG tablet Take 1 tablet (500 mg total) by mouth 2 (two) times daily with a meal.  60 tablet  2  . polycarbophil (FIBERCON) 625 MG tablet Take 625 mg by mouth daily.      . pseudoephedrine (SUDAFED) 30 MG tablet Take 30 mg by mouth every 4 (four) hours as needed. For congestion      . sodium chloride (OCEAN) 0.65 % nasal spray Place 1 spray into the nose daily as needed. For nasal congestion      . solifenacin (VESICARE) 5 MG tablet Take  10 mg by mouth every morning.       . Tamsulosin HCl (FLOMAX) 0.4 MG CAPS Take 0.4 mg by mouth daily.      . varenicline (CHANTIX CONTINUING MONTH PAK) 1 MG tablet Take 1 tablet (1 mg total) by mouth 2 (two) times daily. Starting after starter pack complete  90 tablet  0   No current facility-administered medications on file prior to visit.     patient denies chest pain, shortness of breath, orthopnea. Denies lower extremity edema, abdominal pain, change in appetite, change in bowel movements. Patient denies rashes, musculoskeletal complaints. No other specific complaints in a complete review of systems.   BP 112/78  Pulse 88  Temp(Src) 97.5 F (36.4 C) (Oral)  Wt 131 lb (59.421 kg)  BMI 22.47 kg/m2  well-developed well-nourished male in no acute distress. HEENT exam atraumatic, normocephalic, neck supple without jugular venous distention. Chest clear to auscultation cardiac exam S1-S2 are regular. Abdominal exam overweight with bowel sounds, soft and nontender. Extremities no edema. Neurologic exam is alert with a normal gait.

## 2012-09-11 NOTE — Assessment & Plan Note (Signed)
Better controlled- Continue same meds He should not try to gain weight

## 2012-09-11 NOTE — Telephone Encounter (Signed)
Ok bmet, hepatic, lipid, hgba1c

## 2012-09-11 NOTE — Assessment & Plan Note (Signed)
Well controlled Continue same meds 

## 2012-09-14 NOTE — Telephone Encounter (Signed)
done

## 2012-09-17 HISTORY — PX: SHOULDER SURGERY: SHX246

## 2012-10-01 ENCOUNTER — Telehealth: Payer: Self-pay | Admitting: Internal Medicine

## 2012-10-01 NOTE — Telephone Encounter (Signed)
Pt is out of metFORMIN (GLUCOPHAGE) 500 MG tablet (90 day refill) and needs refill sent to  [facility]

## 2012-10-02 MED ORDER — METFORMIN HCL 500 MG PO TABS: 500.0000 mg | ORAL_TABLET | Freq: Two times a day (BID) | ORAL | Status: DC

## 2012-10-02 NOTE — Telephone Encounter (Signed)
rx sent in electronically 

## 2012-10-21 ENCOUNTER — Encounter (HOSPITAL_COMMUNITY): Payer: Self-pay | Admitting: Emergency Medicine

## 2012-10-21 ENCOUNTER — Emergency Department (HOSPITAL_COMMUNITY)
Admission: EM | Admit: 2012-10-21 | Discharge: 2012-10-21 | Disposition: A | Discharge status: Home / Self Care | Payer: BC Managed Care – PPO | Attending: Emergency Medicine | Admitting: Emergency Medicine

## 2012-10-21 ENCOUNTER — Telehealth (INDEPENDENT_AMBULATORY_CARE_PROVIDER_SITE_OTHER): Payer: Self-pay | Admitting: *Deleted

## 2012-10-21 ENCOUNTER — Emergency Department (HOSPITAL_COMMUNITY): Payer: BC Managed Care – PPO

## 2012-10-21 DIAGNOSIS — K5730 Diverticulosis of large intestine without perforation or abscess without bleeding: Secondary | ICD-10-CM | POA: Insufficient documentation

## 2012-10-21 DIAGNOSIS — K219 Gastro-esophageal reflux disease without esophagitis: Secondary | ICD-10-CM | POA: Insufficient documentation

## 2012-10-21 DIAGNOSIS — N281 Cyst of kidney, acquired: Secondary | ICD-10-CM

## 2012-10-21 DIAGNOSIS — Z87891 Personal history of nicotine dependence: Secondary | ICD-10-CM | POA: Insufficient documentation

## 2012-10-21 DIAGNOSIS — M169 Osteoarthritis of hip, unspecified: Secondary | ICD-10-CM | POA: Insufficient documentation

## 2012-10-21 DIAGNOSIS — Z79899 Other long term (current) drug therapy: Secondary | ICD-10-CM | POA: Insufficient documentation

## 2012-10-21 DIAGNOSIS — R109 Unspecified abdominal pain: Secondary | ICD-10-CM | POA: Insufficient documentation

## 2012-10-21 DIAGNOSIS — Z85828 Personal history of other malignant neoplasm of skin: Secondary | ICD-10-CM | POA: Insufficient documentation

## 2012-10-21 DIAGNOSIS — K4020 Bilateral inguinal hernia, without obstruction or gangrene, not specified as recurrent: Secondary | ICD-10-CM | POA: Insufficient documentation

## 2012-10-21 DIAGNOSIS — E119 Type 2 diabetes mellitus without complications: Secondary | ICD-10-CM | POA: Insufficient documentation

## 2012-10-21 DIAGNOSIS — J45909 Unspecified asthma, uncomplicated: Secondary | ICD-10-CM | POA: Insufficient documentation

## 2012-10-21 DIAGNOSIS — Q619 Cystic kidney disease, unspecified: Secondary | ICD-10-CM | POA: Insufficient documentation

## 2012-10-21 DIAGNOSIS — M1610 Unilateral primary osteoarthritis, unspecified hip: Secondary | ICD-10-CM | POA: Insufficient documentation

## 2012-10-21 DIAGNOSIS — R300 Dysuria: Secondary | ICD-10-CM | POA: Insufficient documentation

## 2012-10-21 DIAGNOSIS — K5790 Diverticulosis of intestine, part unspecified, without perforation or abscess without bleeding: Secondary | ICD-10-CM

## 2012-10-21 DIAGNOSIS — K4090 Unilateral inguinal hernia, without obstruction or gangrene, not specified as recurrent: Secondary | ICD-10-CM

## 2012-10-21 DIAGNOSIS — M19079 Primary osteoarthritis, unspecified ankle and foot: Secondary | ICD-10-CM | POA: Insufficient documentation

## 2012-10-21 DIAGNOSIS — M1990 Unspecified osteoarthritis, unspecified site: Secondary | ICD-10-CM | POA: Insufficient documentation

## 2012-10-21 DIAGNOSIS — E785 Hyperlipidemia, unspecified: Secondary | ICD-10-CM | POA: Insufficient documentation

## 2012-10-21 DIAGNOSIS — M19049 Primary osteoarthritis, unspecified hand: Secondary | ICD-10-CM | POA: Insufficient documentation

## 2012-10-21 NOTE — ED Notes (Signed)
Pt reports having a left side inguinal hernia repair in March of 2013. Pt reports that he has developed another left inguinal hernia and was sent by PCP for evaluation due to pervious surgical intervention. Pt is A/O x4 and in NAD, vital signs WDL.

## 2012-10-21 NOTE — ED Provider Notes (Signed)
CSN: [id number]     Arrival date & time 10/21/12  1207 History   First MD Initiated Contact with Patient 10/21/12 1230     Chief Complaint  Patient presents with  . Post-op Problem   (Consider location/radiation/quality/duration/timing/severity/associated sxs/prior Treatment) The history is provided by the patient.    Patient presents with several weeks at site of L inguinal hernia repair.  States that the pain waxes and wanes but is always present and the bulge is sometimes bigger, sometimes smaller.  States currently the swelling is the smallest it has been in weeks.  Reports mild pain presently.  Denies fevers, chills, body aches, N/V/D, change in bowel habits.  He is eating and drinking normally.  States he was sent in by surgeon for CT scan, was told to go to ER.  Per notes, Dr Magnus Ivan was noted to have directed pt to ED to "hopefully" get a CT scan.    Past Medical History  Diagnosis Date  . Allergic rhinitis   . Elevated lipids   . GERD (gastroesophageal reflux disease)   . Hyperlipidemia   . Left ankle pain     chronic  . Bronchitis   . Hernia   . Diabetes mellitus     diet controlled - since 2012  . Headache(784.0)     headaches-related to sinuses & uses Tylenol   . Skin cancer, basal cell   . Osteoarthritis     ankle- left, hands & neck & hip  . Asthma   . Neck pain    Past Surgical History  Procedure Laterality Date  . Ankle surgery Left   . Esophagogastroduodenoscopy  06/27/2006  . Tonsillectomy    . Varicocelectomy    . Left ankle mass removed  1972    3 surgeries on L ankle- 73, 2008, 2009  . Neck surgery  03/11,     C3-C7  . Back surgery      cerv. fusion C1- T1, 2 separate surgeries , T1-T3 2014  . Appendectomy      1964  . Cystoscopy      multiple times for infections  . Inguinal hernia repair  03/27/2011    Procedure: HERNIA REPAIR INGUINAL ADULT;  Surgeon: Shelly Rubenstein, MD;  Location: [facility] OR;  Service: General;  Laterality: Left;  . Carpal  tunnel release Right    Family History  Problem Relation Age of Onset  . Stroke Mother   . Anesthesia problems Mother   . Cerebral aneurysm Father   . Diabetes Sister     hypoglycemia  . Breast cancer Sister     x 2  . Lung cancer Sister   . Emphysema Sister    History  Substance Use Topics  . Smoking status: Former Smoker    Types: Cigarettes    Quit date: 04/10/2012  . Smokeless tobacco: Never Used     Comment: quitting  . Alcohol Use: No    Review of Systems  Constitutional: Negative for fever and chills.  Respiratory: Negative for cough and shortness of breath.   Cardiovascular: Negative for chest pain.  Gastrointestinal: Positive for abdominal pain. Negative for nausea, vomiting and diarrhea.  Endocrine: Positive for polydipsia and polyphagia.  Genitourinary: Positive for dysuria (chronic, unchanged). Negative for urgency, frequency, penile swelling, scrotal swelling, penile pain and testicular pain.  Musculoskeletal: Negative for myalgias.    Allergies  Aspirin; Codeine phosphate; Ibuprofen; Morphine sulfate; Naproxen sodium; Nsaids; Sulfamethoxazole; and Levofloxacin  Home Medications   Current Outpatient Rx  Name  Route  Sig  Dispense  Refill  . acetaminophen (TYLENOL) 500 MG tablet   Oral   Take 500-1,000 mg by mouth every 6 (six) hours as needed for pain. For pain         . atorvastatin (LIPITOR) 20 MG tablet   Oral   Take 20 mg by mouth daily.         . fish oil-omega-3 fatty acids 1000 MG capsule   Oral   Take 1 g by mouth 2 (two) times daily.          Marland Kitchen gabapentin (NEURONTIN) 300 MG capsule   Oral   Take 300 mg by mouth 3 (three) times daily.         . lansoprazole (PREVACID) 30 MG capsule   Oral   Take 30 mg by mouth daily.         . metFORMIN (GLUCOPHAGE) 500 MG tablet   Oral   Take 500 mg by mouth 2 (two) times daily with a meal.         . polycarbophil (FIBERCON) 625 MG tablet   Oral   Take 625 mg by mouth daily.           . pseudoephedrine (SUDAFED) 30 MG tablet   Oral   Take 30 mg by mouth every 4 (four) hours as needed for congestion. For congestion         . solifenacin (VESICARE) 5 MG tablet   Oral   Take 10 mg by mouth every morning.          . Tamsulosin HCl (FLOMAX) 0.4 MG CAPS   Oral   Take 0.4 mg by mouth daily.         . varenicline (CHANTIX CONTINUING MONTH PAK) 1 MG tablet   Oral   Take 1 tablet (1 mg total) by mouth 2 (two) times daily. Starting after starter pack complete   30 tablet   0    BP 135/70  Pulse 100  Temp(Src) 98.4 F (36.9 C) (Oral)  Resp 20  SpO2 99% Physical Exam  Nursing note and vitals reviewed. Constitutional: He appears well-developed and well-nourished. No distress.  HENT:  Head: Normocephalic and atraumatic.  Neck: Neck supple.  Cardiovascular: Normal rate and regular rhythm.   Pulmonary/Chest: Effort normal and breath sounds normal. No respiratory distress. He has no wheezes. He has no rales.  Abdominal: Soft. He exhibits no distension and no mass. There is no tenderness. There is no rebound and no guarding.  Genitourinary:     Neurological: He is alert. He exhibits normal muscle tone.  Skin: He is not diaphoretic.    ED Course  Procedures (including critical care time) Labs Review Labs Reviewed - No data to display Imaging Review Ct Abdomen Pelvis Wo Contrast  10/21/2012   CLINICAL DATA:  Low pelvic pain.  EXAM: CT ABDOMEN AND PELVIS WITHOUT CONTRAST  TECHNIQUE: Multidetector CT imaging of the abdomen and pelvis was performed following the standard protocol without intravenous contrast. IV contrast not administered due to questionable contrast allergy.  COMPARISON:  None  FINDINGS: The noncontrast CT appearance of the liver, spleen, pancreas, and adrenal glands is within normal limits. Gallbladder contracted. No renal calculus observed.  Mildly complex left exophytic lesions include a 4.3 x 5.4 cm anterior lesion. These are likely complex  cysts are although are not completely characterized due to lack of IV contrast.  Urinary bladder unremarkable.  Left inguinal hernia contains a small loop of small  bowel adjacent to the sigmoid colon. A small right inguinal hernia contains adipose tissue.  No findings of strangulation or bowel obstruction. No fluid in the hernia sac.  Bridging spurring of the left sacroiliac joint noted. Lower lumbar facet arthropathy and degenerative disc disease.  IMPRESSION: 1. Left inguinal hernia contains a loop of small bowel, without findings of obstruction or strangulation. Very small right inguinal hernia contains adipose tissue. 2. Mildly hyperdense exophytic left renal lesions are probably mildly complex cysts but are technically nonspecific. Consider pelvic sonography scratch I consider renal sonography for further characterization since we cannot give IV contrast. Renal protocol MRI with contrast would be an alternative for definitive characterization. 3. Lumbar spondylosis and degenerative disc disease. 4. Sigmoid diverticulosis without active diverticulitis.   Electronically Signed   By: Herbie Baltimore M.D.   On: 10/21/2012 16:14    EKG Interpretation   None       MDM   1. Left inguinal hernia   2. Right inguinal hernia   3. Renal cyst   4. Diverticulosis    Pt with recurrent left inguinal hernia.  No clinical concern for incarceration or strangulation.  Pt sent by [facility] Surgery office with patient expecting STAT CT.  I discussed with patient that this is not necessary emergently.  However, pt is having pain and states Dr Magnus Ivan "wanted the scan," therefore scan was ordered.  CT results as above.  Discussed all results, findings, treatment, and follow up  with patient.  Pt given return precautions.  Pt verbalizes understanding and agrees with plan.        Trixie Dredge, PA-C 10/21/12 1701

## 2012-10-21 NOTE — ED Notes (Signed)
Blinda Leatherwood, MD, and Irving Burton, [place], made aware of patient's confusion about whether he has allergies to contrast media. IV contrast cancelled, patient will continue to have oral contrast as radiology sees fit.

## 2012-10-21 NOTE — Telephone Encounter (Signed)
Patient called in today with c/o bulge at hernia repair site.  Patient states severe pain at the site.  Patient had left hernia repair on 03/26/12.  Paged Dr. Magnus Ivan at this time to try to get is opinion on what the best plan for the patient is.

## 2012-10-21 NOTE — Telephone Encounter (Signed)
Spoke to Dr. Magnus Ivan in the OR who states that patient needs to go to the ED for evaluation.  Called patient back to advise him to go to either [facility] or [facility] ED for hopefully a CT scan to determine what this "bulge" is.  Patient states understanding and agreeable with plan at this time.

## 2012-10-22 ENCOUNTER — Telehealth (INDEPENDENT_AMBULATORY_CARE_PROVIDER_SITE_OTHER): Payer: Self-pay | Admitting: *Deleted

## 2012-10-22 NOTE — Telephone Encounter (Signed)
Needs to see me in the office next week

## 2012-10-22 NOTE — Telephone Encounter (Signed)
Patient called to find out what he needs to do now.  Patient did go to the ED yesterday and had a CT scan.  Explained I would send a message to Dr. Magnus Ivan to see if he can review the CT scan and ED notes then let me know the appropriate plan moving forward.  Explained I will give him a call back as soon as I know.  Patient states understanding and agreeable at this time.

## 2012-11-02 ENCOUNTER — Ambulatory Visit (INDEPENDENT_AMBULATORY_CARE_PROVIDER_SITE_OTHER): Payer: BC Managed Care – PPO | Admitting: Surgery

## 2012-11-02 ENCOUNTER — Encounter (INDEPENDENT_AMBULATORY_CARE_PROVIDER_SITE_OTHER): Payer: Self-pay | Admitting: Surgery

## 2012-11-02 ENCOUNTER — Encounter (INDEPENDENT_AMBULATORY_CARE_PROVIDER_SITE_OTHER): Payer: Self-pay

## 2012-11-02 VITALS — BP 118/62 | HR 88 | Resp 16 | Ht 64.0 in | Wt 134.4 lb

## 2012-11-02 DIAGNOSIS — K4091 Unilateral inguinal hernia, without obstruction or gangrene, recurrent: Secondary | ICD-10-CM

## 2012-11-02 DIAGNOSIS — K4090 Unilateral inguinal hernia, without obstruction or gangrene, not specified as recurrent: Secondary | ICD-10-CM

## 2012-11-02 NOTE — Progress Notes (Signed)
Subjective:     Patient ID: Christian Roberts, male   DOB: 19-Feb-1945, 67 y.o.   MRN: [id number]  HPI This is a gentleman who I operated on in March of 2013 for a large left inguinal hernia. He recently noticed a recurrence several months ago. He was asymptomatic initially but now he has mild discomfort. He has had a CAT scan which also shows he has a right inguinal hernia. He has actually recently fallen and tore his rotator cuff and biceps and has had surgery on the left side for this.  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 I discussed this with him in detail. I discussed the risks of surgery which includes but is not limited to bleeding, infection, nerve entrapment, chronic pain, recurrence, etc. He understands and wishes to proceed. Surgery will be scheduled

## 2012-11-04 NOTE — ED Provider Notes (Addendum)
I saw and evaluated the patient, reviewed the resident's note and I agree with the findings and plan.  EKG Interpretation   None         Gilda Crease, MD 11/04/12 0500  Gilda Crease, MD 11/06/12 [phone]

## 2012-11-25 ENCOUNTER — Encounter (HOSPITAL_COMMUNITY): Payer: Self-pay | Admitting: Pharmacy Technician

## 2012-11-27 ENCOUNTER — Encounter (HOSPITAL_COMMUNITY): Payer: Self-pay

## 2012-11-27 ENCOUNTER — Encounter (HOSPITAL_COMMUNITY)
Admission: RE | Admit: 2012-11-27 | Discharge: 2012-11-27 | Disposition: A | Discharge status: Home / Self Care | Payer: BC Managed Care – PPO | Source: Ambulatory Visit | Attending: Surgery | Admitting: Surgery

## 2012-11-27 DIAGNOSIS — Z01810 Encounter for preprocedural cardiovascular examination: Secondary | ICD-10-CM | POA: Insufficient documentation

## 2012-11-27 DIAGNOSIS — Z01818 Encounter for other preprocedural examination: Secondary | ICD-10-CM | POA: Insufficient documentation

## 2012-11-27 DIAGNOSIS — Z01812 Encounter for preprocedural laboratory examination: Secondary | ICD-10-CM | POA: Insufficient documentation

## 2012-11-27 HISTORY — DX: Nocturia: R35.1

## 2012-11-27 HISTORY — DX: Anesthesia of skin: R20.0

## 2012-11-27 HISTORY — DX: Constipation, unspecified: K59.00

## 2012-11-27 HISTORY — DX: Adverse effect of unspecified anesthetic, initial encounter: T41.45XA

## 2012-11-27 HISTORY — DX: Other complications of anesthesia, initial encounter: T88.59XA

## 2012-11-27 LAB — BASIC METABOLIC PANEL
Calcium: 10.5 mg/dL (ref 8.4–10.5)
Creatinine, Ser: 0.96 mg/dL (ref 0.50–1.35)
GFR calc Af Amer: >90 mL/min (ref >90)
Sodium: 139 mEq/L (ref 135–145)

## 2012-11-27 LAB — CBC
MCH: 28.8 pg (ref 26.0–34.0)
MCV: 88.4 fL (ref 78.0–100.0)
Platelets: 344 10*3/uL (ref 150–400)
RDW: 14.8 % (ref 11.5–15.5)
WBC: 8.3 10*3/uL (ref 4.0–10.5)

## 2012-11-27 NOTE — Progress Notes (Signed)
Abnormal BMET faxed to Dr.D Magnus Ivan

## 2012-11-27 NOTE — Progress Notes (Signed)
11/27/12 1120  OBSTRUCTIVE SLEEP APNEA  Have you ever been diagnosed with sleep apnea through a sleep study? No  Do you snore loudly (loud enough to be heard through closed doors)?  1  Do you often feel tired, fatigued, or sleepy during the daytime? 1  Has anyone observed you stop breathing during your sleep? 1  Do you have, or are you being treated for high blood pressure? 0  BMI more than 35 kg/m2? 0  Age over 66 years old? 1  Neck circumference greater than 40 cm/18 inches? 0  Gender: 1  Obstructive Sleep Apnea Score 5  Score 4 or greater  Results sent to PCP

## 2012-11-27 NOTE — Patient Instructions (Addendum)
Christian Roberts  11/27/2012                           YOUR PROCEDURE IS SCHEDULED ON: 12/09/12               PLEASE REPORT TO SHORT STAY CENTER AT : 6:00 am               CALL THIS NUMBER IF ANY PROBLEMS THE DAY OF SURGERY :               [phone]                      REMEMBER:   Do not eat food or drink liquids AFTER MIDNIGHT   Take these medicines the morning of surgery with A SIP OF WATER: gabapentin / vesicare / nexium   Do not wear jewelry, make-up   Do not wear lotions, powders, or perfumes.   Do not shave legs or underarms 12 hrs. before surgery (men may shave face)  Do not bring valuables to the hospital.  Contacts, dentures or bridgework may not be worn into surgery.  Leave suitcase in the car. After surgery it may be brought to your room.  For patients admitted to the hospital more than one night, checkout time is 11:00                          The day of discharge.   Patients discharged the day of surgery will not be allowed to drive home                             If going home same day of surgery, must have someone stay with you first                           24 hrs at home and arrange for some one to drive you home from hospital.    Special Instructions:   Please read over the following fact sheets that you were given:               1. Stop aspirin and herbal meds 5 days preop                      2. [facility] PREPARING FOR SURGERY SHEET                                                X_____________________________________________________________________        Failure to follow these instructions may result in cancellation of your surgery

## 2012-11-30 ENCOUNTER — Inpatient Hospital Stay (HOSPITAL_COMMUNITY): Admission: RE | Admit: 2012-11-30 | Payer: BC Managed Care – PPO | Source: Ambulatory Visit

## 2012-12-08 NOTE — H&P (Signed)
Christian Roberts is an 67 y.o. male.   Chief Complaint: Bilateral inguinal hernias HPI:  This is a pleasant gentleman who has had a recurrent left inguinal hernia as well as a new right inguinal hernia which is symptomatic. He presents today for elective laparoscopic repair. He currently has no complaints  Past Medical History  Diagnosis Date  . Allergic rhinitis   . GERD (gastroesophageal reflux disease)   . Hyperlipidemia   . Left ankle pain     chronic  . Bronchitis   . Hernia   . Diabetes mellitus     diet controlled - since 2012  . Headache(784.0)     headaches-related to sinuses & uses Tylenol   . Skin cancer, basal cell   . Osteoarthritis     ankle- left, hands & neck & hip  . Neck pain   . Complication of anesthesia   . PONV (postoperative nausea and vomiting)     WITH ETHER  . Asthma     "I outgrew it"  . Numbness of fingers   . Constipation   . Nocturia     Past Surgical History  Procedure Laterality Date  . Ankle surgery Left   . Esophagogastroduodenoscopy  06/27/2006  . Tonsillectomy    . Varicocelectomy    . Left ankle mass removed  1972    3 surgeries on L ankle- 73, 2008, 2009  . Neck surgery  03/11,     C3-C7  . Back surgery      cerv. fusion C1- T1, 2 separate surgeries , T1-T3 2014  . Appendectomy      1964  . Cystoscopy      multiple times for infections  . Inguinal hernia repair  03/27/2011    Procedure: HERNIA REPAIR INGUINAL ADULT;  Surgeon: Shelly Rubenstein, MD;  Location: [facility] OR;  Service: General;  Laterality: Left;  . Carpal tunnel release Right   . Rectal surgery      "BORN WITHOUT A RECTUM" SURGICAL CORRECTION  . Shoulder surgery  09/17/12    Family History  Problem Relation Age of Onset  . Stroke Mother   . Anesthesia problems Mother   . Cerebral aneurysm Father   . Cancer Father     skin  . Diabetes Sister     hypoglycemia  . Breast cancer Sister     x 2  . Cancer Sister     breast  . Lung cancer Sister   . Cancer Sister      breast  . Emphysema Sister   . Cancer Sister     lung   Social History:  reports that he quit smoking about 7 months ago. His smoking use included Cigarettes. He smoked 0.00 packs per day. He has never used smokeless tobacco. He reports that he does not drink alcohol or use illicit drugs.  Allergies:  Allergies  Allergen Reactions  . Aspirin Anaphylaxis  . Codeine Phosphate Anaphylaxis  . Ibuprofen Anaphylaxis  . Morphine Sulfate Anaphylaxis  . Naproxen Sodium Anaphylaxis  . Nsaids Anaphylaxis  . Sulfamethoxazole Anaphylaxis  . Levofloxacin Itching    No prescriptions prior to admission    No results found for this or any previous visit (from the past 48 hour(s)). No results found.  Review of Systems  All other systems reviewed and are negative.    There were no vitals taken for this visit. Physical Exam  Constitutional: He is oriented to person, place, and time. He appears well-developed and well-nourished. No  distress.  HENT:  Head: Normocephalic and atraumatic.  Eyes: Conjunctivae are normal. Pupils are equal, round, and reactive to light.  Neck: Normal range of motion. Neck supple.  Cardiovascular: Normal rate, regular rhythm and normal heart sounds.   Respiratory: Effort normal and breath sounds normal.  GI: Soft. Bowel sounds are normal.  Bilateral reducible inguinal hernias  Musculoskeletal: Normal range of motion.  Neurological: He is alert and oriented to person, place, and time.  Skin: Skin is warm and dry.  Psychiatric: His behavior is normal.     Assessment/Plan Bilateral inguinal hernias with the left being recurrent  Laparoscopic repair of the bilateral inguinal hernias with mesh was recommended. I discussed this with him in detail. I discussed the surgical procedure. I discussed the risks which includes but is not limited to bleeding, infection, nerve entrapment, chronic pain, recurrence, need for further surgery, et Karie Soda. I also discussed  postoperative recovery. He understands and wishes to proceed.  Ahlam Piscitelli A 12/08/2012, 8:48 AM

## 2012-12-08 NOTE — Anesthesia Preprocedure Evaluation (Addendum)
Anesthesia Evaluation  Patient identified by MRN, date of birth, ID band Patient awake    Reviewed: Allergy & Precautions, H&P , NPO status , Patient's Chart, lab work & pertinent test results  History of Anesthesia Complications (+) PONV  Airway Mallampati: II TM Distance: >3 FB Neck ROM: full    Dental  (+) Edentulous Upper and Edentulous Lower   Pulmonary neg pulmonary ROS, former smoker,  breath sounds clear to auscultation  Pulmonary exam normal       Cardiovascular Exercise Tolerance: Good negative cardio ROS  Rhythm:regular Rate:Normal     Neuro/Psych negative neurological ROS  negative psych ROS   GI/Hepatic negative GI ROS, Neg liver ROS, GERD-  Medicated and Controlled,  Endo/Other  negative endocrine ROSdiabetes, Well Controlled, Type 2, Oral Hypoglycemic Agents  Renal/GU negative Renal ROS  negative genitourinary   Musculoskeletal   Abdominal   Peds  Hematology negative hematology ROS (+)   Anesthesia Other Findings Anaphylaxis with morphine  Reproductive/Obstetrics negative OB ROS                         Anesthesia Physical Anesthesia Plan  ASA: II  Anesthesia Plan: General   Post-op Pain Management:    Induction: Intravenous  Airway Management Planned: Oral ETT  Additional Equipment:   Intra-op Plan:   Post-operative Plan: Extubation in OR  Informed Consent: I have reviewed the patients History and Physical, chart, labs and discussed the procedure including the risks, benefits and alternatives for the proposed anesthesia with the patient or authorized representative who has indicated his/her understanding and acceptance.   Dental Advisory Given  Plan Discussed with: CRNA and Surgeon  Anesthesia Plan Comments:         Anesthesia Quick Evaluation

## 2012-12-09 ENCOUNTER — Encounter (HOSPITAL_COMMUNITY)
Admission: RE | Disposition: A | Discharge status: Home / Self Care | Payer: Self-pay | Source: Ambulatory Visit | Attending: Surgery

## 2012-12-09 ENCOUNTER — Encounter (HOSPITAL_COMMUNITY): Payer: BC Managed Care – PPO | Admitting: Anesthesiology

## 2012-12-09 ENCOUNTER — Ambulatory Visit (HOSPITAL_COMMUNITY)
Admission: RE | Admit: 2012-12-09 | Discharge: 2012-12-09 | Disposition: A | Discharge status: Home / Self Care | Payer: BC Managed Care – PPO | Source: Ambulatory Visit | Attending: Surgery | Admitting: Surgery

## 2012-12-09 ENCOUNTER — Ambulatory Visit (HOSPITAL_COMMUNITY): Payer: BC Managed Care – PPO | Admitting: Anesthesiology

## 2012-12-09 ENCOUNTER — Encounter (HOSPITAL_COMMUNITY): Payer: Self-pay | Admitting: *Deleted

## 2012-12-09 DIAGNOSIS — E785 Hyperlipidemia, unspecified: Secondary | ICD-10-CM | POA: Insufficient documentation

## 2012-12-09 DIAGNOSIS — K4020 Bilateral inguinal hernia, without obstruction or gangrene, not specified as recurrent: Secondary | ICD-10-CM | POA: Insufficient documentation

## 2012-12-09 DIAGNOSIS — K4090 Unilateral inguinal hernia, without obstruction or gangrene, not specified as recurrent: Secondary | ICD-10-CM

## 2012-12-09 DIAGNOSIS — Z5331 Laparoscopic surgical procedure converted to open procedure: Secondary | ICD-10-CM | POA: Insufficient documentation

## 2012-12-09 DIAGNOSIS — K4091 Unilateral inguinal hernia, without obstruction or gangrene, recurrent: Secondary | ICD-10-CM

## 2012-12-09 DIAGNOSIS — Z9089 Acquired absence of other organs: Secondary | ICD-10-CM | POA: Insufficient documentation

## 2012-12-09 DIAGNOSIS — E119 Type 2 diabetes mellitus without complications: Secondary | ICD-10-CM | POA: Insufficient documentation

## 2012-12-09 DIAGNOSIS — K219 Gastro-esophageal reflux disease without esophagitis: Secondary | ICD-10-CM | POA: Insufficient documentation

## 2012-12-09 DIAGNOSIS — Z87891 Personal history of nicotine dependence: Secondary | ICD-10-CM | POA: Insufficient documentation

## 2012-12-09 HISTORY — PX: INGUINAL HERNIA REPAIR: SHX194

## 2012-12-09 HISTORY — PX: INSERTION OF MESH: SHX5868

## 2012-12-09 LAB — GLUCOSE, CAPILLARY

## 2012-12-09 SURGERY — REPAIR, HERNIA, INGUINAL, BILATERAL, LAPAROSCOPIC
Anesthesia: General | Site: Abdomen | Laterality: Bilateral

## 2012-12-09 MED ORDER — GLYCOPYRROLATE 0.2 MG/ML IJ SOLN
INTRAMUSCULAR | Status: DC | PRN
  Administered 2012-12-09: 0.6 mg via INTRAVENOUS

## 2012-12-09 MED ORDER — OXYCODONE HCL 5 MG PO TABS: 5.0000 mg | ORAL_TABLET | ORAL | Status: DC | PRN

## 2012-12-09 MED ORDER — FENTANYL CITRATE 0.05 MG/ML IJ SOLN
INTRAMUSCULAR | Status: AC
  Filled 2012-12-09: qty 5

## 2012-12-09 MED ORDER — ONDANSETRON HCL 4 MG/2ML IJ SOLN
INTRAMUSCULAR | Status: DC | PRN
  Administered 2012-12-09: 4 mg via INTRAVENOUS

## 2012-12-09 MED ORDER — 0.9 % SODIUM CHLORIDE (POUR BTL) OPTIME
TOPICAL | Status: DC | PRN
  Administered 2012-12-09: 1000 mL

## 2012-12-09 MED ORDER — SUCCINYLCHOLINE CHLORIDE 20 MG/ML IJ SOLN
INTRAMUSCULAR | Status: AC
  Filled 2012-12-09: qty 1

## 2012-12-09 MED ORDER — MIDAZOLAM HCL 2 MG/2ML IJ SOLN
INTRAMUSCULAR | Status: AC
  Filled 2012-12-09: qty 2

## 2012-12-09 MED ORDER — CEFAZOLIN SODIUM-DEXTROSE 2-3 GM-% IV SOLR
2.0000 g | INTRAVENOUS | Status: AC
  Administered 2012-12-09: 2 g via INTRAVENOUS

## 2012-12-09 MED ORDER — HYDROMORPHONE HCL PF 2 MG/ML IJ SOLN
INTRAMUSCULAR | Status: AC
  Filled 2012-12-09: qty 1

## 2012-12-09 MED ORDER — SODIUM CHLORIDE 0.9 % IJ SOLN: 3.0000 mL | Freq: Two times a day (BID) | INTRAMUSCULAR | Status: DC

## 2012-12-09 MED ORDER — GLYCOPYRROLATE 0.2 MG/ML IJ SOLN
INTRAMUSCULAR | Status: AC
  Filled 2012-12-09: qty 3

## 2012-12-09 MED ORDER — MIDAZOLAM HCL 5 MG/5ML IJ SOLN
INTRAMUSCULAR | Status: DC | PRN
  Administered 2012-12-09: 2 mg via INTRAVENOUS

## 2012-12-09 MED ORDER — HYDROMORPHONE HCL PF 1 MG/ML IJ SOLN
INTRAMUSCULAR | Status: DC | PRN
  Administered 2012-12-09 (×2): 0.5 mg via INTRAVENOUS
  Administered 2012-12-09: 1 mg via INTRAVENOUS

## 2012-12-09 MED ORDER — FENTANYL CITRATE 0.05 MG/ML IJ SOLN: 25.0000 ug | INTRAMUSCULAR | Status: DC | PRN

## 2012-12-09 MED ORDER — LACTATED RINGERS IV SOLN: INTRAVENOUS | Status: DC

## 2012-12-09 MED ORDER — BUPIVACAINE HCL (PF) 0.5 % IJ SOLN
INTRAMUSCULAR | Status: AC
  Filled 2012-12-09: qty 30

## 2012-12-09 MED ORDER — ROCURONIUM BROMIDE 100 MG/10ML IV SOLN
INTRAVENOUS | Status: AC
  Filled 2012-12-09: qty 1

## 2012-12-09 MED ORDER — ACETAMINOPHEN 650 MG RE SUPP
650.0000 mg | RECTAL | Status: DC | PRN
  Filled 2012-12-09: qty 1

## 2012-12-09 MED ORDER — BUPIVACAINE HCL (PF) 0.5 % IJ SOLN
INTRAMUSCULAR | Status: DC | PRN
  Administered 2012-12-09: 30 mL

## 2012-12-09 MED ORDER — SODIUM CHLORIDE 0.9 % IJ SOLN: 3.0000 mL | INTRAMUSCULAR | Status: DC | PRN

## 2012-12-09 MED ORDER — CEFAZOLIN SODIUM-DEXTROSE 2-3 GM-% IV SOLR
INTRAVENOUS | Status: AC
  Filled 2012-12-09: qty 50

## 2012-12-09 MED ORDER — SUCCINYLCHOLINE CHLORIDE 20 MG/ML IJ SOLN
INTRAMUSCULAR | Status: DC | PRN
  Administered 2012-12-09: 100 mg via INTRAVENOUS

## 2012-12-09 MED ORDER — ROCURONIUM BROMIDE 100 MG/10ML IV SOLN
INTRAVENOUS | Status: DC | PRN
  Administered 2012-12-09 (×2): 10 mg via INTRAVENOUS
  Administered 2012-12-09: 20 mg via INTRAVENOUS

## 2012-12-09 MED ORDER — ONDANSETRON HCL 4 MG/2ML IJ SOLN
INTRAMUSCULAR | Status: AC
  Filled 2012-12-09: qty 2

## 2012-12-09 MED ORDER — ACETAMINOPHEN 325 MG PO TABS: 650.0000 mg | ORAL_TABLET | ORAL | Status: DC | PRN

## 2012-12-09 MED ORDER — PROPOFOL 10 MG/ML IV BOLUS
INTRAVENOUS | Status: DC | PRN
  Administered 2012-12-09: 160 mg via INTRAVENOUS

## 2012-12-09 MED ORDER — LIDOCAINE HCL (CARDIAC) 20 MG/ML IV SOLN
INTRAVENOUS | Status: AC
  Filled 2012-12-09: qty 5

## 2012-12-09 MED ORDER — SODIUM CHLORIDE 0.9 % IV SOLN: 250.0000 mL | INTRAVENOUS | Status: DC | PRN

## 2012-12-09 MED ORDER — PROPOFOL 10 MG/ML IV BOLUS
INTRAVENOUS | Status: AC
  Filled 2012-12-09: qty 20

## 2012-12-09 MED ORDER — LIDOCAINE HCL (CARDIAC) 20 MG/ML IV SOLN
INTRAVENOUS | Status: DC | PRN
  Administered 2012-12-09: 50 mg via INTRAVENOUS

## 2012-12-09 MED ORDER — ONDANSETRON HCL 4 MG/2ML IJ SOLN: 4.0000 mg | Freq: Four times a day (QID) | INTRAMUSCULAR | Status: DC | PRN

## 2012-12-09 MED ORDER — NEOSTIGMINE METHYLSULFATE 1 MG/ML IJ SOLN
INTRAMUSCULAR | Status: DC | PRN
  Administered 2012-12-09: 4 mg via INTRAVENOUS

## 2012-12-09 MED ORDER — LACTATED RINGERS IV SOLN
INTRAVENOUS | Status: DC | PRN
  Administered 2012-12-09 (×2): via INTRAVENOUS

## 2012-12-09 MED ORDER — NEOSTIGMINE METHYLSULFATE 1 MG/ML IJ SOLN
INTRAMUSCULAR | Status: AC
  Filled 2012-12-09: qty 10

## 2012-12-09 MED ORDER — FENTANYL CITRATE 0.05 MG/ML IJ SOLN
INTRAMUSCULAR | Status: DC | PRN
  Administered 2012-12-09 (×2): 100 ug via INTRAVENOUS
  Administered 2012-12-09: 50 ug via INTRAVENOUS

## 2012-12-09 SURGICAL SUPPLY — 42 items
BANDAGE ADHESIVE 1X3 (GAUZE/BANDAGES/DRESSINGS) ×2 IMPLANT
BENZOIN TINCTURE PRP APPL 2/3 (GAUZE/BANDAGES/DRESSINGS) ×2 IMPLANT
CLOSURE STERI-STRIP 1/4X4 (GAUZE/BANDAGES/DRESSINGS) ×2 IMPLANT
DECANTER SPIKE VIAL GLASS SM (MISCELLANEOUS) IMPLANT
DEVICE SECURE STRAP 25 ABSORB (INSTRUMENTS) ×2 IMPLANT
DISSECT BALLN SPACEMKR + OVL (BALLOONS) ×2
DISSECTOR BALLN SPACEMKR + OVL (BALLOONS) ×1 IMPLANT
DISSECTOR BLUNT TIP ENDO 5MM (MISCELLANEOUS) IMPLANT
DRAIN PENROSE 18X1/2 LTX STRL (DRAIN) ×2 IMPLANT
DRAIN PENROSE 18X1/4 LTX STRL (WOUND CARE) ×2 IMPLANT
DRAPE LAPAROSCOPIC ABDOMINAL (DRAPES) ×2 IMPLANT
DRSG TEGADERM 4X4.75 (GAUZE/BANDAGES/DRESSINGS) ×2 IMPLANT
ELECT REM PT RETURN 9FT ADLT (ELECTROSURGICAL) ×2
ELECTRODE REM PT RTRN 9FT ADLT (ELECTROSURGICAL) ×1 IMPLANT
GAUZE SPONGE 2X2 8PLY STRL LF (GAUZE/BANDAGES/DRESSINGS) ×1 IMPLANT
GLOVE BIOGEL PI IND STRL 7.0 (GLOVE) ×1 IMPLANT
GLOVE BIOGEL PI INDICATOR 7.0 (GLOVE) ×1
GOWN STRL REIN XL XLG (GOWN DISPOSABLE) ×6 IMPLANT
KIT BASIN OR (CUSTOM PROCEDURE TRAY) ×2 IMPLANT
MESH PARIETEX PROGRIP LEFT (Mesh General) ×2 IMPLANT
MESH PARIETEX PROGRIP RIGHT (Mesh General) ×2 IMPLANT
NEEDLE INSUFFLATION 14GA 120MM (NEEDLE) ×2 IMPLANT
SCISSORS LAP 5X35 DISP (ENDOMECHANICALS) ×2 IMPLANT
SET IRRIG TUBING LAPAROSCOPIC (IRRIGATION / IRRIGATOR) IMPLANT
SLEEVE ADV FIXATION 5X100MM (TROCAR) ×2 IMPLANT
SOLUTION ANTI FOG 6CC (MISCELLANEOUS) ×2 IMPLANT
SPONGE GAUZE 2X2 STER 10/PKG (GAUZE/BANDAGES/DRESSINGS) ×1
SPONGE GAUZE 4X4 12PLY (GAUZE/BANDAGES/DRESSINGS) ×2 IMPLANT
STRIP CLOSURE SKIN 1/2X4 (GAUZE/BANDAGES/DRESSINGS) ×2 IMPLANT
SUT MNCRL AB 4-0 PS2 18 (SUTURE) ×4 IMPLANT
SUT VIC AB 2-0 CT1 27 (SUTURE) ×1
SUT VIC AB 2-0 CT1 TAPERPNT 27 (SUTURE) ×1 IMPLANT
SUT VIC AB 2-0 SH 27 (SUTURE) ×4
SUT VIC AB 2-0 SH 27X BRD (SUTURE) ×2 IMPLANT
SUT VIC AB 3-0 SH 27 (SUTURE) ×2
SUT VIC AB 3-0 SH 27XBRD (SUTURE) ×2 IMPLANT
TOWEL OR 17X26 10 PK STRL BLUE (TOWEL DISPOSABLE) ×2 IMPLANT
TRAY FOLEY CATH 14FRSI W/METER (CATHETERS) ×2 IMPLANT
TRAY LAP CHOLE (CUSTOM PROCEDURE TRAY) ×2 IMPLANT
TROCAR ADV FIXATION 5X100MM (TROCAR) ×2 IMPLANT
TROCAR CANNULA W/PORT DUAL 5MM (MISCELLANEOUS) ×2 IMPLANT
TUBING INSUFFLATION 10FT LAP (TUBING) ×2 IMPLANT

## 2012-12-09 NOTE — Transfer of Care (Signed)
Immediate Anesthesia Transfer of Care Note  Patient: Christian Roberts  Procedure(s) Performed: Procedure(s): LAPAROSCOPIC BILATERAL INGUINAL HERNIA REPAIR WITH MESH (Bilateral) INSERTION OF MESH (Bilateral)  Patient Location: PACU  Anesthesia Type:General  Level of Consciousness: awake, alert  and oriented  Airway & Oxygen Therapy: Patient Spontanous Breathing and Patient connected to face mask oxygen  Post-op Assessment: Report given to PACU RN and Post -op Vital signs reviewed and stable  Post vital signs: Reviewed and stable  Complications: No apparent anesthesia complications

## 2012-12-09 NOTE — Anesthesia Postprocedure Evaluation (Signed)
  Anesthesia Post-op Note  Patient: Christian Roberts  Procedure(s) Performed: Procedure(s) (LRB): LAPAROSCOPIC BILATERAL INGUINAL HERNIA REPAIR WITH MESH (Bilateral) INSERTION OF MESH (Bilateral)  Patient Location: PACU  Anesthesia Type: General  Level of Consciousness: awake and alert   Airway and Oxygen Therapy: Patient Spontanous Breathing  Post-op Pain: mild  Post-op Assessment: Post-op Vital signs reviewed, Patient's Cardiovascular Status Stable, Respiratory Function Stable, Patent Airway and No signs of Nausea or vomiting  Last Vitals:  Filed Vitals:   12/09/12 1121  BP: 134/75  Pulse: 84  Temp:   Resp: 12    Post-op Vital Signs: stable   Complications: No apparent anesthesia complications

## 2012-12-09 NOTE — Interval H&P Note (Signed)
History and Physical Interval Note: no change in H and P  12/09/2012 7:33 AM  Laurie Panda Bodi  has presented today for surgery, with the diagnosis of Bilateral Inguinal hernia  The various methods of treatment have been discussed with the patient and family. After consideration of risks, benefits and other options for treatment, the patient has consented to  Procedure(s): LAPAROSCOPIC BILATERAL INGUINAL HERNIA REPAIR WITH MESH (N/A) INSERTION OF MESH (N/A) as a surgical intervention .  The patient's history has been reviewed, patient examined, no change in status, stable for surgery.  I have reviewed the patient's chart and labs.  Questions were answered to the patient's satisfaction.     Precious Segall A

## 2012-12-09 NOTE — Op Note (Signed)
LAPAROSCOPIC BILATERAL INGUINAL HERNIA REPAIR WITH MESH, INSERTION OF MESH  Procedure Note  Christian Roberts 12/09/2012   Pre-op Diagnosis: Bilateral Inguinal hernia     Post-op Diagnosis: same  Procedure(s): BILATERAL LAPAROSCOPIC CONVERTED TO OPEN INGUINAL HERNIA REPAIR WITH MESH  Surgeon(s): Shelly Rubenstein, MD  Anesthesia: General  Staff:  Circulator: Jaynee Eagles, RN Scrub Person: Clarnce Flock, CST; Therese Sarah, RN  Estimated Blood Loss: Minimal                         Christian Roberts   Date: 12/09/2012  Time: 10:02 AM

## 2012-12-10 ENCOUNTER — Encounter (HOSPITAL_COMMUNITY): Payer: Self-pay | Admitting: Surgery

## 2012-12-10 ENCOUNTER — Telehealth (INDEPENDENT_AMBULATORY_CARE_PROVIDER_SITE_OTHER): Payer: Self-pay

## 2012-12-10 NOTE — Telephone Encounter (Signed)
Pt's wife calling again to ask how often to apply ice pack.  I told her it was up to the patient's discretion to apply and remove as needed.

## 2012-12-10 NOTE — Op Note (Signed)
NAMEDONNE, BALEY NO.:  [id number]  MEDICAL RECORD NO.:  [id number]  LOCATION:  [facility]                         FACILITY:  [facility]  PHYSICIAN:  Abigail Miyamoto, M.D. DATE OF BIRTH:  [birth year]  DATE OF PROCEDURE:  12/09/2012 DATE OF DISCHARGE:  12/09/2012                              OPERATIVE REPORT   PREOPERATIVE DIAGNOSIS:  Bilateral inguinal hernias (left recurrent).  POSTOPERATIVE DIAGNOSIS:  Bilateral inguinal hernias (left recurrent).  PROCEDURE:  Bilateral laparoscopic converted to open inguinal hernia repair with mesh.  SURGEON:  Abigail Miyamoto, MD  ANESTHESIA:  General and 0.25% Marcaine.  ESTIMATED BLOOD LOSS:  Minimal.  PROCEDURE IN DETAIL:  The patient was brought to the operating room, identified as Christian Roberts.  He was placed supine on the operating room table and general anesthesia was induced.  A Foley catheter was then inserted.  His abdomen was then prepped and draped in usual sterile fashion.  I made a small incision just below the umbilicus with a scalpel.  I took this down to the fascia which was opened just to the right of the midline.  The rectus muscle then identified and elevated. The dissecting balloon was then passed underneath the rectus sheath and manipulated towards the pubis.  Insufflation was then begun under direct vision.  The balloon had difficult time dissecting out the preperitoneal space.  I then removed the balloon and insufflated the area with carbon dioxide.  I placed two 5-mm ports in midline under direct vision.  The peritoneum had torn in multiple locations.  It was completely fixated on the right side of the abdomen from his previous appendectomy incision. This may be difficult to reduce any of the hernias.  I inserted a Veress needle to decompress the abdomen.  This was unsuccessful in maintaining our preperitoneal space.  Because of this, decision was made to terminate the laparoscopic part and convert  to an open procedure.  I made a longitudinal incision in the patient's right groin removing all laparoscopic equipment.  I took this down through Scarpa fascia with electrocautery.  The external oblique fascia was then identified and opened through the internal and external rings.  I then controlled the testicular cord structures with a Penrose drain.  The patient had a direct inguinal hernia.  The sac was easily reduced.  There was no evidence of indirect hernia.  I brought a piece of ProGrip mesh onto the field.  I then placed it as an onlay on the inguinal floor and around the cord structures.  I then sewed it to the pubic tubercle, with interrupted 2-0 Vicryl sutures.  Good coverage of the inguinal floor appeared to be achieved.  I then closed the external oblique over the top of this with a running 2-0 Vicryl sutures.  The Scarpa fascia was then closed with interrupted 3-0 Vicryl sutures.  Next, I made a longitudinal incision in the patient's left groin through the previous scar.  I took this down through the Scarpa fascia with electrocautery. The external oblique fascia was identified and opened through the internal and external rings.  The patient appeared to have recurrence of the direct hernia underneath the edge of the previously  placed mesh.  I was able to separate the hernia sac from the surrounding tissue and reduce it back into the abdominal cavity.  There was no evidence of indirect hernia.  I then brought a new piece of ProGrip mesh onto the field after I imbricated the inguinal floor over the top of the sac with 2-0 Vicryl sutures.  I then placed this as an onlay on the inguinal floor and sewed it to pubic tubercle with 2-0 Vicryl sutures.  The mesh was then brought around the cord structures and covered the inguinal floor well.  Again, both pieces were Prolene, ProGrip mesh.  I then closed the fascia over the top of the mesh and cord structures with a running 2-0 Vicryl  suture.  I anesthetized the fascia and all incisions with Marcaine.  I then closed all incisions with running and interrupted 4-0 Monocryl sutures.  Steri-Strips and Band-Aids were then applied. The patient tolerated the procedure well.  All counts were correct at the end of the procedure.  The patient was then extubated in the operating room and taken in stable condition to recovery room.     Abigail Miyamoto, M.D.     DB/MEDQ  D:  12/09/2012  T:  12/10/2012  Job:  [id number]

## 2012-12-10 NOTE — Telephone Encounter (Signed)
Pt is s/p bilateral hernia repair 12/09/12 by Dr. Magnus Ivan. Pt's wife calling for instructions on bathing and removing the bandage.  I told her it was fine for him to stand in front of the mirror and wash with a washcloth if he is too uncomfortable to stand in the shower or get into the bathtub.  I explained that he would be sore and uncomfortable for several days, possibly week(s).

## 2012-12-21 ENCOUNTER — Other Ambulatory Visit: Payer: Self-pay | Admitting: Orthopedic Surgery

## 2012-12-21 DIAGNOSIS — M545 Low back pain, unspecified: Secondary | ICD-10-CM

## 2012-12-23 ENCOUNTER — Ambulatory Visit
Admission: RE | Admit: 2012-12-23 | Discharge: 2012-12-23 | Disposition: A | Discharge status: Home / Self Care | Payer: BC Managed Care – PPO | Source: Ambulatory Visit | Attending: Orthopedic Surgery | Admitting: Orthopedic Surgery

## 2012-12-23 DIAGNOSIS — M545 Low back pain, unspecified: Secondary | ICD-10-CM

## 2012-12-23 MED ORDER — GADOBENATE DIMEGLUMINE 529 MG/ML IV SOLN
12.0000 mL | Freq: Once | INTRAVENOUS | Status: AC | PRN
  Administered 2012-12-23: 12 mL via INTRAVENOUS

## 2012-12-24 ENCOUNTER — Encounter (INDEPENDENT_AMBULATORY_CARE_PROVIDER_SITE_OTHER): Payer: Self-pay | Admitting: Surgery

## 2012-12-24 ENCOUNTER — Other Ambulatory Visit: Payer: BC Managed Care – PPO

## 2012-12-24 ENCOUNTER — Ambulatory Visit (INDEPENDENT_AMBULATORY_CARE_PROVIDER_SITE_OTHER): Payer: BC Managed Care – PPO | Admitting: Surgery

## 2012-12-24 ENCOUNTER — Encounter (INDEPENDENT_AMBULATORY_CARE_PROVIDER_SITE_OTHER): Payer: BC Managed Care – PPO | Admitting: Surgery

## 2012-12-24 VITALS — BP 138/86 | HR 71 | Temp 97.3°F | Resp 16 | Wt 127.2 lb

## 2012-12-24 DIAGNOSIS — Z09 Encounter for follow-up examination after completed treatment for conditions other than malignant neoplasm: Secondary | ICD-10-CM

## 2012-12-24 NOTE — Progress Notes (Signed)
Subjective:     Patient ID: Christian Roberts, male   DOB: 08-08-1945, 67 y.o.   MRN: [id number]  HPI He is here for his first postop visit status post bilateral laparoscopic converted to open inguinal hernia repair with mesh. From the hernia standpoint he is doing well. He has chronic pain issues regarding his back and spine  Review of Systems     Objective:   Physical Exam On exam, his incisions are well healed with no evidence of recurrent hernia or infection    Assessment:     Patient stable postop     Plan:     He may resume his normal activity in 2 weeks. I will see him back as needed

## 2013-01-24 ENCOUNTER — Telehealth: Payer: Self-pay | Admitting: Internal Medicine

## 2013-01-25 ENCOUNTER — Telehealth: Payer: Self-pay | Admitting: Internal Medicine

## 2013-01-25 NOTE — Telephone Encounter (Signed)
[facility] °Triage Call Report °Triage Record Num: [id number] Operator: Barbara Busick °Patient Name: Christian Roberts Call Date & Time: 01/24/2013 8:14:19PM °Patient Phone: [phone] PCP: Bruce H. Swords °Patient Gender: Male PCP Fax : [phone] °Patient DOB: 01/17/1945 Practice Name: [facility] °Reason for Call: °Caller: Sewell/Patient; PCP: Swords, Bruce (Adults only); CB#: [phone]; Call °regarding Diabetes, low Blood Sugar of 77. This gentleman calling re blood sugar of 77, °reports his blood sugars have never been this low before. Normally in 140's at this time of the °day and 100-130 fasting. Caller does not feel bad in any way and is doing well. Caller °advised while maybe low for him, it is a perfectly normal blood sugar. Caller advised he can °have a snack since blood sugar is low for him. °He is agreeable. °Protocol(s) Used: Information Only Call; No Symptom Triage (Adult) °Recommended Outcome per Protocol: Provide Information or Advice Only °Reason for Outcome: Health information question; person denies any °symptoms, no triage required. Information °provided from approved references or clinical °experience. °Care Advice: °~ °

## 2013-01-25 NOTE — Telephone Encounter (Signed)
[facility] Triage Call Report Triage Record Num: [id number] Operator: Noemi Chapel Patient Name: Christian Roberts Call Date & Time: 01/24/2013 8:14:19PM Patient Phone: [phone] PCP: Darrick Penna. Swords Patient Gender: Male PCP Fax : [phone] Patient DOB: November 14, 1945 Practice Name: [facility] Reason for Call: Caller: [name]/Patient; PCP: Phoebe Sharps (Adults only); CB#: [phone]; Call regarding Diabetes, low Blood Sugar of 77. This gentleman calling re blood sugar of 77, reports his blood sugars have never been this low before. Normally in 140's at this time of the day and 100-130 fasting. Caller does not feel bad in any way and is doing well. Caller advised while maybe low for him, it is a perfectly normal blood sugar. Caller advised he can have a snack since blood sugar is low for him. He is agreeable. Protocol(s) Used: Information Only Call; No Symptom Triage (Adult) Recommended Outcome per Protocol: Provide Information or Advice Only Reason for Outcome: Health information question; person denies any symptoms, no triage required. Information provided from approved references or clinical experience. Care Advice: ~

## 2013-02-16 ENCOUNTER — Telehealth: Payer: Self-pay | Admitting: Internal Medicine

## 2013-02-16 NOTE — Telephone Encounter (Signed)
Pt states [facility] has been unsuccessful with getting approval for test strips and lancets.  Pt has new glucometer(Verio IQ) and needs test strips and lancets(90 day supply) [facility] [address] [facility], [place].  Pt states he tests 3 times per day due to injection he gets that effects his glucose levels.

## 2013-02-17 MED ORDER — GLUCOSE BLOOD VI STRP: 1.0000 | ORAL_STRIP | Freq: Three times a day (TID) | Status: DC

## 2013-02-17 MED ORDER — ONETOUCH DELICA LANCETS FINE MISC: 1.0000 | Freq: Three times a day (TID) | Status: DC

## 2013-02-17 NOTE — Telephone Encounter (Signed)
rx sent in electronically 

## 2013-03-05 ENCOUNTER — Other Ambulatory Visit: Payer: BC Managed Care – PPO

## 2013-03-08 ENCOUNTER — Other Ambulatory Visit (INDEPENDENT_AMBULATORY_CARE_PROVIDER_SITE_OTHER): Payer: BC Managed Care – PPO

## 2013-03-08 DIAGNOSIS — IMO0001 Reserved for inherently not codable concepts without codable children: Secondary | ICD-10-CM

## 2013-03-08 DIAGNOSIS — I10 Essential (primary) hypertension: Secondary | ICD-10-CM

## 2013-03-08 DIAGNOSIS — E1165 Type 2 diabetes mellitus with hyperglycemia: Secondary | ICD-10-CM

## 2013-03-08 DIAGNOSIS — E785 Hyperlipidemia, unspecified: Secondary | ICD-10-CM

## 2013-03-08 LAB — HEPATIC FUNCTION PANEL
ALBUMIN: 3.9 g/dL (ref 3.5–5.2)
ALK PHOS: 58 U/L (ref 39–117)
ALT: 21 U/L (ref 0–53)
AST: 16 U/L (ref 0–37)
Bilirubin, Direct: 0 mg/dL (ref 0.0–0.3)
TOTAL PROTEIN: 6.9 g/dL (ref 6.0–8.3)
Total Bilirubin: 0.7 mg/dL (ref 0.3–1.2)

## 2013-03-08 LAB — BASIC METABOLIC PANEL
BUN: 12 mg/dL (ref 6–23)
CALCIUM: 9.8 mg/dL (ref 8.4–10.5)
CO2: 31 mEq/L (ref 19–32)
Chloride: 104 mEq/L (ref 96–112)
Creatinine, Ser: 0.9 mg/dL (ref 0.4–1.5)
GFR: 90.79 mL/min (ref >60.00)
Glucose, Bld: 136 mg/dL — ABNORMAL HIGH (ref 70–99)
Potassium: 4.8 mEq/L (ref 3.5–5.1)
Sodium: 140 mEq/L (ref 135–145)

## 2013-03-08 LAB — LIPID PANEL
CHOLESTEROL: 186 mg/dL (ref 0–200)
HDL: 60.9 mg/dL (ref >39.00)
LDL Cholesterol: 107 mg/dL — ABNORMAL HIGH (ref 0–99)
TRIGLYCERIDES: 89 mg/dL (ref 0.0–149.0)
Total CHOL/HDL Ratio: 3
VLDL: 17.8 mg/dL (ref 0.0–40.0)

## 2013-03-08 LAB — HEMOGLOBIN A1C: HEMOGLOBIN A1C: 7.6 % — AB (ref 4.6–6.5)

## 2013-03-12 ENCOUNTER — Encounter: Payer: Self-pay | Admitting: Internal Medicine

## 2013-03-12 ENCOUNTER — Ambulatory Visit (INDEPENDENT_AMBULATORY_CARE_PROVIDER_SITE_OTHER): Payer: BC Managed Care – PPO | Admitting: Internal Medicine

## 2013-03-12 VITALS — BP 124/62 | HR 76 | Temp 97.6°F | Ht 64.0 in | Wt 131.0 lb

## 2013-03-12 DIAGNOSIS — E119 Type 2 diabetes mellitus without complications: Secondary | ICD-10-CM

## 2013-03-12 DIAGNOSIS — E785 Hyperlipidemia, unspecified: Secondary | ICD-10-CM

## 2013-03-12 MED ORDER — ATORVASTATIN CALCIUM 40 MG PO TABS: 20.0000 mg | ORAL_TABLET | Freq: Every day | ORAL | Status: DC

## 2013-03-12 NOTE — Progress Notes (Signed)
Pre visit review using our clinic review tool, if applicable. No additional management support is needed unless otherwise documented below in the visit note. 

## 2013-03-12 NOTE — Progress Notes (Signed)
patient comes in for followup of multiple medical problems including type 2 diabetes, hyperlipidemia, hypertension. The patient does not check blood sugar or blood pressure at home. The patetient does not follow an exercise or diet program. The patient denies any polyuria, polydipsia.  In the past the patient has gone to diabetic treatment center. The patient is tolerating medications  Without difficulty. The patient does admit to medication compliance.   Reviewed orthopedic note.  Past Medical History  Diagnosis Date  . Allergic rhinitis   . GERD (gastroesophageal reflux disease)   . Hyperlipidemia   . Left ankle pain     chronic  . Bronchitis   . Hernia   . Diabetes mellitus     diet controlled - since 2012  . Headache(784.0)     headaches-related to sinuses & uses Tylenol   . Skin cancer, basal cell   . Osteoarthritis     ankle- left, hands & neck & hip  . Neck pain   . Complication of anesthesia   . PONV (postoperative nausea and vomiting)     WITH ETHER  . Asthma     "I outgrew it"  . Numbness of fingers   . Constipation   . Nocturia     History   Social History  . Marital Status: Married    Spouse Name: N/A    Number of Children: 3  . Years of Education: N/A   Occupational History  . Not on file.   Social History Main Topics  . Smoking status: Former Smoker    Types: Cigarettes    Quit date: 04/10/2012  . Smokeless tobacco: Never Used     Comment: quitting  . Alcohol Use: No  . Drug Use: No  . Sexual Activity: Not on file   Other Topics Concern  . Not on file   Social History Narrative   Married   Daily caffeine use- 6   Patient does not get regular exercise    Past Surgical History  Procedure Laterality Date  . Ankle surgery Left   . Esophagogastroduodenoscopy  06/27/2006  . Tonsillectomy    . Varicocelectomy    . Left ankle mass removed  1972    3 surgeries on L ankle- 72, 2008, 2009  . Neck surgery  03/11,     C3-C7  . Back surgery       cerv. fusion C1- T1, 2 separate surgeries , T1-T3 2014  . Appendectomy      1964  . Cystoscopy      multiple times for infections  . Inguinal hernia repair  03/27/2011    Procedure: HERNIA REPAIR INGUINAL ADULT;  Surgeon: Harl Bowie, MD;  Location: [facility];  Service: General;  Laterality: Left;  . Carpal tunnel release Right   . Rectal surgery      "BORN WITHOUT A RECTUM" SURGICAL CORRECTION  . Shoulder surgery  09/17/12  . Inguinal hernia repair Bilateral 12/09/2012    Procedure: LAPAROSCOPIC BILATERAL INGUINAL HERNIA REPAIR WITH MESH;  Surgeon: Harl Bowie, MD;  Location: [facility];  Service: General;  Laterality: Bilateral;  . Insertion of mesh Bilateral 12/09/2012    Procedure: INSERTION OF MESH;  Surgeon: Harl Bowie, MD;  Location: [facility];  Service: General;  Laterality: Bilateral;  . Hernia repair Bilateral 12/09/2012    with mesh    Family History  Problem Relation Age of Onset  . Stroke Mother   . Anesthesia problems Mother   . Cerebral aneurysm Father   .  Cancer Father     skin  . Diabetes Sister     hypoglycemia  . Breast cancer Sister     x 2  . Cancer Sister     breast  . Lung cancer Sister   . Cancer Sister     breast  . Emphysema Sister   . Cancer Sister     lung    Allergies  Allergen Reactions  . Aspirin Anaphylaxis  . Codeine Phosphate Anaphylaxis  . Ibuprofen Anaphylaxis  . Morphine Sulfate Anaphylaxis  . Naproxen Sodium Anaphylaxis  . Nsaids Anaphylaxis  . Sulfamethoxazole Anaphylaxis  . Levofloxacin Itching    Current Outpatient Prescriptions on File Prior to Visit  Medication Sig Dispense Refill  . acetaminophen (TYLENOL) 500 MG tablet Take 500-1,000 mg by mouth every 6 (six) hours as needed for pain. For pain      . atorvastatin (LIPITOR) 20 MG tablet Take 20 mg by mouth at bedtime.       Marland Kitchen esomeprazole (NEXIUM) 10 MG packet Take 10 mg by mouth daily before breakfast.      . fish oil-omega-3 fatty acids 1000 MG capsule Take  1 g by mouth 2 (two) times daily.       Marland Kitchen gabapentin (NEURONTIN) 300 MG capsule Take 300 mg by mouth 3 (three) times daily.      Marland Kitchen glucose blood (ONETOUCH VERIO) test strip 1 each by Other route 3 (three) times daily. Use as instructed  300 each  3  . metFORMIN (GLUCOPHAGE) 500 MG tablet Take 500 mg by mouth 2 (two) times daily with a meal.      . methocarbamol (ROBAXIN) 500 MG tablet       . methylPREDNIsolone (MEDROL DOSPACK) 4 MG tablet       . ONETOUCH DELICA LANCETS FINE MISC 1 each by Does not apply route 3 (three) times daily.  300 each  3  . oxyCODONE (OXY IR/ROXICODONE) 5 MG immediate release tablet Take 1-2 tablets (5-10 mg total) by mouth every 4 (four) hours as needed for moderate pain.  60 tablet  0  . polycarbophil (FIBERCON) 625 MG tablet Take 625 mg by mouth daily.      . pseudoephedrine (SUDAFED) 30 MG tablet Take 30 mg by mouth every 4 (four) hours as needed for congestion. For congestion      . solifenacin (VESICARE) 10 MG tablet Take 10 mg by mouth every morning.      . Tamsulosin HCl (FLOMAX) 0.4 MG CAPS Take 0.4 mg by mouth daily after supper.        No current facility-administered medications on file prior to visit.     patient denies chest pain, shortness of breath, orthopnea. Denies lower extremity edema, abdominal pain, change in appetite, change in bowel movements. Patient denies rashes, musculoskeletal complaints. No other specific complaints in a complete review of systems.    well-developed well-nourished male in no acute distress. HEENT exam atraumatic, normocephalic, neck supple without jugular venous distention. Chest clear to auscultation cardiac exam S1-S2 are regular. Abdominal exam overweight with bowel sounds, soft and nontender. Extremities no edema. Neurologic exam is alert with a normal gait.   Diabetes type 2, controlled Reasonable control. I advised daily exercise, low-fat diet. I've advised weight loss to a goal BMI of less than  25.  HYPERLIPIDEMIA LDL is greater than 100. Will increase atorvastatin to 40 mg by mouth daily.    He is scheduled for surgery (ankle fusion with dr. Mardelle Matte)

## 2013-03-12 NOTE — Assessment & Plan Note (Signed)
Reasonable control. I advised daily exercise, low-fat diet. I've advised weight loss to a goal BMI of less than 25.

## 2013-03-12 NOTE — Assessment & Plan Note (Addendum)
LDL is greater than 100. Will increase atorvastatin to 40 mg by mouth daily.

## 2013-03-17 ENCOUNTER — Telehealth: Payer: Self-pay

## 2013-03-17 NOTE — Telephone Encounter (Signed)
Relevant patient education assigned to patient using Emmi. ° °

## 2013-03-19 HISTORY — PX: ANKLE FUSION: SHX881

## 2013-05-14 ENCOUNTER — Other Ambulatory Visit: Payer: Self-pay | Admitting: Internal Medicine

## 2013-06-13 ENCOUNTER — Other Ambulatory Visit: Payer: Self-pay | Admitting: Internal Medicine

## 2013-07-14 ENCOUNTER — Telehealth: Payer: Self-pay | Admitting: Internal Medicine

## 2013-07-14 NOTE — Telephone Encounter (Signed)
Caller name: Teri  Call back number:[phone] Pharmacy: [facility]  Reason for call:   Pt will be a new transfer from Dr. Leanne Chang on 9/15, and is a diabetic.  Pt states he has a corn on little toe and is wanting something OTC or prescribed that could remove it that is safe for diabetics.  Please advise.

## 2013-07-15 NOTE — Telephone Encounter (Signed)
Left detailed message on voice mail that because he is diabetic he does NOT need to treat this at home. The toe needs to be evaluated by a physician. Advised the patient to call the office to schedule appt to be seen.

## 2013-07-19 ENCOUNTER — Ambulatory Visit (INDEPENDENT_AMBULATORY_CARE_PROVIDER_SITE_OTHER): Payer: BC Managed Care – PPO | Admitting: Internal Medicine

## 2013-07-19 ENCOUNTER — Encounter: Payer: Self-pay | Admitting: Internal Medicine

## 2013-07-19 VITALS — BP 125/70 | HR 84 | Temp 97.7°F | Wt 130.0 lb

## 2013-07-19 DIAGNOSIS — L989 Disorder of the skin and subcutaneous tissue, unspecified: Secondary | ICD-10-CM | POA: Insufficient documentation

## 2013-07-19 DIAGNOSIS — E785 Hyperlipidemia, unspecified: Secondary | ICD-10-CM

## 2013-07-19 MED ORDER — METFORMIN HCL 500 MG PO TABS: ORAL_TABLET | ORAL | Status: DC

## 2013-07-19 NOTE — Assessment & Plan Note (Signed)
Corn-- refer to podiatry Skin lesion, scalp-- refer to derm

## 2013-07-19 NOTE — Assessment & Plan Note (Signed)
Hyperlipidemia, Intolerant to Lipitor, recommend to hold Lipitor, he already has an appointment to see me in approximately 2 months, we will recheck his cholesterol then and decide what is the best next step. ? Livalo, ?Pravachol

## 2013-07-19 NOTE — Progress Notes (Signed)
Subjective:    Patient ID: Christian Roberts, male    DOB: August 10, 1945, 68 y.o.   MRN: [id number]  DOS:  07/19/2013 Type of visit - description: acute  History: Has a corn in the toe, concerned about it Has skin irritation at scalp on-off x months, usually they "fell off" after a while  Also on Lipitor, complaining of persistent and very bothersome aches and pains different from orthopedic sx    ROS Denies chest pain or SOB No  nausea, vomiting, diarrhea  Past Medical History  Diagnosis Date  . Allergic rhinitis   . GERD (gastroesophageal reflux disease)   . Hyperlipidemia   . Left ankle pain     chronic  . Bronchitis   . Hernia   . Diabetes mellitus     diet controlled - since 2012  . Headache(784.0)     headaches-related to sinuses & uses Tylenol   . Skin cancer, basal cell   . Osteoarthritis     ankle- left, hands & neck & hip  . Neck pain   . Complication of anesthesia   . PONV (postoperative nausea and vomiting)     WITH ETHER  . Asthma     "I outgrew it"  . Numbness of fingers   . Constipation   . Nocturia     Past Surgical History  Procedure Laterality Date  . Ankle surgery Left   . Esophagogastroduodenoscopy  06/27/2006  . Tonsillectomy    . Varicocelectomy    . Left ankle mass removed  1972    3 surgeries on L ankle- 73, 2008, 2009  . Neck surgery  03/11,     C3-C7  . Back surgery      cerv. fusion C1- T1, 2 separate surgeries , T1-T3 2014  . Appendectomy      1964  . Cystoscopy      multiple times for infections  . Inguinal hernia repair  03/27/2011    Procedure: HERNIA REPAIR INGUINAL ADULT;  Surgeon: Harl Bowie, MD;  Location: [facility];  Service: General;  Laterality: Left;  . Carpal tunnel release Right   . Rectal surgery      "BORN WITHOUT A RECTUM" SURGICAL CORRECTION  . Shoulder surgery  09/17/12  . Inguinal hernia repair Bilateral 12/09/2012    Procedure: LAPAROSCOPIC BILATERAL INGUINAL HERNIA REPAIR WITH MESH;  Surgeon: Harl Bowie, MD;  Location: [facility];  Service: General;  Laterality: Bilateral;  . Insertion of mesh Bilateral 12/09/2012    Procedure: INSERTION OF MESH;  Surgeon: Harl Bowie, MD;  Location: [facility];  Service: General;  Laterality: Bilateral;  . Hernia repair Bilateral 12/09/2012    with mesh    History   Social History  . Marital Status: Married    Spouse Name: N/A    Number of Children: 3  . Years of Education: N/A   Occupational History  . Not on file.   Social History Main Topics  . Smoking status: Former Smoker    Types: Cigarettes    Quit date: 04/10/2012  . Smokeless tobacco: Never Used     Comment: quitting  . Alcohol Use: No  . Drug Use: No  . Sexual Activity: Not on file   Other Topics Concern  . Not on file   Social History Narrative   Married   Daily caffeine use- 6   Patient does not get regular exercise        Medication List  This list is accurate as of: 07/19/13 12:50 PM.  Always use your most recent med list.               acetaminophen 500 MG tablet  Commonly known as:  TYLENOL  Take 500-1,000 mg by mouth every 6 (six) hours as needed for pain. For pain     Capsaicin 0.025 % Gel  Apply topically.     DOCUSATE SODIUM PO  Take by mouth.     esomeprazole 10 MG packet  Commonly known as:  NEXIUM  Take 10 mg by mouth daily before breakfast.     fish oil-omega-3 fatty acids 1000 MG capsule  Take 1 g by mouth 2 (two) times daily.     gabapentin 300 MG capsule  Commonly known as:  NEURONTIN  Take 300 mg by mouth 5 (five) times daily.     GAS-X PO  Take by mouth.     glucose blood test strip  Commonly known as:  ONETOUCH VERIO  1 each by Other route 3 (three) times daily. Use as instructed     lidocaine 5 %  Commonly known as:  LIDODERM     metFORMIN 500 MG tablet  Commonly known as:  GLUCOPHAGE  TAKE 1 TWICE DAILY WITH MEAL     MYRBETRIQ 50 MG Tb24 tablet  Generic drug:  mirabegron ER     ONETOUCH DELICA LANCETS  FINE Misc  1 each by Does not apply route 3 (three) times daily.     oxyCODONE 5 MG immediate release tablet  Commonly known as:  Oxy IR/ROXICODONE  Take 1-2 tablets (5-10 mg total) by mouth every 4 (four) hours as needed for moderate pain.     polycarbophil 625 MG tablet  Commonly known as:  FIBERCON  Take 625 mg by mouth daily.     pseudoephedrine 30 MG tablet  Commonly known as:  SUDAFED  Take 30 mg by mouth every 4 (four) hours as needed for congestion. For congestion     solifenacin 10 MG tablet  Commonly known as:  VESICARE  Take 10 mg by mouth every morning.     tamsulosin 0.4 MG Caps capsule  Commonly known as:  FLOMAX  Take 0.4 mg by mouth daily after supper.     ZIMS MAX-FREEZE 3.7 % Gel  Generic drug:  Menthol (Topical Analgesic)  Apply topically.           Objective:   Physical Exam BP 125/70  Pulse 84  Temp(Src) 97.7 F (36.5 C)  Wt 130 lb (58.968 kg)  SpO2 97%  General -- alert, well-developed, NAD.   Extremities-- + periankle -soft    Edema L . At the R 5th toe has a corn w/o redness  Scalp-- several scally lesions  Neurologic--  alert & oriented X3. Speech normal, gait limited  Psych-- Cognition and judgment appear intact. Cooperative with normal attention span and concentration. No anxious or depressed appearing.       Assessment & Plan:     Edema-- periankle edema, rec leg elevation

## 2013-07-19 NOTE — Progress Notes (Signed)
Pre visit review using our clinic review tool, if applicable. No additional management support is needed unless otherwise documented below in the visit note. 

## 2013-07-19 NOTE — Patient Instructions (Signed)
Stop  lipitor (atorvastatin), see if the muscle aches improve See you in September

## 2013-07-30 ENCOUNTER — Encounter: Payer: Self-pay | Admitting: Podiatry

## 2013-07-30 ENCOUNTER — Ambulatory Visit (INDEPENDENT_AMBULATORY_CARE_PROVIDER_SITE_OTHER): Payer: BC Managed Care – PPO | Admitting: Podiatry

## 2013-07-30 ENCOUNTER — Ambulatory Visit (INDEPENDENT_AMBULATORY_CARE_PROVIDER_SITE_OTHER): Payer: BC Managed Care – PPO

## 2013-07-30 ENCOUNTER — Other Ambulatory Visit: Payer: Self-pay | Admitting: *Deleted

## 2013-07-30 VITALS — BP 127/71 | HR 90 | Resp 16 | Ht 64.0 in | Wt 130.0 lb

## 2013-07-30 DIAGNOSIS — M2041 Other hammer toe(s) (acquired), right foot: Secondary | ICD-10-CM

## 2013-07-30 DIAGNOSIS — M2040 Other hammer toe(s) (acquired), unspecified foot: Secondary | ICD-10-CM

## 2013-07-30 DIAGNOSIS — L84 Corns and callosities: Secondary | ICD-10-CM

## 2013-07-30 DIAGNOSIS — M779 Enthesopathy, unspecified: Secondary | ICD-10-CM

## 2013-07-30 MED ORDER — TRIAMCINOLONE ACETONIDE 10 MG/ML IJ SUSP
10.0000 mg | Freq: Once | INTRAMUSCULAR | Status: AC
  Administered 2013-07-30: 10 mg

## 2013-07-30 NOTE — Progress Notes (Signed)
   Subjective:    Patient ID: Christian Roberts, male    DOB: 20-Aug-1945, 68 y.o.   MRN: [id number]  HPI Comments: i have a corn on the right little toe, it rubs in my shoe. Need to have it checked , i am diabetic. Diabetes 2-3 years, last blood sugar this am was127      Review of Systems  Endocrine:       Diabetes       Objective:   Physical Exam        Assessment & Plan:

## 2013-07-31 NOTE — Progress Notes (Signed)
Subjective:     Patient ID: Christian Roberts, male   DOB: [birth year], 68 y.o.   MRN: [id number]  HPI patient presents with a painful inflamed fifth digit right foot that he has trouble with. He's had numerous knee surgeries and leg surgeries and does not one a problem with this toe   Review of Systems  All other systems reviewed and are negative.      Objective:   Physical Exam  Nursing note and vitals reviewed. Constitutional: He is oriented to person, place, and time.  Cardiovascular: Intact distal pulses.   Musculoskeletal: Normal range of motion.  Neurological: He is oriented to person, place, and time.  Skin: Skin is warm and dry.   neurovascular status is intact with muscle strength adequate and range of motion mildly diminished subtalar midtarsal joint. Patient has ankle surgery left and that is continuing the heel that was done in March and has a keratotic lesion fifth digit right that upon palpation is inflamed and sore at the interphalangeal joint     Assessment:     Hammertoe deformity fifth right with inflammation of the right fifth toe and fluid buildup around the joint surface and healing from fracture and ankle that was fused in March    Plan:     H&P and x-rays reviewed and careful interphalangeal joint administered. I then using sharp instrumentation debris did a small keratotic porokeratotic type lesions and applied padding and reappoint as needed with symptoms

## 2013-08-23 ENCOUNTER — Telehealth: Payer: Self-pay | Admitting: Internal Medicine

## 2013-08-23 NOTE — Telephone Encounter (Addendum)
Caller name: [employer]  Relation to pt: Rosann Auerbach  Call back number: [phone] Fax # [phone]   Reason for call: form faxed over 08/19/13 requesting knee support and back support form to be filled out and faxed back

## 2013-08-24 NOTE — Telephone Encounter (Signed)
Advise patient, needs office visit to fill out the form

## 2013-08-24 NOTE — Telephone Encounter (Signed)
Waiting on Dr. Ethel Rana approval and signature.

## 2013-08-25 NOTE — Telephone Encounter (Signed)
Called and spoke with Pt, Pt has appt scheduled for 09/17/2013 at 1000. He stated that he can wait til then to get forms filled out for knee brace.

## 2013-09-16 ENCOUNTER — Other Ambulatory Visit: Payer: Self-pay | Admitting: Internal Medicine

## 2013-09-17 ENCOUNTER — Ambulatory Visit (INDEPENDENT_AMBULATORY_CARE_PROVIDER_SITE_OTHER): Payer: BC Managed Care – PPO | Admitting: Internal Medicine

## 2013-09-17 ENCOUNTER — Ambulatory Visit: Payer: BC Managed Care – PPO | Admitting: Internal Medicine

## 2013-09-17 ENCOUNTER — Encounter: Payer: Self-pay | Admitting: Internal Medicine

## 2013-09-17 VITALS — BP 120/60 | HR 78 | Temp 97.6°F | Ht 64.0 in | Wt 133.4 lb

## 2013-09-17 DIAGNOSIS — Z23 Encounter for immunization: Secondary | ICD-10-CM

## 2013-09-17 DIAGNOSIS — M1990 Unspecified osteoarthritis, unspecified site: Secondary | ICD-10-CM

## 2013-09-17 DIAGNOSIS — E119 Type 2 diabetes mellitus without complications: Secondary | ICD-10-CM

## 2013-09-17 DIAGNOSIS — E785 Hyperlipidemia, unspecified: Secondary | ICD-10-CM

## 2013-09-17 DIAGNOSIS — N401 Enlarged prostate with lower urinary tract symptoms: Secondary | ICD-10-CM

## 2013-09-17 DIAGNOSIS — N138 Other obstructive and reflux uropathy: Secondary | ICD-10-CM

## 2013-09-17 NOTE — Patient Instructions (Addendum)
Stop by the front desk and schedule labs to be done within few days (fasting) BMP, A1C--- dx DM FLP-- dx high cholesterol  Please come back to the office in 3-4 months fasting for a physical exam

## 2013-09-17 NOTE — Progress Notes (Signed)
Subjective:    Patient ID: Christian Roberts, male    DOB: 1945-11-01, 68 y.o.   MRN: [id number]  DOS:  09/17/2013 Type of visit - description : routine Interval history: DJD, he has extensive DJD history, pain is limiting his physical activity, needs prescriptions for a back brace and knee sleeves. History of neuropathy, on gabapentin Diabetes, good compliance with metformin, ambulatory CBGs 80, 110. High cholesterol, d/c  Lipitor as recommended, reports that his aches and pains in general decreased, also his blood sugars decreased significantly and he thinks it was related to Lipitor.    ROS Denies chest pain or difficulty breathing No nausea, vomiting, diarrhea  Past Medical History  Diagnosis Date  . Allergic rhinitis   . GERD (gastroesophageal reflux disease)   . Hyperlipidemia   . Hernia   . Diabetes mellitus     diet controlled - since 2012  . Headache(784.0)     headaches-related to sinuses & uses Tylenol   . Skin cancer, basal cell     sees derm  . Osteoarthritis     ankle- left, hands & neck & hip  . Complication of anesthesia   . PONV (postoperative nausea and vomiting)     WITH ETHER  . Asthma     "I outgrew it"  . Numbness of fingers   . Constipation   . Nocturia     Past Surgical History  Procedure Laterality Date  . Ankle surgery Left   . Esophagogastroduodenoscopy  06/27/2006  . Tonsillectomy    . Varicocelectomy    . Left ankle mass removed  1972    3 surgeries on L ankle- 73, 2008, 2009  . Neck surgery  03/11,     C3-C7  . Back surgery      cerv. fusion C1- T1, 2 separate surgeries , T1-T3 2014  . Appendectomy      1964  . Cystoscopy      multiple times for infections  . Inguinal hernia repair  03/27/2011    Procedure: HERNIA REPAIR INGUINAL ADULT;  Surgeon: Harl Bowie, MD;  Location: [facility];  Service: General;  Laterality: Left;  . Carpal tunnel release Right   . Rectal surgery      "BORN WITHOUT A RECTUM" SURGICAL CORRECTION  .  Shoulder surgery  09/17/12  . Inguinal hernia repair Bilateral 12/09/2012    Procedure: LAPAROSCOPIC BILATERAL INGUINAL HERNIA REPAIR WITH MESH;  Surgeon: Harl Bowie, MD;  Location: [facility];  Service: General;  Laterality: Bilateral;  . Insertion of mesh Bilateral 12/09/2012    Procedure: INSERTION OF MESH;  Surgeon: Harl Bowie, MD;  Location: [facility];  Service: General;  Laterality: Bilateral;  . Ankle fusion Left 03-19-13    Dr Mardelle Matte    History   Social History  . Marital Status: Married    Spouse Name: N/A    Number of Children: 3  . Years of Education: N/A   Occupational History  . 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 Smoking status: Former Smoker    Types: Cigarettes    Quit date: 04/10/2012  . Smokeless tobacco: Never Used     Comment: quit on-off since 2014   . Alcohol Use: No  . Drug Use: No  . Sexual Activity: Not on file   Other Topics Concern  . Not on file   Social History Narrative   Married , lives w/ wife  Medication List       This list is accurate as of: 09/17/13 11:52 AM.  Always use your most recent med list.               acetaminophen 500 MG tablet  Commonly known as:  TYLENOL  Take 500-1,000 mg by mouth every 6 (six) hours as needed for pain. For pain     Capsaicin 0.025 % Gel  Apply topically.     DOCUSATE SODIUM PO  Take by mouth.     esomeprazole 10 MG packet  Commonly known as:  NEXIUM  Take 10 mg by mouth daily before breakfast.     fish oil-omega-3 fatty acids 1000 MG capsule  Take 1 g by mouth 2 (two) times daily.     gabapentin 300 MG capsule  Commonly known as:  NEURONTIN  Take 300 mg by mouth 5 (five) times daily.     GAS-X PO  Take by mouth.     glucose blood test strip  Commonly known as:  ONETOUCH VERIO  1 each by Other route 3 (three) times daily. Use as instructed     lidocaine 5 %  Commonly known as:  LIDODERM     metFORMIN 500 MG tablet  Commonly known as:   GLUCOPHAGE  TAKE 1 TWICE DAILY WITH MEAL     MYRBETRIQ 50 MG Tb24 tablet  Generic drug:  mirabegron ER     ONETOUCH DELICA LANCETS FINE Misc  1 each by Does not apply route 3 (three) times daily.     polycarbophil 625 MG tablet  Commonly known as:  FIBERCON  Take 625 mg by mouth daily.     pseudoephedrine 30 MG tablet  Commonly known as:  SUDAFED  Take 30 mg by mouth every 4 (four) hours as needed for congestion. For congestion     tamsulosin 0.4 MG Caps capsule  Commonly known as:  FLOMAX  Take 0.4 mg by mouth daily after supper.     ZIMS MAX-FREEZE 3.7 % Gel  Generic drug:  Menthol (Topical Analgesic)  Apply topically.           Objective:   Physical Exam BP 120/60  Pulse 78  Temp(Src) 97.6 F (36.4 C) (Oral)  Ht 5\' 4"  (1.626 m)  Wt 133 lb 6 oz (60.499 kg)  BMI 22.88 kg/m2  SpO2 98%  General -- alert, well-developed, NAD.   Lungs -- normal respiratory effort, no intercostal retractions, no accessory muscle use, and normal breath sounds.  Heart-- normal rate, regular rhythm, no murmur.   Extremities-- ++ L periankle  edema  Neurologic--  alert & oriented X3. Speech normal; gait Quite limited by DJD pain . Psych-- Cognition and judgment appear intact. Cooperative with normal attention span and concentration. No anxious or depressed appearing.       Assessment & Plan:

## 2013-09-17 NOTE — Assessment & Plan Note (Signed)
Per our last conversation he discontinue atorvastatin, noted his aches and pains in general decreased to some extent. Also note that his blood sugar decreased. Plan: Labs

## 2013-09-17 NOTE — Assessment & Plan Note (Signed)
His to severe DJD, pain in multiple sites including back, knees and ankles. Mobility quite limited. Managing pain with Tylenol as needed,Lidoderm, capsaicin,Also topical analgesics. Needs a prescription for knee sleeves and  a back brace which is faxed

## 2013-09-17 NOTE — Assessment & Plan Note (Signed)
History of diabetes, currently on a low dose of metformin. Diet according to the patient is healthy, he is unable to exercise. Last A1c elevated, concept of A1c discussed with the patient. Plan: Labs, further advice for results, increase metformin dose ?

## 2013-09-17 NOTE — Progress Notes (Signed)
Pre visit review using our clinic review tool, if applicable. No additional management support is needed unless otherwise documented below in the visit note. 

## 2013-09-17 NOTE — Assessment & Plan Note (Signed)
Sees urology twice a year, currently on Flomax and merbytreq

## 2013-09-21 ENCOUNTER — Telehealth: Payer: Self-pay | Admitting: Internal Medicine

## 2013-09-21 MED ORDER — METFORMIN HCL 500 MG PO TABS: ORAL_TABLET | ORAL | Status: DC

## 2013-09-21 NOTE — Telephone Encounter (Signed)
Pt is needing new rx metFORMIN (GLUCOPHAGE) 500 MG tablet, pt states he didn't realize he was almost out of his meds at his last visit. Please send [facility] on [address].

## 2013-09-21 NOTE — Telephone Encounter (Signed)
Medication refilled to [facility].

## 2013-09-23 ENCOUNTER — Other Ambulatory Visit (INDEPENDENT_AMBULATORY_CARE_PROVIDER_SITE_OTHER): Payer: BC Managed Care – PPO

## 2013-09-23 DIAGNOSIS — E1310 Other specified diabetes mellitus with ketoacidosis without coma: Secondary | ICD-10-CM

## 2013-09-23 DIAGNOSIS — E1110 Type 2 diabetes mellitus with ketoacidosis without coma: Secondary | ICD-10-CM

## 2013-09-23 LAB — LIPID PANEL
CHOL/HDL RATIO: 4
CHOLESTEROL: 194 mg/dL (ref 0–200)
HDL: 51.8 mg/dL (ref >39.00)
LDL Cholesterol: 128 mg/dL — ABNORMAL HIGH (ref 0–99)
NonHDL: 142.2
TRIGLYCERIDES: 69 mg/dL (ref 0.0–149.0)
VLDL: 13.8 mg/dL (ref 0.0–40.0)

## 2013-09-23 LAB — BASIC METABOLIC PANEL
BUN: 13 mg/dL (ref 6–23)
CO2: 31 mEq/L (ref 19–32)
CREATININE: 1 mg/dL (ref 0.4–1.5)
Calcium: 9.3 mg/dL (ref 8.4–10.5)
Chloride: 105 mEq/L (ref 96–112)
GFR: 82.07 mL/min (ref >60.00)
Glucose, Bld: 125 mg/dL — ABNORMAL HIGH (ref 70–99)
Potassium: 4 mEq/L (ref 3.5–5.1)
SODIUM: 141 meq/L (ref 135–145)

## 2013-09-23 LAB — HEMOGLOBIN A1C: Hgb A1c MFr Bld: 6.9 % — ABNORMAL HIGH (ref 4.6–6.5)

## 2013-09-30 MED ORDER — PRAVASTATIN SODIUM 20 MG PO TABS: 20.0000 mg | ORAL_TABLET | Freq: Every day | ORAL | Status: DC

## 2013-10-08 ENCOUNTER — Telehealth: Payer: Self-pay

## 2013-10-08 DIAGNOSIS — E785 Hyperlipidemia, unspecified: Secondary | ICD-10-CM

## 2013-10-08 MED ORDER — SIMVASTATIN 20 MG PO TABS: 20.0000 mg | ORAL_TABLET | Freq: Every day | ORAL | Status: DC

## 2013-10-08 NOTE — Telephone Encounter (Signed)
Received certificate of medical necessity form from [place] for Pt to receive compression stockings. Forms completed by Dr. Larose Kells and faxed back to [facility]. Forms placed in folder to be scanned.

## 2013-10-08 NOTE — Telephone Encounter (Signed)
Spoke with Pt, informed him to discontinue pravastatin, start simvastatin 1 daily at bedtime (sent to [facility]), Pt has lab appt for 11/16 for fasting labs, FLP, AST, ALT (orders already placed).

## 2013-10-08 NOTE — Telephone Encounter (Signed)
Spoke with Pt, he stopped taking his pravastatin on 9/27 because he stated he was having chest pains, tightness, palpitations and arm pain that he believed that was coming from the medication. He also stated that the pain and palpitations stopped after taking pravastatin, he was wondering if their is another medication he can try instead.

## 2013-10-08 NOTE — Telephone Encounter (Signed)
Discontinue pravastatin. Start simvastatin 20 mg one by mouth each bedtime #30 and 3 refills, FLP, AST, ALT in 6 weeks. If he has chest pain again or palpitations ---  call or go to the ER

## 2013-11-22 ENCOUNTER — Other Ambulatory Visit (INDEPENDENT_AMBULATORY_CARE_PROVIDER_SITE_OTHER): Payer: BC Managed Care – PPO

## 2013-11-22 DIAGNOSIS — E785 Hyperlipidemia, unspecified: Secondary | ICD-10-CM

## 2013-11-22 LAB — LIPID PANEL
CHOL/HDL RATIO: 3
CHOLESTEROL: 161 mg/dL (ref 0–200)
HDL: 48.2 mg/dL (ref >39.00)
LDL CALC: 97 mg/dL (ref 0–99)
NonHDL: 112.8
TRIGLYCERIDES: 80 mg/dL (ref 0.0–149.0)
VLDL: 16 mg/dL (ref 0.0–40.0)

## 2013-11-22 LAB — ALT: ALT: 24 U/L (ref 0–53)

## 2013-11-22 LAB — AST: AST: 21 U/L (ref 0–37)

## 2013-11-25 MED ORDER — SIMVASTATIN 20 MG PO TABS: 20.0000 mg | ORAL_TABLET | Freq: Every day | ORAL | Status: DC

## 2013-12-10 ENCOUNTER — Other Ambulatory Visit (HOSPITAL_COMMUNITY): Payer: Self-pay | Admitting: Urology

## 2013-12-10 DIAGNOSIS — R972 Elevated prostate specific antigen [PSA]: Secondary | ICD-10-CM

## 2013-12-23 ENCOUNTER — Other Ambulatory Visit: Payer: Self-pay

## 2013-12-23 MED ORDER — SIMVASTATIN 20 MG PO TABS: 20.0000 mg | ORAL_TABLET | Freq: Every day | ORAL | Status: DC

## 2013-12-29 ENCOUNTER — Encounter: Payer: Self-pay | Admitting: Medical

## 2013-12-29 ENCOUNTER — Ambulatory Visit (INDEPENDENT_AMBULATORY_CARE_PROVIDER_SITE_OTHER): Payer: BC Managed Care – PPO | Admitting: Medical

## 2013-12-29 VITALS — BP 151/78 | HR 107 | Temp 98.5°F | Ht 64.0 in | Wt 132.6 lb

## 2013-12-29 DIAGNOSIS — L089 Local infection of the skin and subcutaneous tissue, unspecified: Secondary | ICD-10-CM

## 2013-12-29 MED ORDER — DOXYCYCLINE HYCLATE 100 MG PO TABS: 100.0000 mg | ORAL_TABLET | Freq: Two times a day (BID) | ORAL | Status: DC

## 2013-12-29 NOTE — Progress Notes (Signed)
Pre visit review using our clinic review tool, if applicable. No additional management support is needed unless otherwise documented below in the visit note. 

## 2013-12-29 NOTE — Patient Instructions (Signed)
Your appear to have skin infection of rt forearm. I will prescribe doxycycline antibiotic.  Follow up in 7 days or as needed.  You already have appointment with Dr. Larose Kells in one week. Important to show him both rt forearm area and left wrist area lesion(that may only be seborrheic keratosis.) Since if area change or worsen then dermatologist appointment may be needed.

## 2013-12-29 NOTE — Progress Notes (Signed)
Subjective:    Patient ID: Christian Roberts, male    DOB: 09-16-1945, 68 y.o.   MRN: [id number]  HPI   Pt in with red area on his rt forearm for past month.At first looked just red and mild inlfamed. Pt has placed some hydrocortisone but did not improve. Then 2 nights ago got bright red, swollen and tender to touch. Pt had mrsa one time on his left thumb.  Also he just noticed small area on left wrist and he wants my opinion. No pain. No discharge. He thinks this are is new.  Past Medical History  Diagnosis Date  . Allergic rhinitis   . GERD (gastroesophageal reflux disease)   . Hyperlipidemia   . Hernia   . Diabetes mellitus     diet controlled - since 2012  . Headache(784.0)     headaches-related to sinuses & uses Tylenol   . Skin cancer, basal cell     sees derm  . Osteoarthritis     ankle- left, hands & neck & hip  . Complication of anesthesia   . PONV (postoperative nausea and vomiting)     WITH ETHER  . Asthma     "I outgrew it"  . Numbness of fingers   . Constipation   . Nocturia     History   Social History  . Marital Status: Married    Spouse Name: N/A    Number of Children: 3  . Years of Education: N/A   Occupational History  . 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 Smoking status: Current Every Day Smoker    Types: Cigarettes    Last Attempt to Quit: 04/10/2012  . Smokeless tobacco: Never Used     Comment: quit on-off since 2014   . Alcohol Use: No  . Drug Use: No  . Sexual Activity: Not on file   Other Topics Concern  . Not on file   Social History Narrative   Married , lives w/ wife    Past Surgical History  Procedure Laterality Date  . Ankle surgery Left   . Esophagogastroduodenoscopy  06/27/2006  . Tonsillectomy    . Varicocelectomy    . Left ankle mass removed  1972    3 surgeries on L ankle- 73, 2008, 2009  . Neck surgery  03/11,     C3-C7  . Back surgery      cerv. fusion C1- T1, 2 separate surgeries ,  T1-T3 2014  . Appendectomy      1964  . Cystoscopy      multiple times for infections  . Inguinal hernia repair  03/27/2011    Procedure: HERNIA REPAIR INGUINAL ADULT;  Surgeon: Harl Bowie, MD;  Location: [facility];  Service: General;  Laterality: Left;  . Carpal tunnel release Right   . Rectal surgery      "BORN WITHOUT A RECTUM" SURGICAL CORRECTION  . Shoulder surgery  09/17/12  . Inguinal hernia repair Bilateral 12/09/2012    Procedure: LAPAROSCOPIC BILATERAL INGUINAL HERNIA REPAIR WITH MESH;  Surgeon: Harl Bowie, MD;  Location: [facility];  Service: General;  Laterality: Bilateral;  . Insertion of mesh Bilateral 12/09/2012    Procedure: INSERTION OF MESH;  Surgeon: Harl Bowie, MD;  Location: [facility];  Service: General;  Laterality: Bilateral;  . Ankle fusion Left 03-19-13    Dr Mardelle Matte    Family History  Problem Relation Age of Onset  . Stroke Mother   .  Anesthesia problems Mother   . Cerebral aneurysm Father   . Cancer Father     skin  . Diabetes Sister     hypoglycemia  . Breast cancer Sister     x 2  . Cancer Sister     breast  . Lung cancer Sister   . Cancer Sister     breast  . Emphysema Sister   . Cancer Sister     lung    Allergies  Allergen Reactions  . Aspirin Anaphylaxis  . Codeine Phosphate Anaphylaxis  . Ibuprofen Anaphylaxis  . Morphine Sulfate Anaphylaxis  . Naproxen Sodium Anaphylaxis  . Nsaids Anaphylaxis  . Sulfamethoxazole Anaphylaxis  . Levofloxacin Itching    Current Outpatient Prescriptions on File Prior to Visit  Medication Sig Dispense Refill  . acetaminophen (TYLENOL) 500 MG tablet Take 500-1,000 mg by mouth every 6 (six) hours as needed for pain. For pain    . DOCUSATE SODIUM PO Take by mouth.    . esomeprazole (NEXIUM) 10 MG packet Take 10 mg by mouth daily before breakfast.    . fish oil-omega-3 fatty acids 1000 MG capsule Take 1 g by mouth 2 (two) times daily.     Marland Kitchen gabapentin (NEURONTIN) 300 MG capsule Take 300 mg by  mouth 5 (five) times daily.     Marland Kitchen glucose blood (ONETOUCH VERIO) test strip 1 each by Other route 3 (three) times daily. Use as instructed 300 each 3  . lidocaine (LIDODERM) 5 %     . Menthol, Topical Analgesic, (ZIMS MAX-FREEZE) 3.7 % GEL Apply topically.    . metFORMIN (GLUCOPHAGE) 500 MG tablet TAKE 1 TWICE DAILY WITH MEAL 60 tablet 5  . MYRBETRIQ 50 MG TB24 tablet     . ONETOUCH DELICA LANCETS FINE MISC 1 each by Does not apply route 3 (three) times daily. 300 each 3  . polycarbophil (FIBERCON) 625 MG tablet Take 625 mg by mouth daily.    . pseudoephedrine (SUDAFED) 30 MG tablet Take 30 mg by mouth every 4 (four) hours as needed for congestion. For congestion    . Simethicone (GAS-X PO) Take by mouth.    . simvastatin (ZOCOR) 20 MG tablet Take 1 tablet (20 mg total) by mouth at bedtime. 90 tablet 1  . Tamsulosin HCl (FLOMAX) 0.4 MG CAPS Take 0.4 mg by mouth daily after supper.      No current facility-administered medications on file prior to visit.    BP 151/78 mmHg  Pulse 107  Temp(Src) 98.5 F (36.9 C) (Oral)  Ht 5\' 4"  (1.626 m)  Wt 132 lb 9.6 oz (60.147 kg)  BMI 22.75 kg/m2  SpO2 96%     Review of Systems  Constitutional: Negative for fever, chills and fatigue.  Respiratory: Negative for cough, chest tightness, shortness of breath and wheezing.   Cardiovascular: Negative for chest pain and palpitations.  Skin:       Rt area proximal forearm and tender/red.       Objective:   Physical Exam   General- No acute distress. Pleasant patient. Neck- Full range of motion, no jvd Lungs- Clear, even and unlabored. Heart- regular rate and rhythm. Neurologic- CNII- XII grossly intact. Rt forearm- proximal aspect red raised area  About 8 mm scab vs hyperpigmented spot in the middle. Area is red and tender to palpation. No yellow discharge.  Lt wrist- at distal ulna region new small 3 mm lesion which he just noted.(Appears seborrheic keratosis like)  Assessment &  Plan:

## 2013-12-29 NOTE — Assessment & Plan Note (Signed)
You appear to have skin infection of rt forearm. I will prescribe doxycycline antibiotic.  Follow up in 7 days or as needed.  You already have appointment with Dr. Larose Kells in one week. Important to show him both rt forearm area and left wrist area lesion(that may only be seborrheic keratosis.) Since if area change or worsen then dermatologist appointment may be needed.  Pt agreed he would point out those areas when sees Dr. Larose Kells in 7 days.

## 2014-01-06 ENCOUNTER — Ambulatory Visit (HOSPITAL_COMMUNITY)
Admission: RE | Admit: 2014-01-06 | Discharge: 2014-01-06 | Disposition: A | Discharge status: Home / Self Care | Payer: BC Managed Care – PPO | Source: Ambulatory Visit | Attending: Urology | Admitting: Urology

## 2014-01-06 DIAGNOSIS — R972 Elevated prostate specific antigen [PSA]: Secondary | ICD-10-CM | POA: Diagnosis not present

## 2014-01-06 LAB — CREATININE, SERUM
CREATININE: 1.09 mg/dL (ref 0.50–1.35)
GFR calc Af Amer: 79 mL/min — ABNORMAL LOW (ref >90)
GFR, EST NON AFRICAN AMERICAN: 68 mL/min — AB (ref >90)

## 2014-01-06 MED ORDER — GADOBENATE DIMEGLUMINE 529 MG/ML IV SOLN
15.0000 mL | Freq: Once | INTRAVENOUS | Status: AC
  Administered 2014-01-06: 12 mL via INTRAVENOUS

## 2014-01-07 DIAGNOSIS — M75120 Complete rotator cuff tear or rupture of unspecified shoulder, not specified as traumatic: Secondary | ICD-10-CM

## 2014-01-07 HISTORY — DX: Complete rotator cuff tear or rupture of unspecified shoulder, not specified as traumatic: M75.120

## 2014-01-20 ENCOUNTER — Encounter: Payer: Self-pay | Admitting: Internal Medicine

## 2014-01-20 ENCOUNTER — Ambulatory Visit (INDEPENDENT_AMBULATORY_CARE_PROVIDER_SITE_OTHER): Payer: BLUE CROSS/BLUE SHIELD | Admitting: Internal Medicine

## 2014-01-20 VITALS — BP 133/70 | HR 97 | Temp 97.6°F | Ht 64.0 in | Wt 132.2 lb

## 2014-01-20 DIAGNOSIS — M069 Rheumatoid arthritis, unspecified: Secondary | ICD-10-CM

## 2014-01-20 DIAGNOSIS — K219 Gastro-esophageal reflux disease without esophagitis: Secondary | ICD-10-CM

## 2014-01-20 DIAGNOSIS — R972 Elevated prostate specific antigen [PSA]: Secondary | ICD-10-CM

## 2014-01-20 DIAGNOSIS — E785 Hyperlipidemia, unspecified: Secondary | ICD-10-CM

## 2014-01-20 DIAGNOSIS — Z0000 Encounter for general adult medical examination without abnormal findings: Secondary | ICD-10-CM

## 2014-01-20 DIAGNOSIS — E119 Type 2 diabetes mellitus without complications: Secondary | ICD-10-CM

## 2014-01-20 HISTORY — DX: Rheumatoid arthritis, unspecified: M06.9

## 2014-01-20 LAB — TSH: TSH: 1.17 u[IU]/mL (ref 0.35–4.50)

## 2014-01-20 LAB — ALT: ALT: 18 U/L (ref 0–53)

## 2014-01-20 LAB — AST: AST: 19 U/L (ref 0–37)

## 2014-01-20 LAB — HEMOGLOBIN A1C: Hgb A1c MFr Bld: 7.8 % — ABNORMAL HIGH (ref 4.6–6.5)

## 2014-01-20 NOTE — Assessment & Plan Note (Signed)
Currently on H2 blockers, plans to go back on Nexium as soon as possible, cost is an issue

## 2014-01-20 NOTE — Progress Notes (Signed)
Pre visit review using our clinic review tool, if applicable. No additional management support is needed unless otherwise documented below in the visit note. 

## 2014-01-20 NOTE — Patient Instructions (Signed)
Get your blood work before you leave    Please come back to the office in 6 months  for a routine check up   Come back fasting        Fall Prevention and Home Safety Falls cause injuries and can affect all age groups. It is possible to use preventive measures to significantly decrease the likelihood of falls. There are many simple measures which can make your home safer and prevent falls. OUTDOORS  Repair cracks and edges of walkways and driveways.  Remove high doorway thresholds.  Trim shrubbery on the main path into your home.  Have good outside lighting.  Clear walkways of tools, rocks, debris, and clutter.  Check that handrails are not broken and are securely fastened. Both sides of steps should have handrails.  Have leaves, snow, and ice cleared regularly.  Use sand or salt on walkways during winter months.  In the garage, clean up grease or oil spills. BATHROOM  Install night lights.  Install grab bars by the toilet and in the tub and shower.  Use non-skid mats or decals in the tub or shower.  Place a plastic non-slip stool in the shower to sit on, if needed.  Keep floors dry and clean up all water on the floor immediately.  Remove soap buildup in the tub or shower on a regular basis.  Secure bath mats with non-slip, double-sided rug tape.  Remove throw rugs and tripping hazards from the floors. BEDROOMS  Install night lights.  Make sure a bedside light is easy to reach.  Do not use oversized bedding.  Keep a telephone by your bedside.  Have a firm chair with side arms to use for getting dressed.  Remove throw rugs and tripping hazards from the floor. KITCHEN  Keep handles on pots and pans turned toward the center of the stove. Use back burners when possible.  Clean up spills quickly and allow time for drying.  Avoid walking on wet floors.  Avoid hot utensils and knives.  Position shelves so they are not too high or low.  Place commonly  used objects within easy reach.  If necessary, use a sturdy step stool with a grab bar when reaching.  Keep electrical cables out of the way.  Do not use floor polish or wax that makes floors slippery. If you must use wax, use non-skid floor wax.  Remove throw rugs and tripping hazards from the floor. STAIRWAYS  Never leave objects on stairs.  Place handrails on both sides of stairways and use them. Fix any loose handrails. Make sure handrails on both sides of the stairways are as long as the stairs.  Check carpeting to make sure it is firmly attached along stairs. Make repairs to worn or loose carpet promptly.  Avoid placing throw rugs at the top or bottom of stairways, or properly secure the rug with carpet tape to prevent slippage. Get rid of throw rugs, if possible.  Have an electrician put in a light switch at the top and bottom of the stairs. OTHER FALL PREVENTION TIPS  Wear low-heel or rubber-soled shoes that are supportive and fit well. Wear closed toe shoes.  When using a stepladder, make sure it is fully opened and both spreaders are firmly locked. Do not climb a closed stepladder.  Add color or contrast paint or tape to grab bars and handrails in your home. Place contrasting color strips on first and last steps.  Learn and use mobility aids as needed. Install an   electrical emergency response system.  Turn on lights to avoid dark areas. Replace light bulbs that burn out immediately. Get light switches that glow.  Arrange furniture to create clear pathways. Keep furniture in the same place.  Firmly attach carpet with non-skid or double-sided tape.  Eliminate uneven floor surfaces.  Select a carpet pattern that does not visually hide the edge of steps.  Be aware of all pets. OTHER HOME SAFETY TIPS  Set the water temperature for 120 F (48.8 C).  Keep emergency numbers on or near the telephone.  Keep smoke detectors on every level of the home and near sleeping  areas. Document Released: 12/14/2001 Document Revised: 06/25/2011 Document Reviewed: 03/15/2011 ExitCare Patient Information 2015 [employer]. This information is not intended to replace advice given to you by your health care provider. Make sure you discuss any questions you have with your health care provider.    Preventive Care for Adults    Ages 65 and over  Blood pressure check.** / Every 1 to 2 years.  Lipid and cholesterol check.**/ Every 5 years beginning at age 20.  Lung cancer screening. / Every year if you are aged 55-80 years and have a 30-pack-year history of smoking and currently smoke or have quit within the past 15 years. Yearly screening is stopped once you have quit smoking for at least 15 years or develop a health problem that would prevent you from having lung cancer treatment.  Fecal occult blood test (FOBT) of stool. / Every year beginning at age 50 and continuing until age 75. You may not have to do this test if you get a colonoscopy every 10 years.  Flexible sigmoidoscopy** or colonoscopy.** / Every 5 years for a flexible sigmoidoscopy or every 10 years for a colonoscopy beginning at age 50 and continuing until age 75.  Hepatitis C blood test.** / For all people born from 1945 through 1965 and any individual with known risks for hepatitis C.  Abdominal aortic aneurysm (AAA) screening.** / A one-time screening for ages 65 to 75 years who are current or former smokers.  Skin self-exam. / Monthly.  Influenza vaccine. / Every year.  Tetanus, diphtheria, and acellular pertussis (Tdap/Td) vaccine.** / 1 dose of Td every 10 years.  Varicella vaccine.** / Consult your health care provider.  Zoster vaccine.** / 1 dose for adults aged 60 years or older.  Pneumococcal 13-valent conjugate (PCV13) vaccine.** / Consult your health care provider.  Pneumococcal polysaccharide (PPSV23) vaccine.** / 1 dose for all adults aged 65 years and older.  Meningococcal  vaccine.** / Consult your health care provider.  Hepatitis A vaccine.** / Consult your health care provider.  Hepatitis B vaccine.** / Consult your health care provider.  Haemophilus influenzae type b (Hib) vaccine.** / Consult your health care provider. **Family history and personal history of risk and conditions may change your health care provider's recommendations. Document Released: 02/19/2001 Document Revised: 12/29/2012 Document Reviewed: 05/21/2010 ExitCare Patient Information 2015 [employer]. This information is not intended to replace advice given to you by your health care provider. Make sure you discuss any questions you have with your health care provider.  

## 2014-01-20 NOTE — Assessment & Plan Note (Signed)
Td 2009 Zostavax 2013 Pneumonia shot 2013 Prevnar 2015 Had a flu shot  Colonoscopy 2002, diverticuli, random biopsies were done. Flex sigmoidoscopy 2006, + hemorrhoids, Dr. Deatra Ina Patient states he is due for a colonoscopy but at this point he will not proceed due to cost Diet and exercise discussed

## 2014-01-20 NOTE — Progress Notes (Signed)
Subjective:    Patient ID: Christian Roberts, male    DOB: Jun 05, 1945, 69 y.o.   MRN: [id number]  DOS:  01/20/2014 Type of visit - description :  Here for Medicare AWV: 1. Risk factors based on Past M, S, F history: reviewed 2. Physical Activities:  Active , woodwork, no gym-regular exercise  3. Depression/mood: neg screening  4. Hearing: some decrease hearing,chronic tinnitus, no worse, offered 5. ADL's:  Independent, drives  6. Fall Risk: had a fall related to his dog pulling, precautions discussed  7. home Safety: does feel safe at home  8. Height, weight, & visual acuity: see VS, vision decreased, to see eye md next month 9. Counseling: provided 10. Labs ordered based on risk factors: if needed  11. Referral Coordination: if needed 12. Care Plan, see assessment and plan  13. Cognitive Assessment: 14. Care team updated 15. Personalized plan provided,  see instructions  In addition, today we discussed the following: DJD, pain continued to be a challenge, we talk about strategies to exercise under the circumstances On gabapentin for a history of neck pain with radiculopathy Diabetes, on metformin, good compliance, CBGs ranged from 99-280. CBGs were elevated more when he was taking prednisone . High cholesterol, good compliance with simvastatin. Increased PSA, recently had prostate MRI, see assessment and plan.   ROS Denies chest or difficulty breathing No nausea, vomiting, diarrhea. Occasional red blood per rectum, thinks related to hemorrhoids Currently without cough or sputum production  Past Medical History  Diagnosis Date  . Allergic rhinitis   . GERD (gastroesophageal reflux disease)   . Hyperlipidemia   . Hernia   . Diabetes mellitus     diet controlled - since 2012  . Headache(784.0)     headaches-related to sinuses & uses Tylenol   . Skin cancer, basal cell     sees derm  . Osteoarthritis     ankle- left, hands & neck & hip  . Complication of anesthesia   .  PONV (postoperative nausea and vomiting)     WITH ETHER  . Asthma     "I outgrew it"  . Numbness of fingers   . Constipation   . Nocturia     Past Surgical History  Procedure Laterality Date  . Ankle surgery Left   . Esophagogastroduodenoscopy  06/27/2006  . Tonsillectomy    . Varicocelectomy    . Left ankle mass removed  1972    3 surgeries on L ankle- 73, 2008, 2009  . Neck surgery  03/11,     C3-C7  . Back surgery      cerv. fusion C1- T1, 2 separate surgeries , T1-T3 2014  . Appendectomy      1964  . Cystoscopy      multiple times for infections  . Inguinal hernia repair  03/27/2011    Procedure: HERNIA REPAIR INGUINAL ADULT;  Surgeon: Harl Bowie, MD;  Location: [facility];  Service: General;  Laterality: Left;  . Carpal tunnel release Right   . Rectal surgery      "BORN WITHOUT A RECTUM" SURGICAL CORRECTION  . Shoulder surgery  09/17/12  . Inguinal hernia repair Bilateral 12/09/2012    Procedure: LAPAROSCOPIC BILATERAL INGUINAL HERNIA REPAIR WITH MESH;  Surgeon: Harl Bowie, MD;  Location: [facility];  Service: General;  Laterality: Bilateral;  . Insertion of mesh Bilateral 12/09/2012    Procedure: INSERTION OF MESH;  Surgeon: Harl Bowie, MD;  Location: [facility];  Service: General;  Laterality: Bilateral;  . Ankle fusion Left 03-19-13    Dr Mardelle Matte    History   Social History  . Marital Status: Married    Spouse Name: N/A    Number of Children: 3  . Years of Education: N/A   Occupational History  . 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 Smoking status: Current Every Day Smoker    Types: Cigarettes    Last Attempt to Quit: 04/10/2012  . Smokeless tobacco: Never Used     Comment: quit on-off since 2014   . Alcohol Use: No  . Drug Use: No  . Sexual Activity: Not on file   Other Topics Concern  . Not on file   Social History Narrative   Married , lives w/ wife     Family History  Problem Relation Age of Onset  . Stroke  Mother   . Anesthesia problems Mother   . Cerebral aneurysm Father   . Skin cancer Father     melanoma  . Diabetes Sister     hypoglycemia  . Breast cancer Sister     x 2  . Colon cancer Neg Hx   . Lung cancer Sister   . Prostate cancer Neg Hx   . Emphysema Sister        Medication List       This list is accurate as of: 01/20/14 10:14 AM.  Always use your most recent med list.               acetaminophen 500 MG tablet  Commonly known as:  TYLENOL  Take 500-1,000 mg by mouth every 6 (six) hours as needed for pain. For pain     DOCUSATE SODIUM PO  Take by mouth.     doxycycline 100 MG tablet  Commonly known as:  VIBRA-TABS  Take 1 tablet (100 mg total) by mouth 2 (two) times daily.     esomeprazole 10 MG packet  Commonly known as:  NEXIUM  Take 10 mg by mouth daily before breakfast.     famotidine 10 MG tablet  Commonly known as:  PEPCID  Take 10 mg by mouth daily.     fish oil-omega-3 fatty acids 1000 MG capsule  Take 1 g by mouth 2 (two) times daily.     folic acid 1 MG tablet  Commonly known as:  FOLVITE  Take 1 mg by mouth daily.     gabapentin 300 MG capsule  Commonly known as:  NEURONTIN  Take 300 mg by mouth 5 (five) times daily.     GAS-X PO  Take by mouth.     glucose blood test strip  Commonly known as:  ONETOUCH VERIO  1 each by Other route 3 (three) times daily. Use as instructed     lidocaine 5 %  Commonly known as:  LIDODERM     metFORMIN 500 MG tablet  Commonly known as:  GLUCOPHAGE  TAKE 1 TWICE DAILY WITH MEAL     MYRBETRIQ 50 MG Tb24 tablet  Generic drug:  mirabegron ER     ONETOUCH DELICA LANCETS FINE Misc  1 each by Does not apply route 3 (three) times daily.     polycarbophil 625 MG tablet  Commonly known as:  FIBERCON  Take 625 mg by mouth daily.     pseudoephedrine 30 MG tablet  Commonly known as:  SUDAFED  Take 30 mg by mouth every 4 (four) hours as needed for congestion. For  congestion     simvastatin 20 MG  tablet  Commonly known as:  ZOCOR  Take 1 tablet (20 mg total) by mouth at bedtime.     tamsulosin 0.4 MG Caps capsule  Commonly known as:  FLOMAX  Take 0.4 mg by mouth daily after supper.     ZIMS MAX-FREEZE 3.7 % Gel  Generic drug:  Menthol (Topical Analgesic)  Apply topically.           Objective:   Physical Exam BP 133/70 mmHg  Pulse 97  Temp(Src) 97.6 F (36.4 C) (Oral)  Ht 5\' 4"  (1.626 m)  Wt 132 lb 4 oz (59.988 kg)  BMI 22.69 kg/m2  SpO2 99%  General -- alert, well-developed, NAD.  Neck --no thyromegaly , decreased carotid pulses? No bruit HEENT-- Not pale.  Lungs -- normal respiratory effort, no intercostal retractions, no accessory muscle use, and normal breath sounds.  Heart-- normal rate, regular rhythm, no murmur.  Abdomen-- Not distended, good bowel sounds,soft, non-tender. No BRUIT Extremities-- no pretibial edema bilaterally  Neurologic--  alert & oriented X3. Speech normal, gait appropriate for age, strength symmetric and appropriate for age.  Psych-- Cognition and judgment appear intact. Cooperative with normal attention span and concentration. No anxious or depressed appearing.     Assessment & Plan:

## 2014-01-20 NOTE — Assessment & Plan Note (Signed)
Last A1c 6.9, currently metformin 500 twice a day, check labs

## 2014-01-20 NOTE — Assessment & Plan Note (Signed)
Recently PSA was elevated, had 2 biopsies before, MRI was done, he was subsequently prescribed observation

## 2014-01-20 NOTE — Assessment & Plan Note (Signed)
On simvastatin, no change

## 2014-01-21 ENCOUNTER — Telehealth: Payer: Self-pay | Admitting: Internal Medicine

## 2014-01-21 NOTE — Telephone Encounter (Signed)
emmi emailed °

## 2014-01-24 MED ORDER — METFORMIN HCL 1000 MG PO TABS: 1000.0000 mg | ORAL_TABLET | Freq: Two times a day (BID) | ORAL | Status: DC

## 2014-01-24 NOTE — Addendum Note (Signed)
Addended by: Wilfrid Lund on: 01/24/2014 04:38 PM   Modules accepted: Orders, Medications

## 2014-02-11 ENCOUNTER — Other Ambulatory Visit: Payer: Self-pay | Admitting: Internal Medicine

## 2014-03-09 ENCOUNTER — Encounter: Payer: Self-pay | Admitting: Internal Medicine

## 2014-03-09 ENCOUNTER — Ambulatory Visit (INDEPENDENT_AMBULATORY_CARE_PROVIDER_SITE_OTHER): Payer: BLUE CROSS/BLUE SHIELD | Admitting: Internal Medicine

## 2014-03-09 VITALS — BP 124/66 | HR 101 | Temp 98.0°F | Wt 133.2 lb

## 2014-03-09 DIAGNOSIS — R202 Paresthesia of skin: Secondary | ICD-10-CM

## 2014-03-09 DIAGNOSIS — M069 Rheumatoid arthritis, unspecified: Secondary | ICD-10-CM

## 2014-03-09 DIAGNOSIS — L989 Disorder of the skin and subcutaneous tissue, unspecified: Secondary | ICD-10-CM

## 2014-03-09 DIAGNOSIS — E119 Type 2 diabetes mellitus without complications: Secondary | ICD-10-CM

## 2014-03-09 DIAGNOSIS — M542 Cervicalgia: Secondary | ICD-10-CM

## 2014-03-09 LAB — VITAMIN B12: VITAMIN B 12: 438 pg/mL (ref 211–911)

## 2014-03-09 NOTE — Patient Instructions (Signed)
Get your blood work before you leave   Please go to the front and schedule a visit  In 3 months for a checkup

## 2014-03-09 NOTE — Assessment & Plan Note (Addendum)
Went to see Dr. Rozanna Box, neurosurgery at [facility], due to persisting neck pain and paresthesias, MRI was done, he was not recommend surgery rather pain management. He also suggested check a B12 and B6, will proceed with those labs and fax them to him

## 2014-03-09 NOTE — Assessment & Plan Note (Signed)
On  methotrexate 20 mg parenteral weekly  Started methotrexate 10-2013

## 2014-03-09 NOTE — Progress Notes (Signed)
Pre visit review using our clinic review tool, if applicable. No additional management support is needed unless otherwise documented below in the visit note. 

## 2014-03-09 NOTE — Progress Notes (Signed)
Subjective:    Patient ID: Christian Roberts, male    DOB: 19-Feb-1945, 69 y.o.   MRN: [id number]  DOS:  03/09/2014 Type of visit - description : f/u Interval history: Went to neurosurgery regards neck pain, MRI was done, they did not recommend surgery rather check a B12, B6. Rheumatoid Arth, on methotrexate Diabetes, good compliance with increased dose of metformin, CBGs were elevated when he was taking high doses of prednisone but he is now off prednisone.   Also on and off pain around the left eye for 3 weeks, sometimes intense, decrease when he takes Sudafed.   Review of Systems  Denies fever chills No chest pain or difficulty breathing No rash in the face Patient remains unchanged in the last 3 weeks.  Past Medical History  Diagnosis Date  . Allergic rhinitis   . GERD (gastroesophageal reflux disease)   . Hyperlipidemia   . Hernia   . Diabetes mellitus     2012  . Headache(784.0)     headaches-related to sinuses & uses Tylenol   . Skin cancer, basal cell     sees derm  . Osteoarthritis     ankle- left, hands & neck & hip  . Complication of anesthesia   . PONV (postoperative nausea and vomiting)     WITH ETHER  . Asthma     "I outgrew it"  . Numbness of fingers   . Constipation   . Nocturia   . Chronic rheumatic arthritis 01/20/2014    Per Dr. Amil Amen     Past Surgical History  Procedure Laterality Date  . Ankle surgery Left   . Esophagogastroduodenoscopy    . Tonsillectomy    . Varicocelectomy    . Left ankle mass removed  1972    3 surgeries on L ankle- 73, 2008, 2009  . Neck surgery  03/11,     C3-C7  . Back surgery      cerv. fusion C1- T1, 2 separate surgeries , T1-T3 2014  . Appendectomy      1964  . Cystoscopy      multiple times for infections  . Inguinal hernia repair  03/27/2011    Procedure: HERNIA REPAIR INGUINAL ADULT;  Surgeon: Harl Bowie, MD;  Location: [place];  Service: General;  Laterality: Left;  . Carpal tunnel release Right     . Rectal surgery  at birth    "[place]" SURGICAL CORRECTION  . Shoulder surgery  09/17/12  . Inguinal hernia repair Bilateral 12/09/2012    Procedure: LAPAROSCOPIC BILATERAL INGUINAL HERNIA REPAIR WITH MESH;  Surgeon: Harl Bowie, MD;  Location: [facility];  Service: General;  Laterality: Bilateral;  . Insertion of mesh Bilateral 12/09/2012    Procedure: INSERTION OF MESH;  Surgeon: Harl Bowie, MD;  Location: [facility];  Service: General;  Laterality: Bilateral;  . Ankle fusion Left 03-19-13    Dr Mardelle Matte    History   Social History  . Marital Status: Married    Spouse Name: N/A  . Number of Children: 3  . Years of Education: N/A   Occupational History  . 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 Smoking status: Current Every Day Smoker    Types: Cigarettes    Last Attempt to Quit: 04/10/2012  . Smokeless tobacco: Never Used     Comment: quit on-off since 2014   . Alcohol Use: No  . Drug Use: No  . Sexual Activity:  Not on file   Other Topics Concern  . Not on file   Social History Narrative   Married , lives w/ wife        Medication List       This list is accurate as of: 03/09/14 11:59 PM.  Always use your most recent med list.               acetaminophen 500 MG tablet  Commonly known as:  TYLENOL  Take 500-1,000 mg by mouth every 6 (six) hours as needed for pain. For pain     DOCUSATE SODIUM PO  Take by mouth.     esomeprazole 10 MG packet  Commonly known as:  NEXIUM  Take 10 mg by mouth daily before breakfast.     fish oil-omega-3 fatty acids 1000 MG capsule  Take 1 g by mouth 2 (two) times daily.     folic acid 1 MG tablet  Commonly known as:  FOLVITE  Take 1 mg by mouth daily.     gabapentin 300 MG capsule  Commonly known as:  NEURONTIN  Take 300 mg by mouth 5 (five) times daily.     GAS-X PO  Take by mouth.     glucose blood test strip  Commonly known as:  ONETOUCH VERIO  1 each by Other route 3 (three)  times daily. Use as instructed     lidocaine 5 %  Commonly known as:  LIDODERM     metFORMIN 1000 MG tablet  Commonly known as:  GLUCOPHAGE  Take 1 tablet (1,000 mg total) by mouth 2 (two) times daily with a meal.     Methotrexate (PF) 20 MG/0.4ML Soaj  Inject into the skin. Once a week     MYRBETRIQ 50 MG Tb24 tablet  Generic drug:  mirabegron ER     ONETOUCH DELICA LANCETS FINE Misc  1 each by Does not apply route 3 (three) times daily.     polycarbophil 625 MG tablet  Commonly known as:  FIBERCON  Take 625 mg by mouth daily.     simvastatin 20 MG tablet  Commonly known as:  ZOCOR  Take 1 tablet (20 mg total) by mouth at bedtime.     tamsulosin 0.4 MG Caps capsule  Commonly known as:  FLOMAX  Take 0.4 mg by mouth daily after supper.     ZIMS MAX-FREEZE 3.7 % Gel  Generic drug:  Menthol (Topical Analgesic)  Apply topically.           Objective:   Physical Exam BP 124/66 mmHg  Pulse 101  Temp(Src) 98 F (36.7 C) (Oral)  Wt 133 lb 4 oz (60.442 kg)  SpO2 97% General:   Well developed, well nourished . NAD.  HEENT:  Normocephalic . Face symmetric, atraumatic, sinuses not TTP Eyes: EOMI Pupils equal and reactive to light Conjunctiva: No redness or discharge Funduscopy difficult due to cataracts Muscle skeletal: no pretibial edema bilaterally  Skin: Not pale. Not jaundice Neurologic:  alert & oriented X3.  Speech normal, gait appropriate for age and unassisted Psych--  Cognition and judgment appear intact.  Cooperative with normal attention span and concentration.  Behavior appropriate. No anxious or depressed appearing.    Assessment & Plan:    Pain around the right eye, Exam is negative, I recommend to see the ophthalmologist, he is reluctant because he went there few weeks ago and exam was negative.  We agreed to observation for now but if these continue for more than  1-2 weeks he will call me or go to ophthalmology

## 2014-03-09 NOTE — Assessment & Plan Note (Signed)
Has developed other scalp lesions and cognition on the left hand, refer back to dermatology Dr. Melina Copa

## 2014-03-09 NOTE — Assessment & Plan Note (Addendum)
Based on the last A1c, metformin dose increased, good compliance and tolerance. CBGs were elevated when he was taking a high dose of prednisone. Off prednisone now, continue with present care, check a A1c on return to the office

## 2014-03-11 ENCOUNTER — Other Ambulatory Visit: Payer: Self-pay | Admitting: *Deleted

## 2014-03-11 ENCOUNTER — Other Ambulatory Visit: Payer: BLUE CROSS/BLUE SHIELD

## 2014-03-11 DIAGNOSIS — R202 Paresthesia of skin: Secondary | ICD-10-CM

## 2014-03-11 LAB — VITAMIN B6

## 2014-03-15 ENCOUNTER — Encounter: Payer: Self-pay | Admitting: Internal Medicine

## 2014-03-16 LAB — VITAMIN B6: Vitamin B6: 8.7 ng/mL (ref 2.1–21.7)

## 2014-03-22 ENCOUNTER — Encounter: Payer: Self-pay | Admitting: Internal Medicine

## 2014-03-22 ENCOUNTER — Ambulatory Visit (INDEPENDENT_AMBULATORY_CARE_PROVIDER_SITE_OTHER): Payer: BLUE CROSS/BLUE SHIELD | Admitting: Internal Medicine

## 2014-03-22 VITALS — BP 124/76 | HR 92 | Temp 98.0°F | Ht 64.0 in | Wt 133.4 lb

## 2014-03-22 DIAGNOSIS — M7989 Other specified soft tissue disorders: Secondary | ICD-10-CM

## 2014-03-22 NOTE — Patient Instructions (Signed)
Ice the hand twice a day  Keep the hand elevated  Call if the swelling is not improving Call if fever, redness, pain (infection?)

## 2014-03-22 NOTE — Progress Notes (Signed)
Pre visit review using our clinic review tool, if applicable. No additional management support is needed unless otherwise documented below in the visit note. 

## 2014-03-22 NOTE — Progress Notes (Signed)
Subjective:    Patient ID: Christian Roberts, male    DOB: [birth year], 69 y.o.   MRN: [id number]  DOS:  03/22/2014 Type of visit - description : acute Interval history: Did  some yard work 2 days ago, shortly after developed right hand swelling, redness and tenderness. Symptoms located around the knuckles, does not recall if the area was warm. He took some Benadryl, symptoms are already better.   Review of Systems Denies any injury, bleeding, cut per se  No fever, some chills? The patient has diabetes, ambulatory blood sugars around 120s  Past Medical History  Diagnosis Date  . Allergic rhinitis   . GERD (gastroesophageal reflux disease)   . Hyperlipidemia   . Hernia   . Diabetes mellitus     2012  . Headache(784.0)     headaches-related to sinuses & uses Tylenol   . Skin cancer, basal cell     sees derm  . Osteoarthritis     ankle- left, hands & neck & hip  . Complication of anesthesia   . PONV (postoperative nausea and vomiting)     WITH ETHER  . Asthma     "I outgrew it"  . Numbness of fingers   . Constipation   . Nocturia   . Chronic rheumatic arthritis 01/20/2014    Per Dr. Amil Amen     Past Surgical History  Procedure Laterality Date  . Ankle surgery Left   . Esophagogastroduodenoscopy    . Tonsillectomy    . Varicocelectomy    . Left ankle mass removed  1972    3 surgeries on L ankle- 73, 2008, 2009  . Neck surgery  03/11,     C3-C7  . Back surgery      cerv. fusion C1- T1, 2 separate surgeries , T1-T3 2014  . Appendectomy      1964  . Cystoscopy      multiple times for infections  . Inguinal hernia repair  03/27/2011    Procedure: HERNIA REPAIR INGUINAL ADULT;  Surgeon: Harl Bowie, MD;  Location: [place];  Service: General;  Laterality: Left;  . Carpal tunnel release Right   . Rectal surgery  at birth    "[place]" SURGICAL CORRECTION  . Shoulder surgery  09/17/12  . Inguinal hernia repair Bilateral 12/09/2012    Procedure:  LAPAROSCOPIC BILATERAL INGUINAL HERNIA REPAIR WITH MESH;  Surgeon: Harl Bowie, MD;  Location: [facility];  Service: General;  Laterality: Bilateral;  . Insertion of mesh Bilateral 12/09/2012    Procedure: INSERTION OF MESH;  Surgeon: Harl Bowie, MD;  Location[facility];  Service: General;  Laterality: Bilateral;  . Ankle fusion Left 03-19-13    Dr Mardelle Matte    History   Social History  . Marital Status: Married    Spouse Name: N/A  . Number of Children: 3  . Years of Education: N/A   Occupational History  . 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 Smoking status: Current Every Day Smoker    Types: Cigarettes    Last Attempt to Quit: 04/10/2012  . Smokeless tobacco: Never Used     Comment: quit on-off since 2014   . Alcohol Use: No  . Drug Use: No  . Sexual Activity: Not on file   Other Topics Concern  . Not on file   Social History Narrative   Married , lives w/ wife        Medication List  This list is accurate as of: 03/22/14  8:58 PM.  Always use your most recent med list.               acetaminophen 500 MG tablet  Commonly known as:  TYLENOL  Take 500-1,000 mg by mouth every 6 (six) hours as needed for pain. For pain     DOCUSATE SODIUM PO  Take by mouth.     esomeprazole 10 MG packet  Commonly known as:  NEXIUM  Take 10 mg by mouth daily before breakfast.     fish oil-omega-3 fatty acids 1000 MG capsule  Take 1 g by mouth 2 (two) times daily.     folic acid 1 MG tablet  Commonly known as:  FOLVITE  Take 1 mg by mouth daily.     gabapentin 300 MG capsule  Commonly known as:  NEURONTIN  Take 300 mg by mouth 5 (five) times daily.     GAS-X PO  Take by mouth.     glucose blood test strip  Commonly known as:  ONETOUCH VERIO  1 each by Other route 3 (three) times daily. Use as instructed     lidocaine 5 %  Commonly known as:  LIDODERM     metFORMIN 1000 MG tablet  Commonly known as:  GLUCOPHAGE  Take 1 tablet  (1,000 mg total) by mouth 2 (two) times daily with a meal.     Methotrexate (PF) 20 MG/0.4ML Soaj  Inject into the skin. Once a week     MYRBETRIQ 50 MG Tb24 tablet  Generic drug:  mirabegron ER     ONETOUCH DELICA LANCETS FINE Misc  1 each by Does not apply route 3 (three) times daily.     polycarbophil 625 MG tablet  Commonly known as:  FIBERCON  Take 625 mg by mouth daily.     simvastatin 20 MG tablet  Commonly known as:  ZOCOR  Take 1 tablet (20 mg total) by mouth at bedtime.     tamsulosin 0.4 MG Caps capsule  Commonly known as:  FLOMAX  Take 0.4 mg by mouth daily after supper.     ZIMS MAX-FREEZE 3.7 % Gel  Generic drug:  Menthol (Topical Analgesic)  Apply topically.           Objective:   Physical Exam  Musculoskeletal:       Arms:  BP 124/76 mmHg  Pulse 92  Temp(Src) 98 F (36.7 C) (Oral)  Ht 5\' 4"  (1.626 m)  Wt 133 lb 6 oz (60.499 kg)  BMI 22.88 kg/m2  SpO2 100%  General:   Well developed, well nourished . NAD.  HEENT:  Normocephalic . Face symmetric, atraumatic Hands +visible swelling close to the second and third knuckle, see graphic. Fingers are minimally swollen, wrist is normal. There is no redness or warmness, mild TTP and the area of swelling. Range of motion is in the fingers slightly decreased likely due to the mild swelling  Neurologic:  alert & oriented X3.  Speech normal, gait appropriate for age and unassisted Psych--  Cognition and judgment appear intact.  Cooperative with normal attention span and concentration.  Behavior appropriate. No anxious or depressed appearing.       Assessment & Plan:   Had swelling, Develop have swelling after working in the yard, symptoms are getting better without major intervention except Benadryl. Suspect either an allergic reaction or overuse syndrome, doubt infection as the symptoms are resolving. Plan: Ice, and elevation, rest.  If he swells  up again, will consider a low dose of prednisone  although he has taken prednisone freq in the recent past. If develop redness or fever will consider antibiotics.

## 2014-03-25 ENCOUNTER — Telehealth: Payer: Self-pay | Admitting: Internal Medicine

## 2014-03-25 MED ORDER — PREDNISONE 20 MG PO TABS: ORAL_TABLET | ORAL | Status: DC

## 2014-03-25 NOTE — Telephone Encounter (Signed)
FYI. Please advise.

## 2014-03-25 NOTE — Telephone Encounter (Signed)
Recommend prednisone 20 mg one by mouth daily 5 days, call a prescription if needed, I believe he has some prednisone at home. Watch CBGs while on prednisone, discontinue it if sugars more than 200

## 2014-03-25 NOTE — Telephone Encounter (Signed)
LMOM informing Pt of Dr. Larose Kells recommendations, informed him to watch blood sugars and to d/c if sugars are more than 200. Informed him that Rx was sent to [facility] as requested, and to call if he has any other questions.

## 2014-03-25 NOTE — Telephone Encounter (Signed)
Caller name: Iliya Relation to pt: self Call back number: [phone] Pharmacy: [facility] on [address]  Reason for call:    Patient states that his hand is still swollen and was told to call back if the swelling did not go down

## 2014-03-28 ENCOUNTER — Telehealth: Payer: Self-pay | Admitting: Internal Medicine

## 2014-03-29 ENCOUNTER — Encounter: Payer: Self-pay | Admitting: Internal Medicine

## 2014-03-29 ENCOUNTER — Ambulatory Visit (INDEPENDENT_AMBULATORY_CARE_PROVIDER_SITE_OTHER): Payer: BLUE CROSS/BLUE SHIELD | Admitting: Internal Medicine

## 2014-03-29 VITALS — BP 142/82 | HR 102 | Temp 98.3°F | Ht 64.0 in | Wt 129.2 lb

## 2014-03-29 DIAGNOSIS — J9801 Acute bronchospasm: Secondary | ICD-10-CM

## 2014-03-29 DIAGNOSIS — R51 Headache: Secondary | ICD-10-CM | POA: Diagnosis not present

## 2014-03-29 DIAGNOSIS — R519 Headache, unspecified: Secondary | ICD-10-CM

## 2014-03-29 MED ORDER — AMOXICILLIN 500 MG PO CAPS: 1000.0000 mg | ORAL_CAPSULE | Freq: Two times a day (BID) | ORAL | Status: DC

## 2014-03-29 MED ORDER — ALBUTEROL SULFATE HFA 108 (90 BASE) MCG/ACT IN AERS: 2.0000 | INHALATION_SPRAY | Freq: Four times a day (QID) | RESPIRATORY_TRACT | Status: AC | PRN

## 2014-03-29 NOTE — Progress Notes (Signed)
Subjective:    Patient ID: Christian Roberts, male    DOB: [birth year], 69 y.o.   MRN: [id number]  DOS:  03/29/2014 Type of visit - description : acute, has 2 problems Interval history: 3 weeks ago noted cough on and off,  Wheezing and chest congestion. Wheezing is gone, the rest of the symptoms are still there. Last time he had asthma was many years ago. Took some Sudafed allergy yesterday, Benadryl today  Also, having a left-sided headache, for the last 2 months, increasing in frequency and intensity, sometimes "violent". Occasional mild nausea, no vomiting, + photophobia, no problems with noises.    Review of Systems Denies fevers, mild chills. Mild sinus pain and congestion with very little nasal discharge Some cough with very little sputum. Neck pain at baseline.   Past Medical History  Diagnosis Date  . Allergic rhinitis   . GERD (gastroesophageal reflux disease)   . Hyperlipidemia   . Hernia   . Diabetes mellitus     2012  . Headache(784.0)     headaches-related to sinuses & uses Tylenol   . Skin cancer, basal cell     sees derm  . Osteoarthritis     ankle- left, hands & neck & hip  . Complication of anesthesia   . PONV (postoperative nausea and vomiting)     WITH ETHER  . Asthma     "I outgrew it"  . Numbness of fingers   . Constipation   . Nocturia   . Chronic rheumatic arthritis 01/20/2014    Per Dr. Amil Amen     Past Surgical History  Procedure Laterality Date  . Ankle surgery Left   . Esophagogastroduodenoscopy    . Tonsillectomy    . Varicocelectomy    . Left ankle mass removed  1972    3 surgeries on L ankle- 73, 2008, 2009  . Neck surgery  03/11,     C3-C7  . Back surgery      cerv. fusion C1- T1, 2 separate surgeries , T1-T3 2014  . Appendectomy      1964  . Cystoscopy      multiple times for infections  . Inguinal hernia repair  03/27/2011    Procedure: HERNIA REPAIR INGUINAL ADULT;  Surgeon: Harl Bowie, MD;  Location: [facility];   Service: General;  Laterality: Left;  . Carpal tunnel release Right   . Rectal surgery  at birth    "[place]" SURGICAL CORRECTION  . Shoulder surgery  09/17/12  . Inguinal hernia repair Bilateral 12/09/2012    Procedure: LAPAROSCOPIC BILATERAL INGUINAL HERNIA REPAIR WITH MESH;  Surgeon: Harl Bowie, MD;  Location: [facility];  Service: General;  Laterality: Bilateral;  . Insertion of mesh Bilateral 12/09/2012    Procedure: INSERTION OF MESH;  Surgeon: Harl Bowie, MD;  Location: [facility];  Service: General;  Laterality: Bilateral;  . Ankle fusion Left 03-19-13    Dr Mardelle Matte    History   Social History  . Marital Status: Married    Spouse Name: N/A  . Number of Children: 3  . Years of Education: N/A   Occupational History  . 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 Smoking status: Current Every Day Smoker    Types: Cigarettes    Last Attempt to Quit: 04/10/2012  . Smokeless tobacco: Never Used     Comment: quit on-off since 2014   . Alcohol Use: No  . Drug Use:  No  . Sexual Activity: Not on file   Other Topics Concern  . Not on file   Social History Narrative   Married , lives w/ wife        Medication List       This list is accurate as of: 03/29/14 11:59 PM.  Always use your most recent med list.               acetaminophen 500 MG tablet  Commonly known as:  TYLENOL  Take 500-1,000 mg by mouth every 6 (six) hours as needed for pain. For pain     albuterol 108 (90 BASE) MCG/ACT inhaler  Commonly known as:  VENTOLIN HFA  Inhale 2 puffs into the lungs every 6 (six) hours as needed for wheezing or shortness of breath.     amoxicillin 500 MG capsule  Commonly known as:  AMOXIL  Take 2 capsules (1,000 mg total) by mouth 2 (two) times daily.     DOCUSATE SODIUM PO  Take by mouth.     esomeprazole 10 MG packet  Commonly known as:  NEXIUM  Take 10 mg by mouth daily before breakfast.     fish oil-omega-3 fatty acids 1000 MG  capsule  Take 1 g by mouth 2 (two) times daily.     folic acid 1 MG tablet  Commonly known as:  FOLVITE  Take 1 mg by mouth daily.     gabapentin 300 MG capsule  Commonly known as:  NEURONTIN  Take 300 mg by mouth 5 (five) times daily.     GAS-X PO  Take by mouth.     glucose blood test strip  Commonly known as:  ONETOUCH VERIO  1 each by Other route 3 (three) times daily. Use as instructed     lidocaine 5 %  Commonly known as:  LIDODERM     metFORMIN 1000 MG tablet  Commonly known as:  GLUCOPHAGE  Take 1 tablet (1,000 mg total) by mouth 2 (two) times daily with a meal.     Methotrexate (PF) 20 MG/0.4ML Soaj  Inject into the skin. Once a week     MYRBETRIQ 50 MG Tb24 tablet  Generic drug:  mirabegron ER     ONETOUCH DELICA LANCETS FINE Misc  1 each by Does not apply route 3 (three) times daily.     polycarbophil 625 MG tablet  Commonly known as:  FIBERCON  Take 625 mg by mouth daily.     predniSONE 20 MG tablet  Commonly known as:  DELTASONE  Take 1 tablet by mouth for 5 days     simvastatin 20 MG tablet  Commonly known as:  ZOCOR  Take 1 tablet (20 mg total) by mouth at bedtime.     tamsulosin 0.4 MG Caps capsule  Commonly known as:  FLOMAX  Take 0.4 mg by mouth daily after supper.     ZIMS MAX-FREEZE 3.7 % Gel  Generic drug:  Menthol (Topical Analgesic)  Apply topically.           Objective:   Physical Exam BP 142/82 mmHg  Pulse 102  Temp(Src) 98.3 F (36.8 C) (Oral)  Ht 5\' 4"  (1.626 m)  Wt 129 lb 4 oz (58.627 kg)  BMI 22.17 kg/m2  SpO2 96% General:   Well developed, well nourished . NAD.  HEENT:  Normocephalic . Face symmetric, atraumatic; left sinus is slightly TTP Eyes: EOMI Pupils equal and reactive to light Conjunctiva: No redness or discharge Ears: Right ear:  Canal normal, TM normal Left ear: Canal normal, TM  slightly bulge, pink. No discharge Nose:  congested Throat-mouth:  no redness, discharge. Uvula midline. Lungs:  CTA  B Normal respiratory effort, no intercostal retractions, no accessory muscle use. Heart: tachy,  no murmur.  Muscle skeletal: no pretibial edema bilaterally  Skin: Not pale. Not jaundice Neurologic:  alert & oriented X3.  Speech normal, gait appropriate for age and unassisted Psych--  Cognition and judgment appear intact.  Cooperative with normal attention span and concentration.  Behavior appropriate. No anxious or depressed appearing.       Assessment & Plan:    URI, bronchospasm. Patient with long history of asthma, had wheezing 3 nights ago, now seems to be doing better. Plan: Mucinex, Flonase, albuterol as needed. Amoxicillin .  Headaches, 69 year old gentleman with essentially a new onset of headaches for the last 2 months. Headaches have some features of migraines (nausea, phonophobia) but also could be sinus related that he is a slightly TTP and the left maxillary sinus. Plan: MRI

## 2014-03-29 NOTE — Patient Instructions (Signed)
Rest, fluids , tylenol take Mucinex DM twice a day as needed   use OTC Nasocort or Flonase : 2 nasal sprays on each side of the nose daily until you feel better   Take the antibiotic as prescribed  (Amoxicillin) Amoxicillin may interfere with MTX, please hold methotrexate for one week.  Will schedule MRI   Call if not gradually better over the next  10 days Call anytime if the symptoms are severe

## 2014-03-29 NOTE — Progress Notes (Signed)
Pre visit review using our clinic review tool, if applicable. No additional management support is needed unless otherwise documented below in the visit note. 

## 2014-03-30 NOTE — Telephone Encounter (Signed)
Error/gd °

## 2014-04-08 ENCOUNTER — Other Ambulatory Visit: Payer: Self-pay

## 2014-04-08 ENCOUNTER — Telehealth: Payer: Self-pay | Admitting: Gastroenterology

## 2014-04-08 MED ORDER — FLORA-Q PO CAPS: 1.0000 | ORAL_CAPSULE | Freq: Every day | ORAL | Status: DC

## 2014-04-08 MED ORDER — HYDROCORTISONE ACETATE 25 MG RE SUPP: 25.0000 mg | Freq: Two times a day (BID) | RECTAL | Status: DC

## 2014-04-08 NOTE — Telephone Encounter (Signed)
Patient is having loose stools from being on ATB's to treat an ear infection. He feels this has "stired up " his hemorrhoids. He is seeing blood when he wipes. He has some pain. Last seen 2014. He is asking for treatment. Please advise

## 2014-04-08 NOTE — Telephone Encounter (Signed)
Advised. Prescriptions to [facility]. Advised patient insurance may not cover the cost.

## 2014-04-08 NOTE — Telephone Encounter (Signed)
Begin Flora Q1 tablet daily for 2 weeks Anusol HC suppositories one daily at bedtime and one every morning for 7 days

## 2014-04-09 ENCOUNTER — Ambulatory Visit (HOSPITAL_BASED_OUTPATIENT_CLINIC_OR_DEPARTMENT_OTHER)
Admission: RE | Admit: 2014-04-09 | Discharge: 2014-04-09 | Disposition: A | Discharge status: Home / Self Care | Payer: BLUE CROSS/BLUE SHIELD | Source: Ambulatory Visit | Attending: Internal Medicine | Admitting: Internal Medicine

## 2014-04-09 DIAGNOSIS — M47892 Other spondylosis, cervical region: Secondary | ICD-10-CM | POA: Diagnosis not present

## 2014-04-09 DIAGNOSIS — R51 Headache: Secondary | ICD-10-CM | POA: Insufficient documentation

## 2014-04-09 DIAGNOSIS — R519 Headache, unspecified: Secondary | ICD-10-CM

## 2014-04-09 DIAGNOSIS — J9801 Acute bronchospasm: Secondary | ICD-10-CM

## 2014-04-11 ENCOUNTER — Telehealth: Payer: Self-pay | Admitting: Internal Medicine

## 2014-04-11 DIAGNOSIS — R519 Headache, unspecified: Secondary | ICD-10-CM

## 2014-04-11 DIAGNOSIS — R51 Headache: Principal | ICD-10-CM

## 2014-04-11 MED ORDER — AZITHROMYCIN 250 MG PO TABS: ORAL_TABLET | ORAL | Status: DC

## 2014-04-11 NOTE — Telephone Encounter (Signed)
Caller name: Eulas Relation to pt: self Call back number: [phone] Pharmacy:  Reason for call:   Requesting MRI results

## 2014-04-11 NOTE — Telephone Encounter (Signed)
Plan: Discontinue amoxicillin  I sent a prescription for a Z-Pak  arrange neurological eval, the exiting onset of headaches  ER if headache severe

## 2014-04-11 NOTE — Telephone Encounter (Signed)
Spoke with Pt, informed him of MRI results. Pt informed me that headaches have become more severe in the last 24 hours. Informed me that Amoxicillin has "really ripped through my stomach." Also, wanted to inform that his ears still feel clogged and was wanting to know if another round or different antibiotic can be tried. Informed Pt I would speak with Dr. Larose Kells and see what he recommends, Pt verbalized understanding.

## 2014-04-12 ENCOUNTER — Other Ambulatory Visit: Payer: Self-pay

## 2014-04-12 NOTE — Telephone Encounter (Signed)
Spoke with Pt, informed him to d/c amoxicillin and begin Z-pak, Pt informed he picked up Z-pak last night. Informed Pt about Neurology referral, Pt agreed to go. Referral placed. Informed to call or go to ED if headaches become more severe.

## 2014-04-21 ENCOUNTER — Telehealth: Payer: Self-pay | Admitting: Internal Medicine

## 2014-04-21 MED ORDER — ONETOUCH DELICA LANCETS FINE MISC: Status: DC

## 2014-04-21 MED ORDER — GLUCOSE BLOOD VI STRP: ORAL_STRIP | Status: DC

## 2014-04-21 NOTE — Telephone Encounter (Signed)
That is okay, please call neurology and cancel the appointment

## 2014-04-21 NOTE — Telephone Encounter (Signed)
Caller name:Morrical, Bobetta Lime Relation to EX:BMWU Call back number:[phone] Pharmacy:  Reason for call: pt would like to know if dr. Larose Kells feel it is necessary for him to keep his appt with dr. Tomi Likens on 04/27/14, pt states that he has not had a headache since the infection went away, which as been over a week. Please call and advise.

## 2014-04-21 NOTE — Telephone Encounter (Signed)
Christian Roberts Tel [phone] Pt also came in office stating called earlier and for got to ask for [employer] Diab[employer] and [employer] [facility] [employer], pt is running out and informed that in January changed insurance [facility]. Please advise

## 2014-04-21 NOTE — Telephone Encounter (Signed)
FYI. Please advise.

## 2014-04-21 NOTE — Telephone Encounter (Signed)
Test strips and lancets sent to [facility] 

## 2014-04-21 NOTE — Addendum Note (Signed)
Addended by: Wilfrid Lund on: 04/21/2014 03:09 PM   Modules accepted: Orders

## 2014-04-22 ENCOUNTER — Telehealth: Payer: Self-pay | Admitting: *Deleted

## 2014-04-22 ENCOUNTER — Telehealth: Payer: Self-pay | Admitting: Neurology

## 2014-04-22 NOTE — Telephone Encounter (Signed)
Pt dropped off wellness form. Form completed as much as possible and forwarded to Dr. Larose Kells. JG//CMA

## 2014-04-22 NOTE — Telephone Encounter (Signed)
appt canceled, pt aware

## 2014-04-22 NOTE — Telephone Encounter (Signed)
Per Dr. Larose Kells, okay to cancel Neuro appt/referral.

## 2014-04-22 NOTE — Telephone Encounter (Signed)
Dr Larose Kells office called and canceled new patient appt on 04-27-14

## 2014-04-27 ENCOUNTER — Ambulatory Visit: Payer: BLUE CROSS/BLUE SHIELD | Admitting: Neurology

## 2014-04-28 NOTE — Telephone Encounter (Signed)
Wellness form faxed. Sent for scanning. JG//CMA

## 2014-05-05 ENCOUNTER — Other Ambulatory Visit: Payer: Self-pay

## 2014-05-16 ENCOUNTER — Other Ambulatory Visit: Payer: Self-pay

## 2014-06-08 LAB — PSA: PSA: 7.2

## 2014-06-12 ENCOUNTER — Other Ambulatory Visit: Payer: Self-pay | Admitting: Internal Medicine

## 2014-07-05 ENCOUNTER — Ambulatory Visit (INDEPENDENT_AMBULATORY_CARE_PROVIDER_SITE_OTHER): Payer: BLUE CROSS/BLUE SHIELD | Admitting: Internal Medicine

## 2014-07-05 ENCOUNTER — Encounter: Payer: Self-pay | Admitting: Internal Medicine

## 2014-07-05 VITALS — BP 116/64 | HR 68 | Temp 98.1°F | Ht 64.0 in | Wt 130.2 lb

## 2014-07-05 DIAGNOSIS — M79642 Pain in left hand: Secondary | ICD-10-CM | POA: Diagnosis not present

## 2014-07-05 MED ORDER — LIDOCAINE 5 % EX PTCH: 1.0000 | MEDICATED_PATCH | CUTANEOUS | Status: DC

## 2014-07-05 MED ORDER — GABAPENTIN 300 MG PO CAPS: 300.0000 mg | ORAL_CAPSULE | Freq: Every day | ORAL | Status: DC

## 2014-07-05 NOTE — Progress Notes (Signed)
Pre visit review using our clinic review tool, if applicable. No additional management support is needed unless otherwise documented below in the visit note. 

## 2014-07-05 NOTE — Progress Notes (Signed)
Subjective:    Patient ID: Christian Roberts, male    DOB: January 16, 1945, 69 y.o.   MRN: [id number]  DOS:  07/05/2014 Type of visit - description : Acute Interval history: Several weeks history of sharp-burning pain at the left hand, mostly at the thumb, index and one of the sides of the middle finger. The pain is persistent, he thinks he sees bubbles under the skin. He reports a long history of carpal tunnel syndrome on the left, status post surgery on the right. He is using the or CTS on the left but no major relief at this point.   Review of Systems Denies neck pain per se, no actual rash (although he sees bubbles sometimes under the skin, exam today showed no vesicles at all) On the right hand, he does not have the sharp pain but some numbness which is chronic. Does have right shoulder pain, to have surgery by Dr. Mardelle Matte soon.  Past Medical History  Diagnosis Date  . Allergic rhinitis   . GERD (gastroesophageal reflux disease)   . Hyperlipidemia   . Hernia   . Diabetes mellitus     2012  . Headache(784.0)     headaches-related to sinuses & uses Tylenol   . Skin cancer, basal cell     sees derm  . Osteoarthritis     ankle- left, hands & neck & hip  . Complication of anesthesia   . PONV (postoperative nausea and vomiting)     WITH ETHER  . Asthma     "I outgrew it"  . Numbness of fingers   . Constipation   . Nocturia   . Chronic rheumatic arthritis 01/20/2014    Per Dr. Amil Amen   . Complete tear of rotator cuff 2016    Right, MRI in 2016, complete rupture of long head of the biceps as well as a tear of the subscapularis and supraspinatus tendons, Dr. Mardelle Matte    Past Surgical History  Procedure Laterality Date  . Ankle surgery Left   . Esophagogastroduodenoscopy    . Tonsillectomy    . Varicocelectomy    . Left ankle mass removed  1972    3 surgeries on L ankle- 73, 2008, 2009  . Neck surgery  03/11,     C3-C7  . Back surgery      cerv. fusion C1- T1, 2 separate  surgeries , T1-T3 2014  . Appendectomy      1964  . Cystoscopy      multiple times for infections  . Inguinal hernia repair  03/27/2011    Procedure: HERNIA REPAIR INGUINAL ADULT;  Surgeon: Harl Bowie, MD;  Location: [facility];  Service: General;  Laterality: Left;  . Carpal tunnel release Right   . Rectal surgery  at birth    "[place]" SURGICAL CORRECTION  . Shoulder surgery  09/17/12  . Inguinal hernia repair Bilateral 12/09/2012    Procedure: LAPAROSCOPIC BILATERAL INGUINAL HERNIA REPAIR WITH MESH;  Surgeon: Harl Bowie, MD;  Location: [facility];  Service: General;  Laterality: Bilateral;  . Insertion of mesh Bilateral 12/09/2012    Procedure: INSERTION OF MESH;  Surgeon: Harl Bowie, MD;  Location: [facility];  Service: General;  Laterality: Bilateral;  . Ankle fusion Left 03-19-13    Dr Mardelle Matte    History   Social History  . Marital Status: Married    Spouse Name: N/A  . Number of Children: 3  . Years of Education: N/A  Occupational History  . 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 Smoking status: Current Every Day Smoker    Types: Cigarettes    Last Attempt to Quit: 04/10/2012  . Smokeless tobacco: Never Used     Comment: quit on-off since 2014   . Alcohol Use: No  . Drug Use: No  . Sexual Activity: Not on file   Other Topics Concern  . Not on file   Social History Narrative   Married , lives w/ wife        Medication List       This list is accurate as of: 07/05/14 11:59 PM.  Always use your most recent med list.               acetaminophen 500 MG tablet  Commonly known as:  TYLENOL  Take 500-1,000 mg by mouth every 4 (four) hours as needed. For pain     albuterol 108 (90 BASE) MCG/ACT inhaler  Commonly known as:  VENTOLIN HFA  Inhale 2 puffs into the lungs every 6 (six) hours as needed for wheezing or shortness of breath.     DOCUSATE SODIUM PO  Take by mouth.     ENBREL 50 MG/ML injection  Generic drug:   etanercept  Inject 50 mg into the skin once a week.     esomeprazole 10 MG packet  Commonly known as:  NEXIUM  Take 10 mg by mouth daily before breakfast.     fish oil-omega-3 fatty acids 1000 MG capsule  Take 1 g by mouth 2 (two) times daily.     folic acid 1 MG tablet  Commonly known as:  FOLVITE  Take 1 mg by mouth daily.     gabapentin 300 MG capsule  Commonly known as:  NEURONTIN  Take 1 capsule (300 mg total) by mouth 5 (five) times daily.     GAS-X PO  Take by mouth.     glucose blood test strip  Commonly known as:  ONETOUCH VERIO  Check blood sugar 3 times daily.     hydrocortisone 25 MG suppository  Commonly known as:  ANUSOL-HC  Place 1 suppository (25 mg total) rectally every 12 (twelve) hours.     lidocaine 5 %  Commonly known as:  LIDODERM  Place 1 patch onto the skin daily.     metFORMIN 1000 MG tablet  Commonly known as:  GLUCOPHAGE  Take 1 tablet (1,000 mg total) by mouth 2 (two) times daily with a meal.     Methotrexate (PF) 20 MG/0.4ML Soaj  Inject into the skin. Once a week     MYRBETRIQ 50 MG Tb24 tablet  Generic drug:  mirabegron ER     ONETOUCH DELICA LANCETS FINE Misc  Check blood sugar three times daily.     polycarbophil 625 MG tablet  Commonly known as:  FIBERCON  Take 625 mg by mouth daily.     simvastatin 20 MG tablet  Commonly known as:  ZOCOR  Take 1 tablet (20 mg total) by mouth at bedtime.     tamsulosin 0.4 MG Caps capsule  Commonly known as:  FLOMAX  Take 0.4 mg by mouth daily after supper.     ZIMS MAX-FREEZE 3.7 % Gel  Generic drug:  Menthol (Topical Analgesic)  Apply topically.           Objective:   Physical Exam BP 116/64 mmHg  Pulse 68  Temp(Src) 98.1 F (36.7 C) (Oral)  Ht  5\' 4"  (1.626 m)  Wt 130 lb 4 oz (59.081 kg)  BMI 22.35 kg/m2  SpO2 96% General:   Well developed, well nourished . NAD.  HEENT:  Normocephalic . Face symmetric, atraumatic MSK: wrists: No puffiness, range of motion  normal Right hand normal to inspection on palpation except for DJD changes Left hand: No rash, no blisters. Changes consistent with DJD also observed. Skin: Not pale. Not jaundice Neurologic:  alert & oriented X3.  Speech normal, gait appropriate for age and unassisted Strength symmetric Psych--  Cognition and judgment appear intact.  Cooperative with normal attention span and concentration.  Behavior appropriate. No anxious or depressed appearing.        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 Encourage to discuss that with his orthopedic surgeon as could be related to carpal tunnel syndrome He used take gabapentin 5 tabs a day, currently taking 3 times a day===> go  back to 5 times a day Also encouraged the use of Lidoderm patches.

## 2014-07-05 NOTE — Patient Instructions (Signed)
Increase gabapentin to 5 tablets a day  Use your wrist brace consistently on the left  Lidoderm patches as needed  Discuss with your  orthopedic doctor the pain at the  left hand

## 2014-07-13 ENCOUNTER — Other Ambulatory Visit: Payer: Self-pay

## 2014-07-14 HISTORY — PX: CARPAL TUNNEL RELEASE: SHX101

## 2014-07-20 ENCOUNTER — Encounter (HOSPITAL_BASED_OUTPATIENT_CLINIC_OR_DEPARTMENT_OTHER): Payer: Self-pay | Admitting: Emergency Medicine

## 2014-07-20 ENCOUNTER — Emergency Department (HOSPITAL_BASED_OUTPATIENT_CLINIC_OR_DEPARTMENT_OTHER)
Admission: EM | Admit: 2014-07-20 | Discharge: 2014-07-20 | Discharge status: Against Medical Advice | Payer: BLUE CROSS/BLUE SHIELD | Attending: Emergency Medicine | Admitting: Emergency Medicine

## 2014-07-20 DIAGNOSIS — E119 Type 2 diabetes mellitus without complications: Secondary | ICD-10-CM | POA: Insufficient documentation

## 2014-07-20 DIAGNOSIS — S199XXA Unspecified injury of neck, initial encounter: Secondary | ICD-10-CM | POA: Diagnosis not present

## 2014-07-20 DIAGNOSIS — Y9389 Activity, other specified: Secondary | ICD-10-CM | POA: Diagnosis not present

## 2014-07-20 DIAGNOSIS — S6992XA Unspecified injury of left wrist, hand and finger(s), initial encounter: Secondary | ICD-10-CM | POA: Diagnosis not present

## 2014-07-20 DIAGNOSIS — Y92410 Unspecified street and highway as the place of occurrence of the external cause: Secondary | ICD-10-CM | POA: Insufficient documentation

## 2014-07-20 DIAGNOSIS — Y998 Other external cause status: Secondary | ICD-10-CM | POA: Insufficient documentation

## 2014-07-20 DIAGNOSIS — S4991XA Unspecified injury of right shoulder and upper arm, initial encounter: Secondary | ICD-10-CM | POA: Diagnosis not present

## 2014-07-20 DIAGNOSIS — Z720 Tobacco use: Secondary | ICD-10-CM | POA: Diagnosis not present
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
## 2014-07-20 NOTE — ED Notes (Signed)
Pt awaits md eval, states he has to be in [place] to pick up his wife at work at 1:00. Pt asked to notify staff if he has to leave. Pt verbalizes understanding. Pt states 'I had an appointment with my doctor at 3:00, but I thought this would be faster..." explained nature and scope of the ER to pt, pt verbalizes understanding.

## 2014-07-20 NOTE — ED Notes (Signed)
Pt states that he has to leave right now to pick up his wife. Pt encouraged to attempt to make other arrangements, pt states "we only have one car." pt leaves ed amb with quick steady gait in nad.

## 2014-07-22 ENCOUNTER — Other Ambulatory Visit: Payer: Self-pay

## 2014-07-22 ENCOUNTER — Telehealth: Payer: Self-pay | Admitting: Internal Medicine

## 2014-07-22 MED ORDER — SIMVASTATIN 20 MG PO TABS: 20.0000 mg | ORAL_TABLET | Freq: Every day | ORAL | Status: DC

## 2014-07-22 NOTE — Telephone Encounter (Signed)
Rx sent to [facility] as requested.

## 2014-07-22 NOTE — Telephone Encounter (Signed)
Caller name: Carsin Relation to pt: Call back number: [phone] Pharmacy:  Reason for call:   Patient requesting a 90 day simvastatin refill to be sent to [facility]

## 2014-07-25 ENCOUNTER — Ambulatory Visit: Payer: BLUE CROSS/BLUE SHIELD | Admitting: Internal Medicine

## 2014-07-29 ENCOUNTER — Encounter: Payer: Self-pay | Admitting: Internal Medicine

## 2014-07-29 ENCOUNTER — Other Ambulatory Visit: Payer: Self-pay | Admitting: Internal Medicine

## 2014-07-29 ENCOUNTER — Ambulatory Visit (INDEPENDENT_AMBULATORY_CARE_PROVIDER_SITE_OTHER): Payer: BLUE CROSS/BLUE SHIELD | Admitting: Internal Medicine

## 2014-07-29 VITALS — BP 142/78 | HR 93 | Temp 98.4°F | Ht 64.0 in | Wt 130.4 lb

## 2014-07-29 DIAGNOSIS — E119 Type 2 diabetes mellitus without complications: Secondary | ICD-10-CM | POA: Diagnosis not present

## 2014-07-29 DIAGNOSIS — R5382 Chronic fatigue, unspecified: Secondary | ICD-10-CM | POA: Diagnosis not present

## 2014-07-29 LAB — HM DIABETES FOOT EXAM

## 2014-07-29 NOTE — Progress Notes (Signed)
Pre visit review using our clinic review tool, if applicable. No additional management support is needed unless otherwise documented below in the visit note. 

## 2014-07-29 NOTE — Assessment & Plan Note (Signed)
Diabetes: Based on the last A1c, metformin dose was increased, recent renal function at rheumatology normal per [facility] Plan: Check A1c and LFTs

## 2014-07-29 NOTE — Patient Instructions (Signed)
Get your blood work before you leave   Do not drive if he feels sleepy
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
## 2014-07-30 LAB — MICROALBUMIN / CREATININE URINE RATIO
Creatinine, Urine: 59.8 mg/dL
MICROALB/CREAT RATIO: 21.7 mg/g (ref 0.0–30.0)
Microalb, Ur: 1.3 mg/dL (ref <2.0)

## 2014-08-01 ENCOUNTER — Other Ambulatory Visit: Payer: BLUE CROSS/BLUE SHIELD

## 2014-08-01 ENCOUNTER — Telehealth: Payer: Self-pay

## 2014-08-01 DIAGNOSIS — R5382 Chronic fatigue, unspecified: Secondary | ICD-10-CM

## 2014-08-01 DIAGNOSIS — E119 Type 2 diabetes mellitus without complications: Secondary | ICD-10-CM

## 2014-08-01 LAB — CBC WITH DIFFERENTIAL/PLATELET

## 2014-08-01 LAB — AST

## 2014-08-01 LAB — HEMOGLOBIN A1C

## 2014-08-01 LAB — ALT

## 2014-08-01 NOTE — Telephone Encounter (Signed)
Call from the lab and they did not receive the blood on this patient to complete the CBC, ALT, AST, HgbA1c, all labs have been canceled in Epic. They were able to process the urine. Will forward to the lab to call the patient and send to MD to review.     KP

## 2014-08-01 NOTE — Addendum Note (Signed)
Addended by: Peggyann Shoals on: 08/01/2014 03:10 PM   Modules accepted: Orders

## 2014-08-02 LAB — HEMOGLOBIN A1C
Hgb A1c MFr Bld: 7 % — ABNORMAL HIGH (ref <5.7)
Hgb A1c MFr Bld: 7.1 % — ABNORMAL HIGH (ref 4.6–6.5)
MEAN PLASMA GLUCOSE: 154 mg/dL — AB (ref <117)

## 2014-08-02 LAB — CBC WITH DIFFERENTIAL/PLATELET
BASOS ABS: 0.1 10*3/uL (ref 0.0–0.1)
BASOS ABS: 0.1 10*3/uL (ref 0.0–0.1)
BASOS PCT: 1 % (ref 0–1)
BASOS PCT: 1.4 % (ref 0.0–3.0)
EOS ABS: 0.2 10*3/uL (ref 0.0–0.7)
EOS PCT: 2 % (ref 0–5)
EOS PCT: 2.8 % (ref 0.0–5.0)
Eosinophils Absolute: 0.2 10*3/uL (ref 0.0–0.7)
HCT: 36.3 % — ABNORMAL LOW (ref 39.0–52.0)
HCT: 36.1 % — ABNORMAL LOW (ref 39.0–52.0)
Hemoglobin: 11.8 g/dL — ABNORMAL LOW (ref 13.0–17.0)
Hemoglobin: 12 g/dL — ABNORMAL LOW (ref 13.0–17.0)
LYMPHS PCT: 33 % (ref 12–46)
Lymphocytes Relative: 29.7 % (ref 12.0–46.0)
Lymphs Abs: 3.2 10*3/uL (ref 0.7–4.0)
Lymphs Abs: 2.6 10*3/uL (ref 0.7–4.0)
MCH: 29.4 pg (ref 26.0–34.0)
MCHC: 32.5 g/dL (ref 30.0–36.0)
MCHC: 33.2 g/dL (ref 30.0–36.0)
MCV: 90.3 fL (ref 78.0–100.0)
MCV: 89.4 fl (ref 78.0–100.0)
MPV: 8.7 fL (ref 8.6–12.4)
Monocytes Absolute: 0.7 10*3/uL (ref 0.1–1.0)
Monocytes Absolute: 0.5 10*3/uL (ref 0.1–1.0)
Monocytes Relative: 7 % (ref 3–12)
Monocytes Relative: 5.9 % (ref 3.0–12.0)
NEUTROS PCT: 60.2 % (ref 43.0–77.0)
Neutro Abs: 5.6 10*3/uL (ref 1.7–7.7)
Neutro Abs: 5.2 10*3/uL (ref 1.4–7.7)
Neutrophils Relative %: 57 % (ref 43–77)
PLATELETS: 333 10*3/uL (ref 150.0–400.0)
Platelets: 356 10*3/uL (ref 150–400)
RBC: 4.02 MIL/uL — AB (ref 4.22–5.81)
RBC: 4.04 Mil/uL — ABNORMAL LOW (ref 4.22–5.81)
RDW: 15.8 % — ABNORMAL HIGH (ref 11.5–15.5)
RDW: 15.9 % — ABNORMAL HIGH (ref 11.5–15.5)
WBC: 9.8 10*3/uL (ref 4.0–10.5)
WBC: 8.7 10*3/uL (ref 4.0–10.5)

## 2014-08-02 LAB — ALT: ALT: 11 U/L (ref 0–53)

## 2014-08-02 LAB — AST: AST: 13 U/L (ref 0–37)

## 2014-08-03 NOTE — Progress Notes (Signed)
Quick Note:  Pt returned to lab on 08/01/14 for a redraw. Results in chart. ______

## 2014-08-04 ENCOUNTER — Telehealth: Payer: Self-pay | Admitting: Internal Medicine

## 2014-08-04 LAB — GAMMA GT: GGT: 22 U/L (ref 7–51)

## 2014-08-04 LAB — AST: AST: 15 U/L (ref 10–35)

## 2014-08-04 NOTE — Telephone Encounter (Signed)
Pt returning call for lab results. Please call at [phone].

## 2014-08-04 NOTE — Telephone Encounter (Signed)
Returned call and notified of lab results- see lab note.

## 2014-08-05 ENCOUNTER — Institutional Professional Consult (permissible substitution): Payer: BLUE CROSS/BLUE SHIELD | Admitting: Pulmonary Disease

## 2014-08-08 NOTE — Telephone Encounter (Signed)
Pt spoke with Caryl Pina last week regarding lab results. See lab result notes.

## 2014-08-08 NOTE — Telephone Encounter (Signed)
PATIENT RETURNING YOUR CALL   [phone]

## 2014-08-09 ENCOUNTER — Telehealth: Payer: Self-pay | Admitting: Internal Medicine

## 2014-08-09 MED ORDER — FREESTYLE FREEDOM LITE W/DEVICE KIT: PACK | Status: DC

## 2014-08-09 MED ORDER — FREESTYLE LANCETS MISC: Status: DC

## 2014-08-09 MED ORDER — GLUCOSE BLOOD VI STRP: ORAL_STRIP | Status: DC

## 2014-08-09 NOTE — Telephone Encounter (Signed)
Relation to BT:DVVO  Call back number: [phone]  Reason for call:  As per patient Christian Roberts is not working due to patient arthritis and would like a blood sugar monitor were he does not have to stick. Patient will contact insurance to see what they will cover patient wanted to know if MD can recommend as well.

## 2014-08-09 NOTE — Telephone Encounter (Signed)
See phone note from 08/09/2014.

## 2014-08-09 NOTE — Telephone Encounter (Signed)
Caller name:Goranson Gerson Relation to RF:VOHK Call back number:[phone] [facility]  Reason for call: pt is needing a new meter states he needs either contour next or free style lite or free style freedom, pt states either one of those are covered at 100%, states [facility] has to have a clinical exception as to why the pt has to use a particular type. Pt will also need lancets and strips as well. To provide the clinical exception you will need to call [phone]. Pt has fluid in his fingers and its to painful to stick his fingers is why he needs the meter.

## 2014-08-09 NOTE — Telephone Encounter (Signed)
Please advise. I do know most insurances do not cover non-stick glucometer's.

## 2014-08-09 NOTE — Telephone Encounter (Signed)
Freestyle Lite/Freedom device, lancets and strips sent to [facility] informing them that Pt's insurance will only cover Freestyle Lite/Freedom device/supplies. Instructed them to call if they have any questions.

## 2014-08-09 NOTE — Telephone Encounter (Signed)
Caller name:Stallman Tage Relation to TE:LMRA Call back number:[phone] [facility]acy[facility]  Reason for call: pt is needing a new meter states he needs either contour next or free style lite or free style freedom, pt states either one of those are covered at 100%, states [facility] has to have a clinical exception as to why the pt has to use a particular type. Pt will also need lancets and strips as well. To provide the clinical exception you will need to call [phone]. Pt has fluid in his fingers and its to painful to stick his fingers is why he needs the meter.

## 2014-08-09 NOTE — Telephone Encounter (Signed)
Please call the 1 800 number and I will talk with however I need to

## 2014-08-10 ENCOUNTER — Other Ambulatory Visit: Payer: Self-pay

## 2014-08-10 MED ORDER — GABAPENTIN 300 MG PO CAPS: 300.0000 mg | ORAL_CAPSULE | Freq: Every day | ORAL | Status: DC

## 2014-08-15 ENCOUNTER — Telehealth: Payer: Self-pay | Admitting: Internal Medicine

## 2014-08-15 NOTE — Telephone Encounter (Signed)
Caller name: Sharyn Lull Relationship to patient: [facility] Can be reached: [phone] ref # [id number] Pharmacy:  Reason for call: Says that pt's free style script that's being requested isn't covered by his insurance.

## 2014-08-15 NOTE — Telephone Encounter (Signed)
Please see previous phone notes. Pt called last week with names of devices that his insurances would pay. Freestyle was listed as covered. [facility] and Pt's insurance will need to discuss.

## 2014-08-16 ENCOUNTER — Telehealth: Payer: Self-pay | Admitting: Internal Medicine

## 2014-08-16 NOTE — Telephone Encounter (Signed)
Kallie Locks with [facility] Ph# [phone]  Call back if NOT ok to send the lancets to go with the new test strips. Pt getting a new meter too.

## 2014-08-16 NOTE — Telephone Encounter (Signed)
[facility] would like for you to give them a call, [phone] pt reference # [id number], to change the rx or start a coverage review for the pt, states after today and new rx would have to be written regardless. States it is a non-formulated rx so the insurance does not cover.

## 2014-08-16 NOTE — Telephone Encounter (Signed)
Spoke with Christian Roberts at [facility], changed rx to One Touch Verio test strips and lancets. They will be sending a free One Touch Verio device to Pt as well.

## 2014-08-16 NOTE — Telephone Encounter (Signed)
Noted. Okay to send new lancets.

## 2014-08-19 NOTE — Telephone Encounter (Signed)
Caller name: Hakeem Relationship to patient: self Can be reached: [phone]  Reason for call: Pt called in stating that he received the same type meter and testing supplies he had. The goal was to get a meter and device that would allow him to test in alternate sites as he has issues with his fingertips. Pt states that he provided names of 2 meters to Dr. Larose Kells and stated that he would have to give medical exception in order for insurance to pay for the new meter and device to test alternate sites. 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 He states that he has trouble with the buttons on the meter as well as trouble sticking his fingertips. Please try to find the information he provided so that the correct meter is ordered for him. Pt is requesting that he is called back about this issue.

## 2014-08-19 NOTE — Telephone Encounter (Signed)
Spoke with Pt, informed him that per One Touch's website, the One Touch Ultra 2 and Mini's can be used to test alternative sites such as the palms and forearms. Pt stated he has not received new meter from [facility] yet, I instructed him to call me Monday or Tuesday if he still has not received the new meter, strips and lancets. I also informed him I would be printing the correct directions to check on forearm and palms and will be mailing it to him. Pt verbalized understanding.

## 2014-08-25 HISTORY — PX: ROTATOR CUFF REPAIR W/ DISTAL CLAVICLE EXCISION: SHX2365

## 2014-08-25 MED ORDER — ONETOUCH ULTRA SYSTEM W/DEVICE KIT: PACK | Status: DC

## 2014-08-25 NOTE — Addendum Note (Signed)
Addended by: Wilfrid Lund on: 08/25/2014 03:05 PM   Modules accepted: Orders

## 2014-08-25 NOTE — Telephone Encounter (Signed)
Rx resent for One Touch Ultra 2 device.

## 2014-08-25 NOTE — Telephone Encounter (Signed)
Pt called about not receiving the meter from [facility]. Per below he was to call you back. Best # [phone].

## 2014-09-11 ENCOUNTER — Other Ambulatory Visit: Payer: Self-pay | Admitting: Internal Medicine

## 2014-09-13 ENCOUNTER — Ambulatory Visit: Payer: BLUE CROSS/BLUE SHIELD | Admitting: Neurology

## 2014-09-29 ENCOUNTER — Institutional Professional Consult (permissible substitution): Payer: BLUE CROSS/BLUE SHIELD | Admitting: Pulmonary Disease

## 2014-10-20 LAB — CBC AND DIFFERENTIAL
HEMATOCRIT: 37 % — AB (ref 41–53)
Hemoglobin: 11.8 g/dL — AB (ref 13.5–17.5)
Neutrophils Absolute: 3 /uL
Platelets: 322 10*3/uL (ref 150–399)
WBC: 6.2 10*3/mL

## 2014-10-20 LAB — BASIC METABOLIC PANEL
BUN: 13 mg/dL (ref 4–21)
Creatinine: 0.9 mg/dL (ref 0.6–1.3)
GLUCOSE: 79 mg/dL
POTASSIUM: 5.2 mmol/L (ref 3.4–5.3)
Sodium: 146 mmol/L (ref 137–147)

## 2014-10-20 LAB — HEPATIC FUNCTION PANEL
ALK PHOS: 72 U/L (ref 25–125)
ALT: 16 U/L (ref 10–40)
AST: 16 U/L (ref 14–40)
Bilirubin, Total: 0.4 mg/dL

## 2014-10-31 ENCOUNTER — Telehealth: Payer: Self-pay | Admitting: Gastroenterology

## 2014-10-31 ENCOUNTER — Encounter: Payer: Self-pay | Admitting: Internal Medicine

## 2014-10-31 NOTE — Telephone Encounter (Signed)
FYI information. See below for the original note.

## 2014-10-31 NOTE — Telephone Encounter (Signed)
Former patient of Dr Kelby Fam. History of hemorrhoids. Noted rectal blood with a bowel movement. Attempted to use a suppository, but could not get it in. Used Preparation H. He is still having rectal blood. Spotting in his underpants. He will go to Urgent Care where he is [facility]). Appointment scheduled here with Alonza Bogus, PA 11/08/14 for follow up.

## 2014-11-02 ENCOUNTER — Telehealth: Payer: Self-pay

## 2014-11-02 NOTE — Telephone Encounter (Signed)
FYI: Patient called in states he is in [place] visiting his daughter. States he was advised he may have internal bleeding. States his HGBhas dropped as follows 11.7,11.0-10.7. Patient calling to see if he can get an appointment with Gertie Fey here sooner than he can get an appointment in [place]. Advised patient to got to ER so that this could be addressed immediately.  Patient agreed.

## 2014-11-02 NOTE — Telephone Encounter (Signed)
He has an appointment to see GI 11/08/2014. I suggest him to keep that appointment. If severe problems, needs to go to the ER

## 2014-11-03 NOTE — Telephone Encounter (Signed)
Called patient left message for call back.  

## 2014-11-04 ENCOUNTER — Ambulatory Visit: Payer: BLUE CROSS/BLUE SHIELD | Admitting: Internal Medicine

## 2014-11-08 ENCOUNTER — Encounter: Payer: Self-pay | Admitting: Gastroenterology

## 2014-11-08 ENCOUNTER — Telehealth: Payer: Self-pay | Admitting: Internal Medicine

## 2014-11-08 ENCOUNTER — Ambulatory Visit (INDEPENDENT_AMBULATORY_CARE_PROVIDER_SITE_OTHER): Payer: BLUE CROSS/BLUE SHIELD | Admitting: Gastroenterology

## 2014-11-08 VITALS — BP 120/72 | HR 100 | Ht 64.0 in | Wt 132.0 lb

## 2014-11-08 DIAGNOSIS — R197 Diarrhea, unspecified: Secondary | ICD-10-CM | POA: Diagnosis not present

## 2014-11-08 DIAGNOSIS — K7689 Other specified diseases of liver: Secondary | ICD-10-CM

## 2014-11-08 DIAGNOSIS — K625 Hemorrhage of anus and rectum: Secondary | ICD-10-CM

## 2014-11-08 DIAGNOSIS — R935 Abnormal findings on diagnostic imaging of other abdominal regions, including retroperitoneum: Secondary | ICD-10-CM

## 2014-11-08 DIAGNOSIS — Q6100 Congenital renal cyst, unspecified: Secondary | ICD-10-CM

## 2014-11-08 DIAGNOSIS — N281 Cyst of kidney, acquired: Secondary | ICD-10-CM

## 2014-11-08 NOTE — Patient Instructions (Addendum)
Alonza Bogus P.A would like you to schedule a colonoscopy, an abdominal ultrasound, and some stool studies.  Please call the office once you make a decision.

## 2014-11-08 NOTE — Telephone Encounter (Signed)
Pt states the info below again and wanted to be able to talk with Dr. Larose Kells that he is not feeling good at all and is wanting to see if he can be sent someone else or have another suggestion with other Dr. To be seen soon. Please advise. ASAP.

## 2014-11-08 NOTE — Progress Notes (Signed)
     11/08/2014 Christian Roberts [id number] Mar 06, 1945   History of Present Illness:  This is a 69 year old male who is previously known to Dr. Deatra Ina. He has long-standing issues with hemorrhoids and his last 2 office visit have been for issues with hemorrhoids. His last colonoscopy was in October 2002 at which time his found have just diverticulosis. He was due for another colonoscopy in 2012, but says that he had a lot of other issues with orthopedic surgeries, etc. that he needed to address so never proceeded with that. Now he presents to the office today with complaints of rectal bleeding and change in bowel habits with diarrhea recently. He says that he's been having issues with some loose stools for a while now, but for the last 2 weeks has been having constant diarrhea throughout the day. He was in [place] visiting family when this began and he started having rectal bleeding as well. He went to the urgent care where CT scan of the abdomen and pelvis was performed with contrast on October 24. This showed sigmoid diverticulosis without diverticulitis or other inflammatory process in the abdomen. It also showed a 3.3 x 1.6 x 1.5 cm cluster of cysts anterior to the midportion of the liver in the expected location of the gallbladder possibly representing complex exophytic hepatic cyst or a collapsed gallbladder with adenomyomatosis; recommended ultrasound for further evaluation. It also showed a 5.2 x 5.4 cm cyst between the tail of the pancreas in the kidney with the exact origin difficult to determine but likely representing an exophytic renal cyst rather than a pancreatic cystic lesion; recommending ultrasound correlation with that as well. Has not yet seen his PCP for follow-up since his visits in [place]. His hemoglobin on October 24 was 11.7 g, October 25 was 11.0 g, and October 26 at 10.7 g. His bleeding has since resolved since using Proctosol rectal cream.  Current Medications, Allergies,  Past Medical History, Past Surgical History, Family History and Social History were reviewed in [employer] record.   Physical Exam: BP 120/72 mmHg  Pulse 100  Ht 5\' 4"  (1.626 m)  Wt 132 lb (59.875 kg)  BMI 22.65 kg/m2 General: Well developed white male in no acute distress Head: Normocephalic and atraumatic Eyes:  Sclerae anicteric, conjunctiva pink  Ears: Normal auditory acuity Lungs: Clear throughout to auscultation Heart: Regular rate and rhythm Abdomen: Soft, non-distended.  Somewhat hyperactive bowel sounds.  Non-tender. Rectal:  Will be done at the time of colonoscopy. Musculoskeletal: Symmetrical with no gross deformities  Extremities: No edema  Neurological: Alert oriented x 4, grossly non-focal Psychological:  Alert and cooperative. Normal mood and affect  Assessment and Recommendations: -Rectal bleeding:  Patient has long-standing history of hemorrhoids and bleeding has resolved at this time with the use of Proctosol rectal cream, but he is overdue for colonoscopy by 4 years. We'll schedule for colonoscopy.  The risks, benefits, and alternatives to colonoscopy were discussed with the patient and he consents to proceed.  -Change in bowel habits:  Has experienced some change in bowel habits for a while now, but recently with diarrhea over the past 2 weeks. Once again colonoscopy is going to be scheduled, but we'll check stool GI pathogen panel and ova and parasites in the interim. -Renal cysts and hepatic cysts seen on recent CT scan:  Recommending further evaluation with ultrasound.  We'll schedule abdominal ultrasound.

## 2014-11-08 NOTE — Telephone Encounter (Signed)
Relation to AU:QJFH Call back [phone]   Reason for call:  Patient states [facility] cant see him until mid January seeking another referral to another Gertie Fey, patient states he needs to be seen as soon as possible, please advise

## 2014-11-09 NOTE — Telephone Encounter (Signed)
I reviewed the GI note, apparently no urgent symptoms that warrant a emergent colonoscopy. He has a long history of rectal bleeding and at the time of the GI visit symptoms resolved. rec to proceed w/ the colonoscopy as they recommended, if he has more hemorrhoid problems I could see him in this office and try to help. If he likes to pursue a visit with another GI group, I can arrange that but I cannot guarantee him they will see him soon or  do a urgent colonoscopy. If symptoms change in any way let me know.

## 2014-11-09 NOTE — Telephone Encounter (Signed)
Pt is wanting to talk to Dr. Larose Kells. He is very upset about what happened at the GI office 11/08/14. He said after meeting with the PA he was told colonoscopy cannot be done until January. They won't take care of hemmoroids until after colonoscopy. He said that he is needing a stool test and an ultrasound on kidney, liver, and pancreas after being in [place] and being told they found cysts. Pt is very upset that we did not call him yesterday or this morning. He wants colonoscopy before January due the bleeding he is having. He said he told GI that and he was told if he goes somewhere else they will not keep him on as a pt. Pt is wanting advice from Dr. Larose Kells.

## 2014-11-09 NOTE — Telephone Encounter (Signed)
Spoke with Pt, informed him of your advice. Pt has scheduled appt Monday, November 14, 2014 at 1:15 PM to discuss findings in [place] and hemorrhoids. Informed Pt that I don't believe we have received records from [facility] and Pt states that they should have been faxed. Informed him I would ask if records were received. Pt verbalized understanding.

## 2014-11-09 NOTE — Telephone Encounter (Signed)
Pt seen by GI yesterday, 11/1 by Alonza Bogus, PA.

## 2014-11-09 NOTE — Progress Notes (Signed)
Agree with assessment and plan as outlined.  

## 2014-11-14 ENCOUNTER — Encounter: Payer: Self-pay | Admitting: Internal Medicine

## 2014-11-14 ENCOUNTER — Ambulatory Visit (INDEPENDENT_AMBULATORY_CARE_PROVIDER_SITE_OTHER): Payer: BLUE CROSS/BLUE SHIELD | Admitting: Internal Medicine

## 2014-11-14 ENCOUNTER — Encounter: Payer: Self-pay | Admitting: Neurology

## 2014-11-14 ENCOUNTER — Ambulatory Visit (INDEPENDENT_AMBULATORY_CARE_PROVIDER_SITE_OTHER): Payer: BLUE CROSS/BLUE SHIELD | Admitting: Neurology

## 2014-11-14 VITALS — BP 140/70 | HR 99 | Temp 97.9°F | Wt 131.8 lb

## 2014-11-14 VITALS — BP 121/62 | HR 103 | Ht 64.0 in | Wt 132.5 lb

## 2014-11-14 DIAGNOSIS — K7689 Other specified diseases of liver: Secondary | ICD-10-CM | POA: Diagnosis not present

## 2014-11-14 DIAGNOSIS — R197 Diarrhea, unspecified: Secondary | ICD-10-CM | POA: Diagnosis not present

## 2014-11-14 DIAGNOSIS — G8929 Other chronic pain: Secondary | ICD-10-CM | POA: Insufficient documentation

## 2014-11-14 DIAGNOSIS — R202 Paresthesia of skin: Secondary | ICD-10-CM | POA: Diagnosis not present

## 2014-11-14 DIAGNOSIS — Z23 Encounter for immunization: Secondary | ICD-10-CM | POA: Diagnosis not present

## 2014-11-14 DIAGNOSIS — Z09 Encounter for follow-up examination after completed treatment for conditions other than malignant neoplasm: Secondary | ICD-10-CM

## 2014-11-14 DIAGNOSIS — E119 Type 2 diabetes mellitus without complications: Secondary | ICD-10-CM | POA: Diagnosis not present

## 2014-11-14 DIAGNOSIS — R519 Headache, unspecified: Secondary | ICD-10-CM

## 2014-11-14 DIAGNOSIS — R51 Headache: Secondary | ICD-10-CM

## 2014-11-14 MED ORDER — TOPIRAMATE 25 MG PO TABS: ORAL_TABLET | ORAL | Status: DC

## 2014-11-14 NOTE — Patient Instructions (Signed)
Lab:  Please enter orders sool studies: C. difficile, WBCs, culture  DX diarrhea Provide the patient with containers  Use Proctosol and hydrocortisone 2.5% cream as needed for hemorrhoids  Call anytime if you have severe symptoms  Come back in 3 months. Sooner if needed.

## 2014-11-14 NOTE — Progress Notes (Signed)
Subjective:    Patient ID: Christian Roberts, male    DOB: 1945/01/09, 69 y.o.   MRN: [id number]  DOS:  11/14/2014 Type of visit - description : Several issues to discuss Interval history: Having diarrhea on and off for several weeks. Symptoms started before he went to [place], stools are described as soft, loose or watery. He also saw blood in the toilet paper, he thinks related to wiping so much due to frequent stooling. While in [place], went to see a doctor because the diarrhea and also headache. Had an  abdominal CT, see below. Because of the headache he was prescribed a strong antibiotic, was possible sinusitis. (Diarrhea started before the antibiotic). Subsequently, saw our  GI doctor: They recommend a colonoscopy (no urgent), also a stool study that has not been done.  From the GI note: "He was in [place] visiting family when this began and he started having rectal bleeding as well. He went to the urgent care where CT scan of the abdomen and pelvis was performed with contrast on October 24.  This showed ....... a 3.3 x 1.6 x 1.5 cm cluster of cysts anterior to the midportion of the liver in the expected location of the gallbladder possibly representing complex exophytic hepatic cyst or a collapsed gallbladder with adenomyomatosis; It also showed a 5.2 x 5.4 cm cyst between the tail of the pancreas in the kidney with the exact origin difficult to determine but likely representing an exophytic renal cyst rather than a pancreatic cystic lesion recommending ultrasound correlation with that as well."  Also, he is taking metformin and his blood sugars are sometimes in the 200s. Other readings are elevated, this morning was 127.   Review of Systems Denies fever chills Appetite normal. When asked, admits that he is drinking lots of fluids and urinating frequently. No weight loss No nausea or vomiting. No blood in the stools on when he wipes. Admits to some upper abdominal pain,  ill-defined, "like a muscle ache" but when he has a bowel movement it does feels like crampy pain.  Past Medical History  Diagnosis Date  . Allergic rhinitis   . GERD (gastroesophageal reflux disease)   . Hyperlipidemia   . Hernia   . Diabetes mellitus     2012  . Headache(784.0)     headaches-related to sinuses & uses Tylenol   . Skin cancer, basal cell     sees derm  . Osteoarthritis     ankle- left, hands & neck & hip  . Complication of anesthesia   . PONV (postoperative nausea and vomiting)     WITH ETHER  . Asthma     "I outgrew it"  . Numbness of fingers   . Constipation   . Nocturia   . Chronic rheumatic arthritis ([place]) 01/20/2014    Per Dr. Amil Amen   . Complete tear of rotator cuff 2016    Right, MRI in 2016, complete rupture of long head of the biceps as well as a tear of the subscapularis and supraspinatus tendons, Dr. Mardelle Matte  . Carpal tunnel syndrome   . Hemorrhoids   . Headache disorder 11/14/2014    Past Surgical History  Procedure Laterality Date  . Ankle surgery Left   . Esophagogastroduodenoscopy    . Tonsillectomy    . Varicocelectomy    . Left ankle mass removed  1972    3 surgeries on L ankle- 73, 2008, 2009  . Neck surgery  03/11,     C3-C7  .  Back surgery      cerv. fusion C1- T1, 2 separate surgeries , T1-T3 2014  . Appendectomy      1964  . Cystoscopy      multiple times for infections  . Inguinal hernia repair  03/27/2011    Procedure: HERNIA REPAIR INGUINAL ADULT;  Surgeon: Harl Bowie, MD;  Location: [facility];  Service: General;  Laterality: Left;  . Carpal tunnel release Right   . Rectal surgery  at birth    "[place]" SURGICAL CORRECTION  . Shoulder surgery  09/17/12  . Inguinal hernia repair Bilateral 12/09/2012    Procedure: LAPAROSCOPIC BILATERAL INGUINAL HERNIA REPAIR WITH MESH;  Surgeon: Harl Bowie, MD;  Location: [facility];  Service: General;  Laterality: Bilateral;  . Insertion of mesh Bilateral 12/09/2012     Procedure: INSERTION OF MESH;  Surgeon: Harl Bowie, MD;  Location: [facility];  Service: General;  Laterality: Bilateral;  . Ankle fusion Left 03-19-13    Dr Mardelle Matte  . Carpal tunnel release Left 07-14-2014    [place]  . Rotator cuff repair w/ distal clavicle excision Right 08/25/2014    And acromioplasty, Dr. Mardelle Matte    Social History   Social History  . Marital Status: Married    Spouse Name: Arbie Cookey  . Number of Children: 3  . Years of Education: BA+   Occupational History  . 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 Smoking status: Current Every Day Smoker -- 5.00 packs/day    Types: Cigarettes    Last Attempt to Quit: 04/10/2012  . Smokeless tobacco: Never Used     Comment: quit on-off since 2014   . Alcohol Use: No  . Drug Use: No  . Sexual Activity: Not on file   Other Topics Concern  . Not on file   Social History Narrative   Married , lives w/ wife   Caffeine Use: 7-8 cups daily        Medication List       This list is accurate as of: 11/14/14  8:21 PM.  Always use your most recent med list.               acetaminophen 500 MG tablet  Commonly known as:  TYLENOL  Take 500-1,000 mg by mouth every 4 (four) hours as needed. For pain     albuterol 108 (90 BASE) MCG/ACT inhaler  Commonly known as:  VENTOLIN HFA  Inhale 2 puffs into the lungs every 6 (six) hours as needed for wheezing or shortness of breath.     BENADRYL 25 MG tablet  Generic drug:  diphenhydrAMINE  Take 25 mg by mouth every 6 (six) hours as needed.     DOCUSATE SODIUM PO  Take by mouth.     ENBREL 50 MG/ML injection  Generic drug:  etanercept  Inject 50 mg into the skin once a week.     esomeprazole 10 MG packet  Commonly known as:  NEXIUM  Take 10 mg by mouth daily before breakfast.     fish oil-omega-3 fatty acids 1000 MG capsule  Take 1 g by mouth 2 (two) times daily.     fluticasone 50 MCG/ACT nasal spray  Commonly known as:  FLONASE  Place 1 spray into both  nostrils daily.     folic acid 1 MG tablet  Commonly known as:  FOLVITE  Take 1 mg by mouth daily.     freestyle lancets  Check blood sugars no more than twice daily.     gabapentin 300 MG capsule  Commonly known as:  NEURONTIN  Take 1 capsule (300 mg total) by mouth 5 (five) times daily.     GAS-X PO  Take by mouth.     glucose blood test strip  Commonly known as:  FREESTYLE LITE  Check blood sugar no more than twice daily.     hydrocortisone 2.5 % cream  Apply 1 application topically 2 (two) times daily.     lidocaine 5 %  Commonly known as:  LIDODERM  Place 1 patch onto the skin daily.     METAMUCIL PO  Take by mouth.     metFORMIN 1000 MG tablet  Commonly known as:  GLUCOPHAGE  Take 1 tablet (1,000 mg total) by mouth 2 (two) times daily with a meal.     Methotrexate (PF) 20 MG/0.4ML Soaj  Inject into the skin. Once a week     MYRBETRIQ 50 MG Tb24 tablet  Generic drug:  mirabegron ER     ONE TOUCH ULTRA SYSTEM KIT W/DEVICE Kit  Use device to check blood sugar.     PROCTOSOL HC 2.5 % rectal cream  Generic drug:  hydrocortisone     Pseudoephedrine HCl (Deter) 30 MG Taba  Take 1 tablet by mouth as needed.     simvastatin 20 MG tablet  Commonly known as:  ZOCOR  Take 1 tablet (20 mg total) by mouth at bedtime.     tamsulosin 0.4 MG Caps capsule  Commonly known as:  FLOMAX  Take 0.4 mg by mouth daily after supper.     topiramate 25 MG tablet  Commonly known as:  TOPAMAX  Take one tablet at night for one week, then take 2 tablets at night for one week, then take 3 tablets at night.     ZIMS MAX-FREEZE 3.7 % Gel  Generic drug:  Menthol (Topical Analgesic)  Apply topically.           Objective:   Physical Exam BP 140/70 mmHg  Pulse 99  Temp(Src) 97.9 F (36.6 C)  Wt 131 lb 12.8 oz (59.784 kg)  SpO2 97% General:   Well developed, well nourished . NAD.  HEENT:  Normocephalic . Face symmetric, atraumatic Lungs:  CTA B Normal respiratory  effort, no intercostal retractions, no accessory muscle use. Heart: RRR,  no murmur.  no pretibial edema bilaterally  Abdomen:  Not distended, soft, non-tender. No rebound or rigidity. No mass,organomegaly Skin: Not pale. Not jaundice Neurologic:  alert & oriented X3.  Speech normal, gait appropriate for age and unassisted Psych--  Cognition and judgment appear intact.  Cooperative with normal attention span and concentration.  Behavior appropriate. No anxious or depressed appearing.    Assessment & Plan:   Assessment > DM 2012 Hyperlipidemia GERD Rheumatoid arthritis -- on enbrel - MTX DJD multiple sites, cervical spondylosis CTS Headaches GU: --Elevated PSA --BPH Skin cancer, BCC  Plan: Labs: This morning, had a CBC, BMP, LFTs, sedimentation rate and C-reactive protein ordered by neurology. Will add A1c DM: Good compliance with metformin, last A1c around 7, apparently CBGs have been high lately, he is also very thirsty. We are adding an A1c today. Further advice would results Diarrhea:  Agree w/ GI, will get stool studies Hemorrhoids: See instructions Liver and pancreatic cysts: I have not seen the report per se just a GI note, will order a ultrasound Headaches: per neurology RTC 3 months

## 2014-11-14 NOTE — Patient Instructions (Addendum)
   We will check EMG and NCV to evaluate the hand numbness. We will get a carotid doppler study to evaluate the headache, and start Topamax for the headache.  Topamax (topiramate) is a seizure medication that has an FDA approval for seizures and for migraine headache. Potential side effects of this medication include weight loss, cognitive slowing, tingling in the fingers and toes, and carbonated drinks will taste bad. If any significant side effects are noted on this drug, please contact our office.  General Headache Without Cause A headache is pain or discomfort felt around the head or neck area. There are many causes and types of headaches. In some cases, the cause may not be found.  HOME CARE  Managing Pain  Take over-the-counter and prescription medicines only as told by your doctor.  Lie down in a dark, quiet room when you have a headache.  If directed, apply ice to the head and neck area:  Put ice in a plastic bag.  Place a towel between your skin and the bag.  Leave the ice on for 20 minutes, 2-3 times per day.  Use a heating pad or hot shower to apply heat to the head and neck area as told by your doctor.  Keep lights dim if bright lights bother you or make your headaches worse. Eating and Drinking  Eat meals on a regular schedule.  Lessen how much alcohol you drink.  Lessen how much caffeine you drink, or stop drinking caffeine. General Instructions  Keep all follow-up visits as told by your doctor. This is important.  Keep a journal to find out if certain things bring on headaches. For example, write down:  What you eat and drink.  How much sleep you get.  Any change to your diet or medicines.  Relax by getting a massage or doing other relaxing activities.  Lessen stress.  Sit up straight. Do not tighten (tense) your muscles.  Do not use tobacco products. This includes cigarettes, chewing tobacco, or e-cigarettes. If you need help quitting, ask your  doctor.  Exercise regularly as told by your doctor.  Get enough sleep. This often means 7-9 hours of sleep. GET HELP IF:  Your symptoms are not helped by medicine.  You have a headache that feels different than the other headaches.  You feel sick to your stomach (nauseous) or you throw up (vomit).  You have a fever. GET HELP RIGHT AWAY IF:   Your headache becomes really bad.  You keep throwing up.  You have a stiff neck.  You have trouble seeing.  You have trouble speaking.  You have pain in the eye or ear.  Your muscles are weak or you lose muscle control.  You lose your balance or have trouble walking.  You feel like you will pass out (faint) or you pass out.  You have confusion.   This information is not intended to replace advice given to you by your health care provider. Make sure you discuss any questions you have with your health care provider.   Document Released: 10/03/2007 Document Revised: 09/14/2014 Document Reviewed: 04/18/2014 Elsevier Interactive Patient Education [employer].

## 2014-11-14 NOTE — Assessment & Plan Note (Signed)
Labs: This morning, had a CBC, BMP, LFTs, sedimentation rate and C-reactive protein ordered by neurology. Will add A1c DM: Good compliance with metformin, last A1c around 7, apparently CBGs have been high lately, he is also very thirsty. We are adding an A1c today. Further advice would results Diarrhea:  Agree w/ GI, will get stool studies Hemorrhoids: See instructions Liver and pancreatic cysts: I have not seen the report per se just a GI note, will order a ultrasound Headaches: per neurology RTC 3 months

## 2014-11-14 NOTE — Progress Notes (Signed)
Reason for visit: Headache, hand numbness  Referring physician: Dr. Janice Norrie is a 69 y.o. male  History of present illness:  Christian Roberts is a 69 year old right-handed white male with a history of diabetes and rheumatoid arthritis. He is sent to this office for issues with numbness in both hands, and some discomfort in the joints of the fingers on both hands. The patient is having difficulty feeling small objects, he will drop things from his hands. He indicates that he has had bilateral carpal tunnel syndrome surgery, the left side was done in the summer of 2016. The patient has not had any significant improvement in symptoms on either side. He indicates that he also has a history of significant cervical spine disease, he has had 3 cervical spine procedures with fusions. In the past, he has had electric shock pains down the arms from the neck, but this is no longer occurring. He has not had any severe weakness of the arms. He denies any significant numbness of the feet. He does have some difficulty with balance, he may stagger some. Within the last 4 weeks, he has began to have headaches that are on the left frontotemporal area, these headaches may occur 1-4 times a day. The headaches last anywhere from 40 minutes to an hour and a half, and are associated with a throbbing sensation. The patient may have some blurred vision, and slight nausea. He indicates that he has had headaches off and on throughout his life. He had severe headaches between February and April 2016 that are identical to his current headaches that began 3-4 weeks ago. The patient underwent MRI evaluation of the brain that was unremarkable with exception that a left mastoiditis was noted. The patient does not report pain around the left ear currently. The patient has taken some Tylenol for the discomfort. He recently has had a lot of problems with bleeding from his hemorrhoids, he is had some anemia associated with this.  The patient is sent to this office for further evaluation. He denies problems controlling the bladder, he does have some frequent issues with bowel movements. The patient indicates that he does have a history of syncope that occurred recently within the last 4 weeks, all events have been associated with having a bowel movement, then standing up afterwards, getting dizzy, with subsequent loss of consciousness. His diabetes has been under relatively good control, the hemoglobin A1c was 7.1 recently. The patient is to be evaluated with a sleep study for excessive daytime drowsiness. The patient has difficulty staying awake when he is inactive.  Past Medical History  Diagnosis Date  . Allergic rhinitis   . GERD (gastroesophageal reflux disease)   . Hyperlipidemia   . Hernia   . Diabetes mellitus     2012  . Headache(784.0)     headaches-related to sinuses & uses Tylenol   . Skin cancer, basal cell     sees derm  . Osteoarthritis     ankle- left, hands & neck & hip  . Complication of anesthesia   . PONV (postoperative nausea and vomiting)     WITH ETHER  . Asthma     "I outgrew it"  . Numbness of fingers   . Constipation   . Nocturia   . Chronic rheumatic arthritis ([place]) 01/20/2014    Per Dr. Amil Amen   . Complete tear of rotator cuff 2016    Right, MRI in 2016, complete rupture of long head of the biceps  as well as a tear of the subscapularis and supraspinatus tendons, Dr. Mardelle Matte  . Carpal tunnel syndrome   . Hemorrhoids   . Headache disorder 11/14/2014    Past Surgical History  Procedure Laterality Date  . Ankle surgery Left   . Esophagogastroduodenoscopy    . Tonsillectomy    . Varicocelectomy    . Left ankle mass removed  1972    3 surgeries on L ankle- 73, 2008, 2009  . Neck surgery  03/11,     C3-C7  . Back surgery      cerv. fusion C1- T1, 2 separate surgeries , T1-T3 2014  . Appendectomy      1964  . Cystoscopy      multiple times for infections  . Inguinal hernia  repair  03/27/2011    Procedure: HERNIA REPAIR INGUINAL ADULT;  Surgeon: Harl Bowie, MD;  Location: [place];  Service: General;  Laterality: Left;  . Carpal tunnel release Right   . Rectal surgery  at birth    "[facility]" SURGICAL CORRECTION  . Shoulder surgery  09/17/12  . Inguinal hernia repair Bilateral 12/09/2012    Procedure: LAPAROSCOPIC BILATERAL INGUINAL HERNIA REPAIR WITH MESH;  Surgeon: Harl Bowie, MD;  Location: [facility];  Service: General;  Laterality: Bilateral;  . Insertion of mesh Bilateral 12/09/2012    Procedure: INSERTION OF MESH;  Surgeon: Harl Bowie, MD;  Location: [facility];  Service: General;  Laterality: Bilateral;  . Ankle fusion Left 03-19-13    Dr Mardelle Matte  . Carpal tunnel release Left 07-14-2014    [place]  . Rotator cuff repair w/ distal clavicle excision Right 08/25/2014    And acromioplasty, Dr. Mardelle Matte    Family History  Problem Relation Age of Onset  . Stroke Mother   . Anesthesia problems Mother   . Cerebral aneurysm Father   . Skin cancer Father     melanoma  . Stroke Father   . Diabetes Sister     hypoglycemia  . Breast cancer Sister     x 2  . Lung cancer Sister   . Atrial fibrillation Sister   . Colon cancer Neg Hx   . Prostate cancer Neg Hx   . Lung cancer Sister   . Emphysema Sister   . Atrial fibrillation Sister     Social history:  reports that he has been smoking Cigarettes.  He has been smoking about 5.00 packs per day. He has never used smokeless tobacco. He reports that he does not drink alcohol or use illicit drugs.  Medications:  Prior to Admission medications   Medication Sig Start Date End Date Taking? Authorizing Provider  acetaminophen (TYLENOL) 500 MG tablet Take 500-1,000 mg by mouth every 4 (four) hours as needed. For pain   Yes Historical Provider, MD  albuterol (VENTOLIN HFA) 108 (90 BASE) MCG/ACT inhaler Inhale 2 puffs into the lungs every 6 (six) hours as needed for wheezing or shortness of breath.  03/29/14  Yes Colon Branch, MD  Blood Glucose Monitoring Suppl (ONE TOUCH ULTRA SYSTEM KIT) W/DEVICE KIT Use device to check blood sugar. 08/25/14  Yes Colon Branch, MD  diphenhydrAMINE (BENADRYL) 25 MG tablet Take 25 mg by mouth every 6 (six) hours as needed.   Yes Historical Provider, MD  DOCUSATE SODIUM PO Take by mouth.   Yes Historical Provider, MD  ENBREL SURECLICK 50 MG/ML injection Inject 25 mg as directed once a week.  10/10/14  Yes Historical Provider,  MD  esomeprazole (NEXIUM) 10 MG packet Take 10 mg by mouth daily before breakfast.   Yes Historical Provider, MD  etanercept (ENBREL) 50 MG/ML injection Inject 50 mg into the skin once a week.   Yes Historical Provider, MD  fluticasone (FLONASE) 50 MCG/ACT nasal spray Place 1 spray into both nostrils daily.   Yes Historical Provider, MD  folic acid (FOLVITE) 1 MG tablet Take 1 mg by mouth daily.   Yes Historical Provider, MD  gabapentin (NEURONTIN) 300 MG capsule Take 1 capsule (300 mg total) by mouth 5 (five) times daily. 08/10/14  Yes Colon Branch, MD  glucose blood (FREESTYLE LITE) test strip Check blood sugar no more than twice daily. 08/09/14  Yes Colon Branch, MD  hydrocortisone 2.5 % cream Apply 1 application topically 2 (two) times daily.   Yes Historical Provider, MD  Lancets (FREESTYLE) lancets Check blood sugars no more than twice daily. 08/09/14  Yes Colon Branch, MD  lidocaine (LIDODERM) 5 % Place 1 patch onto the skin daily. 07/05/14  Yes Colon Branch, MD  Menthol, Topical Analgesic, (ZIMS MAX-FREEZE) 3.7 % GEL Apply topically.   Yes Historical Provider, MD  metFORMIN (GLUCOPHAGE) 1000 MG tablet Take 1 tablet (1,000 mg total) by mouth 2 (two) times daily with a meal. 09/13/14  Yes Colon Branch, MD  Methotrexate, PF, 20 MG/0.4ML SOAJ Inject into the skin. Once a week   Yes Historical Provider, MD  MYRBETRIQ 50 MG TB24 tablet  07/05/13  Yes Historical Provider, MD  PROCTOSOL HC 2.5 % rectal cream  10/31/14  Yes Historical Provider, MD  Pseudoephedrine  HCl, Deter, 30 MG TABA Take 1 tablet by mouth as needed.   Yes Historical Provider, MD  Psyllium (METAMUCIL PO) Take by mouth.   Yes Historical Provider, MD  Simethicone (GAS-X PO) Take by mouth.   Yes Historical Provider, MD  simvastatin (ZOCOR) 20 MG tablet Take 1 tablet (20 mg total) by mouth at bedtime. 07/22/14  Yes Colon Branch, MD  Tamsulosin HCl (FLOMAX) 0.4 MG CAPS Take 0.4 mg by mouth daily after supper.    Yes Historical Provider, MD  fish oil-omega-3 fatty acids 1000 MG capsule Take 1 g by mouth 2 (two) times daily.     Historical Provider, MD      Allergies  Allergen Reactions  . Aspirin Anaphylaxis  . Codeine Phosphate Anaphylaxis  . Ibuprofen Anaphylaxis  . Menthol Swelling  . Monosodium Glutamate Diarrhea and Nausea And Vomiting    Other reaction(s): Cramps (ALLERGY/intolerance)  . Morphine Sulfate Anaphylaxis  . Naproxen Sodium Anaphylaxis  . Nsaids Anaphylaxis  . Other Itching    **BAND-AID**  . Sulfamethoxazole Anaphylaxis  . Levofloxacin Itching    ROS:  Out of a complete 14 system review of symptoms, the patient complains only of the following symptoms, and all other reviewed systems are negative.  Fatigue Blurred vision Ringing in the ears Birthmarks, moles Anemia, easy bruising, easy bleeding Increased thirst Joint pain, joint swelling, aching muscles Allergies Memory loss, headache, weakness, dizziness, passing out not enough sleep, decreased energy, anxiety Sleepiness, snoring   Blood pressure 121/62, pulse 103, height _0  (1.626 m), weight 132 lb 8 oz (60.102 kg).  Physical Exam  General: The patient is alert and cooperative at the time of the examination.  Eyes: Pupils are equal, round, and reactive to light. Discs are flat bilaterally.  Neck: The neck is supple, no carotid bruits are noted.  Respiratory: The respiratory examination is clear.  Cardiovascular: The cardiovascular examination reveals a regular rate and rhythm, no obvious  murmurs or rubs are noted.  Neuromuscular: The patient has severe loss of rotational movement of the cervical spine when turning to the right, as only about 10-20 of rotation. The patient has about 30-40 rotation to the left.  Skin: Extremities are with 1+ edema at the left ankle, unremarkable on the right.  Neurologic Exam  Mental status: The patient is alert and oriented x 3 at the time of the examination. The patient has apparent normal recent and remote memory, with an apparently normal attention span and concentration ability.  Cranial nerves: Facial symmetry is present. There is good sensation of the face to pinprick and soft touch bilaterally. The strength of the facial muscles and the muscles to head turning and shoulder shrug are normal bilaterally. Speech is well enunciated, no aphasia or dysarthria is noted. Extraocular movements are full. Visual fields are full. The tongue is midline, and the patient has symmetric elevation of the soft palate. No obvious hearing deficits are noted.  Motor: The motor testing reveals 5 over 5 strength of all 4 extremities. Good symmetric motor tone is noted throughout.  Sensory: Sensory testing is intact to pinprick, soft touch, vibration sensation, and position sense on all 4 extremities. No evidence of extinction is noted.  Coordination: Cerebellar testing reveals good finger-nose-finger and heel-to-shin bilaterally.Tinel's sign at the wrist is positive bilaterally.  Gait and station: Gait is normal. Tandem gait is slightly unsteady. Romberg is negative. No drift is seen.  Reflexes: Deep tendon reflexes are symmetric and normal bilaterally.Ankle jerk reflexes are maintained bilaterally, slightly less on the left.  Toes are downgoing bilaterally.   MRI cervical 04/09/14:   IMPRESSION: Normal appearance of the brain for age. Mild age related volume loss.  Fluid throughout the mastoid air cells on the left which could be symptomatic.  C1-2  osteoarthritis which could be symptomatic.  * MRI scan images were reviewed online. I agree with the written report.   Assessment/Plan:  1. Bilateral hand numbness, paresthesias, history of carpal tunnel syndrome   2. Diabetes   3. Rheumatoid arthritis   4. Left frontotemporal headache   5. History of syncope   6. Cervical spondylosis  The patient reports multiple medical issues at this time. He has had MRI evaluation of the brain done in April 2016 when he was having similar left frontotemporal headaches. This was unremarkable. The patient will be set up for a carotid Doppler study to exclude left carotid artery disease. He will be placed on Topamax for the headache. Blood work will be done today to include a sedimentation rate and CRP. The patient will be set up for nerve conduction studies on both arms, and one leg. EMG evaluation will be done on both arms. The patient does have a history of significant cervical spine disease. He will follow-up with EMG evaluation.    Jill Alexanders MD 11/14/2014 6:41 PM  [facility] [address] [place], [place] [address]  Phone [phone] Fax [phone]

## 2014-11-14 NOTE — Progress Notes (Signed)
Pre visit review using our clinic review tool, if applicable. No additional management support is needed unless otherwise documented below in the visit note. 

## 2014-11-15 ENCOUNTER — Encounter: Payer: Self-pay | Admitting: Internal Medicine

## 2014-11-15 ENCOUNTER — Other Ambulatory Visit (INDEPENDENT_AMBULATORY_CARE_PROVIDER_SITE_OTHER): Payer: BLUE CROSS/BLUE SHIELD

## 2014-11-15 ENCOUNTER — Telehealth: Payer: Self-pay

## 2014-11-15 ENCOUNTER — Other Ambulatory Visit: Payer: Self-pay | Admitting: Internal Medicine

## 2014-11-15 DIAGNOSIS — R197 Diarrhea, unspecified: Secondary | ICD-10-CM | POA: Diagnosis not present

## 2014-11-15 LAB — C-REACTIVE PROTEIN: CRP: 0.7 mg/L (ref 0.0–4.9)

## 2014-11-15 LAB — SEDIMENTATION RATE: SED RATE: 5 mm/h (ref 0–30)

## 2014-11-15 NOTE — Telephone Encounter (Signed)
Spoke to patient. Gave lab results. Patient verbalized understanding.  

## 2014-11-15 NOTE — Telephone Encounter (Signed)
-----   Message from Kathrynn Ducking, MD sent at 11/15/2014  7:18 AM EST -----  The blood work results are unremarkable. Please call the patient.  ----- Message -----    From: [facility] Lab Results In Interface    Sent: 11/15/2014   5:40 AM      To: Kathrynn Ducking, MD

## 2014-11-15 NOTE — Telephone Encounter (Signed)
Called patient with lab results. No answer. Will try later.

## 2014-11-15 NOTE — Addendum Note (Signed)
Addended by: Wilfrid Lund on: 11/15/2014 01:46 PM   Modules accepted: Orders

## 2014-11-16 ENCOUNTER — Ambulatory Visit (INDEPENDENT_AMBULATORY_CARE_PROVIDER_SITE_OTHER): Payer: BLUE CROSS/BLUE SHIELD

## 2014-11-16 ENCOUNTER — Telehealth: Payer: Self-pay

## 2014-11-16 DIAGNOSIS — R519 Headache, unspecified: Secondary | ICD-10-CM

## 2014-11-16 DIAGNOSIS — R51 Headache: Principal | ICD-10-CM

## 2014-11-16 DIAGNOSIS — R202 Paresthesia of skin: Secondary | ICD-10-CM | POA: Diagnosis not present

## 2014-11-16 LAB — C. DIFFICILE GDH AND TOXIN A/B
C. DIFF TOXIN A/B: NOT DETECTED
C. DIFFICILE GDH: NOT DETECTED

## 2014-11-16 LAB — FECAL LACTOFERRIN, QUANT: LACTOFERRIN: NEGATIVE

## 2014-11-16 LAB — HGB A1C W/O EAG: HEMOGLOBIN A1C: 6.8 % — AB (ref 4.8–5.6)

## 2014-11-16 LAB — SPECIMEN STATUS REPORT

## 2014-11-16 NOTE — Telephone Encounter (Signed)
-----   Message from Kathrynn Ducking, MD sent at 11/16/2014  7:28 AM EST -----  The blood work results are unremarkable. HgA1c is elevated, but improved from prior study. Please call the patient. ----- Message -----    From: [facility] Lab Results In Interface    Sent: 11/15/2014   5:40 AM      To: Kathrynn Ducking, MD

## 2014-11-16 NOTE — Telephone Encounter (Signed)
Spoke to patient. Gave lab results. Patient verbalized understanding.  

## 2014-11-17 ENCOUNTER — Ambulatory Visit (HOSPITAL_BASED_OUTPATIENT_CLINIC_OR_DEPARTMENT_OTHER)
Admission: RE | Admit: 2014-11-17 | Discharge: 2014-11-17 | Disposition: A | Discharge status: Home / Self Care | Payer: BLUE CROSS/BLUE SHIELD | Source: Ambulatory Visit | Attending: Internal Medicine | Admitting: Internal Medicine

## 2014-11-17 DIAGNOSIS — R938 Abnormal findings on diagnostic imaging of other specified body structures: Secondary | ICD-10-CM | POA: Diagnosis not present

## 2014-11-17 DIAGNOSIS — K7689 Other specified diseases of liver: Secondary | ICD-10-CM | POA: Diagnosis not present

## 2014-11-17 DIAGNOSIS — R932 Abnormal findings on diagnostic imaging of liver and biliary tract: Secondary | ICD-10-CM | POA: Diagnosis not present

## 2014-11-18 ENCOUNTER — Telehealth: Payer: Self-pay | Admitting: Internal Medicine

## 2014-11-18 DIAGNOSIS — K828 Other specified diseases of gallbladder: Secondary | ICD-10-CM

## 2014-11-18 NOTE — Telephone Encounter (Signed)
Advise patient:  So far, stool studies are negative. Ultrasound showed: --A fatty liver (we can discuss that when he comes back) --abnormalities in the gallbladder probably a condition called adenomyomatosis. Sometimes surgery is recommended. Arrange a referral to general surgery for consideration. Dx gallbladder adenomyomatosis --Pancreas was poorly visualized. Consider  repeat a CT of the abdomen at some time. An A1c was requested but not done. Please come back at his earliest convenience for a blood draw.

## 2014-11-18 NOTE — Telephone Encounter (Signed)
Caller name: Self  Can be reached: [phone]   Reason for call: Waiting for test result

## 2014-11-18 NOTE — Telephone Encounter (Signed)
Tried calling Pt, phone busy. Will try calling again once return on Monday.

## 2014-11-18 NOTE — Telephone Encounter (Signed)
Please advise. Pt calling for stool test results.

## 2014-11-19 ENCOUNTER — Telehealth: Payer: Self-pay | Admitting: Neurology

## 2014-11-19 NOTE — Telephone Encounter (Signed)
I called the patient. The carotid doppler study is OK. The patient indicates that the topamax seems to be helping.

## 2014-11-20 LAB — STOOL CULTURE

## 2014-11-21 NOTE — Telephone Encounter (Signed)
Tried calling Pt, phone again busy, general surgery referral placed.

## 2014-11-21 NOTE — Telephone Encounter (Signed)
Letter printed and mailed to Pt.  

## 2014-11-21 NOTE — Addendum Note (Signed)
Addended by: Wilfrid Lund on: 11/21/2014 03:24 PM   Modules accepted: Orders

## 2014-11-22 ENCOUNTER — Other Ambulatory Visit: Payer: Self-pay | Admitting: Internal Medicine

## 2014-11-22 NOTE — Telephone Encounter (Signed)
Rx sent 

## 2014-11-22 NOTE — Telephone Encounter (Signed)
Spoke with Pt, and his wife, informed them of stool study results, and ultrasound results. Informed them of general surgery referral. Multiple questions asked by them that I did not know answer to, informed them I did not know the answer and offered to schedule appt with Dr. Larose Kells, Pt and wife declined and decided to see general surgery. Informed them that CT from [place] also showed Adenomyomatosis and recommended cholecystectomy or surgery eval, which has finally been scanned into chart. Informed I would fax CT results to [facility] to go along with ultrasound results. Pt verbalized understanding.

## 2014-11-22 NOTE — Telephone Encounter (Signed)
Pt is requesting refill on Gabapentin.  Last OV: 11/14/2014 Last Fill: 08/10/2014 #450 and 0RF Pt takes 1 tablet five times daily.  Please advise.

## 2014-11-22 NOTE — Telephone Encounter (Signed)
Okay #450 no refills

## 2014-11-23 ENCOUNTER — Telehealth: Payer: Self-pay | Admitting: Internal Medicine

## 2014-11-23 NOTE — Telephone Encounter (Addendum)
Recommend to  see general surgery first , then order repeated CT if needed. Schedule a office visit to discussed if necessary

## 2014-11-23 NOTE — Telephone Encounter (Signed)
See phone note 11/18/2014 for details.

## 2014-11-23 NOTE — Telephone Encounter (Signed)
Pt called stating he spoke with [facility] 11/22/14 about repeat CT needed. He would like to get it done Thursday or Friday this week as they are going out of town. Please enter order and notify pt so he can get in if possible.

## 2014-11-23 NOTE — Telephone Encounter (Signed)
Margot Ables at 11/23/2014 12:34 PM     Status: Signed       Expand All Collapse All   Pt called stating he spoke with [facility] 11/22/14 about repeat CT needed. He would like to get it done Thursday or Friday this week as they are going out of town. Please enter order and notify pt so he can get in if possible.       Please advise.

## 2014-11-25 NOTE — Telephone Encounter (Signed)
Tried calling Pt, received "call can't be completed as dialed", hung up and tried call again, received same message.

## 2014-12-05 NOTE — Telephone Encounter (Signed)
Patient called confused what to do.  Advised him to see the general surgeon on Friday as he is scheduled and then if Dr Larose Kells feels he needs a repeat CT he will schedule.  Patinet voiced understanding

## 2014-12-08 ENCOUNTER — Ambulatory Visit (INDEPENDENT_AMBULATORY_CARE_PROVIDER_SITE_OTHER): Payer: BLUE CROSS/BLUE SHIELD | Admitting: Pulmonary Disease

## 2014-12-08 ENCOUNTER — Ambulatory Visit (HOSPITAL_BASED_OUTPATIENT_CLINIC_OR_DEPARTMENT_OTHER)
Admission: RE | Admit: 2014-12-08 | Discharge: 2014-12-08 | Disposition: A | Discharge status: Home / Self Care | Payer: BLUE CROSS/BLUE SHIELD | Source: Ambulatory Visit | Attending: Pulmonary Disease | Admitting: Pulmonary Disease

## 2014-12-08 ENCOUNTER — Encounter: Payer: Self-pay | Admitting: Pulmonary Disease

## 2014-12-08 VITALS — BP 128/75 | HR 91 | Temp 98.0°F | Ht 64.0 in | Wt 132.0 lb

## 2014-12-08 DIAGNOSIS — G4710 Hypersomnia, unspecified: Secondary | ICD-10-CM

## 2014-12-08 DIAGNOSIS — J209 Acute bronchitis, unspecified: Secondary | ICD-10-CM

## 2014-12-08 DIAGNOSIS — I517 Cardiomegaly: Secondary | ICD-10-CM | POA: Diagnosis not present

## 2014-12-08 DIAGNOSIS — R05 Cough: Secondary | ICD-10-CM | POA: Insufficient documentation

## 2014-12-08 DIAGNOSIS — J9811 Atelectasis: Secondary | ICD-10-CM | POA: Insufficient documentation

## 2014-12-08 MED ORDER — AMOXICILLIN-POT CLAVULANATE 875-125 MG PO TABS: 1.0000 | ORAL_TABLET | Freq: Two times a day (BID) | ORAL | Status: DC

## 2014-12-08 NOTE — Assessment & Plan Note (Addendum)
This is more likely related to medications went to sleep disorder breathing. He does not have symptoms of narcolepsy  Sleep study will be scheduled Try to decreae the dose of neurontin to 3 tabs daily Discus with Dr Cain Saupe about non sedating alternative such as LYRICA

## 2014-12-08 NOTE — Assessment & Plan Note (Signed)
Augmentin 875 twice daily x 7 days CXR today You have to [place] smoking !!

## 2014-12-08 NOTE — Patient Instructions (Signed)
Sleep study will be scheduled Try to decreae the dose of neurontin to 3 tabs daily Discus with Dr Cain Saupe about non sedating alternative such as LYRICA  Augmentin 875 twice daily x 7 days CXR today You have to [place] smoking !!

## 2014-12-08 NOTE — Progress Notes (Signed)
Subjective:    Patient ID: Christian Roberts, male    DOB: Nov 03, 1945, 69 y.o.   MRN: [id number]  HPI   Chief Complaint  Patient presents with  . Sleep Consult    Referred by Dr. Larose Kells; Has never had sleep study.  cough, chest congestion, yellow green mucus.  Epworth Score: 60   69 year old smoker is referred for evaluation of excessive daytime somnolence by Dr. Larose Kells . He also reports acute cough and congestion that started off as a cold. He seems to have contracted this from his son and his wife is also sick. He reports cough productive of green mucus, denies wheezing or fevers. Epworth sleepiness score is 23. He reports excessive sleepiness in the daytime while watching TV, he is also dozed off at a red light, he takes frequent naps in his chair. This was of fairly recent onset over the past year. Reports severe rheumatoid arthritis and is on Enbrel and methotrexate. He was started on gabapentin 5 times daily for at least 3 years after neck and back surgery. He also takes Topamax nightly for chronic headaches- Dr Jannifer Franklin from neurology.  Bedtime is around 11:30 PM, sleep onset is minimal latency, he reports at least 2-3 nocturnal awakenings, sleeps on his back with one pillow, occasional nocturia and is out of bed feeling tired with occasional dryness of mouth. He wakes up around 4:30 when his wife goes to work up her breakfast and then often dozes in his chair His wife has noted mild snoring but not witnessed apneas There is no history suggestive of cataplexy, sleep paralysis or parasomnias   He is an active smoker-more than 25 pack years He is now retired, used to work as an Energy manager  CXR -clear  Past Medical History  Diagnosis Date  . Allergic rhinitis   . GERD (gastroesophageal reflux disease)   . Hyperlipidemia   . Hernia   . Diabetes mellitus     2012  . Headache(784.0)     headaches-related to sinuses & uses Tylenol   . Skin cancer, basal cell      sees derm  . Osteoarthritis     ankle- left, hands & neck & hip  . Complication of anesthesia   . PONV (postoperative nausea and vomiting)     WITH ETHER  . Asthma     "I outgrew it"  . Numbness of fingers   . Constipation   . Nocturia   . Chronic rheumatic arthritis ([place]) 01/20/2014    Per Dr. Amil Amen   . Complete tear of rotator cuff 2016    Right, MRI in 2016, complete rupture of long head of the biceps as well as a tear of the subscapularis and supraspinatus tendons, Dr. Mardelle Matte  . Carpal tunnel syndrome   . Hemorrhoids   . Headache disorder 11/14/2014   Past Surgical History  Procedure Laterality Date  . Ankle surgery Left   . Esophagogastroduodenoscopy    . Tonsillectomy    . Varicocelectomy    . Left ankle mass removed  1972    3 surgeries on L ankle- 73, 2008, 2009  . Neck surgery  03/11,     C3-C7  . Back surgery      cerv. fusion C1- T1, 2 separate surgeries , T1-T3 2014  . Appendectomy      1964  . Cystoscopy      multiple times for infections  . Inguinal hernia repair  03/27/2011  Procedure: HERNIA REPAIR INGUINAL ADULT;  Surgeon: Harl Bowie, MD;  Location: [place];  Service: General;  Laterality: Left;  . Carpal tunnel release Right   . Rectal surgery  at birth    "[place]" SURGICAL CORRECTION  . Shoulder surgery  09/17/12  . Inguinal hernia repair Bilateral 12/09/2012    Procedure: LAPAROSCOPIC BILATERAL INGUINAL HERNIA REPAIR WITH MESH;  Surgeon: Harl Bowie, MD;  Location: [facility];  Service: General;  Laterality: Bilateral;  . Insertion of mesh Bilateral 12/09/2012    Procedure: INSERTION OF MESH;  Surgeon: Harl Bowie, MD;  Location: [facility];  Service: General;  Laterality: Bilateral;  . Ankle fusion Left 03-19-13    Dr Mardelle Matte  . Carpal tunnel release Left 07-14-2014    [facility]  . Rotator cuff repair w/ distal clavicle excision Right 08/25/2014    And acromioplasty, Dr. Mardelle Matte   Allergies  Allergen Reactions  . Aspirin  Anaphylaxis  . Codeine Phosphate Anaphylaxis  . Ibuprofen Anaphylaxis  . Levofloxacin Itching and Anaphylaxis  . Menthol Swelling  . Monosodium Glutamate Diarrhea and Nausea And Vomiting    Other reaction(s): Cramps (ALLERGY/intolerance)  . Morphine Sulfate Anaphylaxis  . Naproxen Sodium Anaphylaxis  . Nsaids Anaphylaxis  . Other Itching    **BAND-AID**  . Sulfamethoxazole Anaphylaxis    Social History   Social History  . Marital Status: Married    Spouse Name: Arbie Cookey  . Number of Children: 3  . Years of Education: BA+   Occupational History  . 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 Smoking status: Current Every Day Smoker -- 5.00 packs/day    Types: Cigarettes    Last Attempt to Quit: 04/10/2012  . Smokeless tobacco: Never Used     Comment: quit on-off since 2014   . Alcohol Use: No  . Drug Use: No  . Sexual Activity: Not on file   Other Topics Concern  . Not on file   Social History Narrative   Married , lives w/ wife   Caffeine Use: 7-8 cups daily    Family History  Problem Relation Age of Onset  . Stroke Mother   . Anesthesia problems Mother   . Cerebral aneurysm Father   . Skin cancer Father     melanoma  . Stroke Father   . Diabetes Sister     hypoglycemia  . Breast cancer Sister     x 2  . Lung cancer Sister   . Atrial fibrillation Sister   . Colon cancer Neg Hx   . Prostate cancer Neg Hx   . Lung cancer Sister   . Emphysema Sister   . Atrial fibrillation Sister      Review of Systems  Constitutional: Negative for fever, chills, activity change, appetite change and unexpected weight change.  HENT: Negative for congestion, dental problem, postnasal drip, rhinorrhea, sneezing, sore throat, trouble swallowing and voice change.   Eyes: Negative for visual disturbance.  Respiratory: Negative for cough, choking and shortness of breath.   Cardiovascular: Negative for chest pain and leg swelling.  Gastrointestinal: Negative for  nausea, vomiting and abdominal pain.  Genitourinary: Negative for difficulty urinating.  Musculoskeletal: Negative for arthralgias.  Skin: Negative for rash.  Psychiatric/Behavioral: Negative for behavioral problems and confusion.       Objective:   Physical Exam  Gen. Pleasant, well-nourished, in no distress, normal affect ENT - no lesions, no post nasal drip Neck: No JVD, no thyromegaly, no  carotid bruits Lungs: no use of accessory muscles, no dullness to percussion, decreased without rales or rhonchi  Cardiovascular: Rhythm regular, heart sounds  normal, no murmurs or gallops, no peripheral edema Abdomen: soft and non-tender, no hepatosplenomegaly, BS normal. Musculoskeletal: No deformities, no cyanosis or clubbing Neuro:  alert, non focal       Assessment & Plan:

## 2014-12-09 ENCOUNTER — Other Ambulatory Visit: Payer: Self-pay | Admitting: Surgery

## 2014-12-13 ENCOUNTER — Other Ambulatory Visit: Payer: Self-pay | Admitting: Surgery

## 2014-12-13 DIAGNOSIS — R109 Unspecified abdominal pain: Secondary | ICD-10-CM

## 2014-12-15 ENCOUNTER — Telehealth: Payer: Self-pay | Admitting: Pulmonary Disease

## 2014-12-15 MED ORDER — DOXYCYCLINE HYCLATE 100 MG PO TABS: 100.0000 mg | ORAL_TABLET | Freq: Every day | ORAL | Status: DC

## 2014-12-15 NOTE — Telephone Encounter (Signed)
Spoke with pt. Aware of recs below. RX sent in. Nothing further needed

## 2014-12-15 NOTE — Telephone Encounter (Signed)
Spoke with pt and notified of recs per RA  He states unable to take levaquin due to anaphylaxis  He does okay with augmentin, just finished 7 day course 1 day ago  Please advise, thanks Allergies  Allergen Reactions  . Aspirin Anaphylaxis  . Codeine Phosphate Anaphylaxis  . Ibuprofen Anaphylaxis  . Levofloxacin Itching and Anaphylaxis  . Menthol Swelling  . Monosodium Glutamate Diarrhea and Nausea And Vomiting    Other reaction(s): Cramps (ALLERGY/intolerance)  . Morphine Sulfate Anaphylaxis  . Naproxen Sodium Anaphylaxis  . Nsaids Anaphylaxis  . Other Itching    **BAND-AID**  . Sulfamethoxazole Anaphylaxis

## 2014-12-15 NOTE — Telephone Encounter (Signed)
Spoke with pt, states he is still coughing up yellow mucus, sinus congestion. (s/s present almost 2 weeks) Denies chest pain, fever, nausea.   Pt has taken robitussin to help with s/s.   Pt also wants to know if he is ok to take his Embrel injection-due to take this today.   Pt also requesting cxr results from last week.    [facility] uses [facility] [address].    RA please advise.  Thanks!

## 2014-12-15 NOTE — Telephone Encounter (Signed)
Doxy 100 daily x 7d s 

## 2014-12-15 NOTE — Telephone Encounter (Signed)
No evidence of pneumonia on chest x-ray Hold off Enbrel for a few days until he feels better Levaquin 500 daily x 7 days

## 2014-12-20 ENCOUNTER — Ambulatory Visit
Admission: RE | Admit: 2014-12-20 | Discharge: 2014-12-20 | Disposition: A | Discharge status: Home / Self Care | Payer: BLUE CROSS/BLUE SHIELD | Source: Ambulatory Visit | Attending: Surgery | Admitting: Surgery

## 2014-12-20 DIAGNOSIS — R109 Unspecified abdominal pain: Secondary | ICD-10-CM

## 2014-12-20 MED ORDER — IOPAMIDOL (ISOVUE-300) INJECTION 61%
100.0000 mL | Freq: Once | INTRAVENOUS | Status: AC | PRN
  Administered 2014-12-20: 100 mL via INTRAVENOUS

## 2014-12-26 ENCOUNTER — Ambulatory Visit (INDEPENDENT_AMBULATORY_CARE_PROVIDER_SITE_OTHER): Payer: BLUE CROSS/BLUE SHIELD | Admitting: Neurology

## 2014-12-26 ENCOUNTER — Encounter: Payer: Self-pay | Admitting: Neurology

## 2014-12-26 ENCOUNTER — Ambulatory Visit (INDEPENDENT_AMBULATORY_CARE_PROVIDER_SITE_OTHER): Payer: Self-pay | Admitting: Neurology

## 2014-12-26 DIAGNOSIS — G5601 Carpal tunnel syndrome, right upper limb: Secondary | ICD-10-CM

## 2014-12-26 DIAGNOSIS — G5600 Carpal tunnel syndrome, unspecified upper limb: Secondary | ICD-10-CM | POA: Insufficient documentation

## 2014-12-26 DIAGNOSIS — G5602 Carpal tunnel syndrome, left upper limb: Secondary | ICD-10-CM | POA: Diagnosis not present

## 2014-12-26 DIAGNOSIS — R202 Paresthesia of skin: Secondary | ICD-10-CM

## 2014-12-26 DIAGNOSIS — G5603 Carpal tunnel syndrome, bilateral upper limbs: Secondary | ICD-10-CM

## 2014-12-26 NOTE — Progress Notes (Signed)
The patient comes in for EMG and nerve conduction study evaluation. The studies show evidence of bilateral carpal tunnel syndrome, a chronic stable C7 radiculopathy on the left.   The patient indicates that his headaches are much better on Topamax at 75 mg daily. He will follow-up in 3 months.

## 2014-12-26 NOTE — Procedures (Signed)
HISTORY:  Christian Roberts is a 69 year old gentleman with a history of rheumatoid arthritis, and 3 prior cervical spine surgeries. The patient has had bilateral carpal tunnel syndrome, with surgery on the right was done 2 years ago, the left was done in 6-8 months ago. The patient continues to have ongoing numbness in the hands, some weakness in the hands. He is being evaluated for these ongoing symptoms.  NERVE CONDUCTION STUDIES:  Nerve conduction studies were performed on both upper extremities. The distal motor latencies for the median nerves were prolonged bilaterally, with a low motor amplitude on the left, normal on the right. The distal motor latencies and motor amplitudes for the ulnar nerves were normal bilaterally. The F wave latencies and nerve conduction velocities for the median and ulnar nerves within normal limits bilaterally. The sensory latencies for the median nerves were prolonged bilaterally, normal for the ulnar nerves bilaterally.  Nerve conduction studies were performed on the left lower extremity. The distal motor latencies and motor amplitudes for the peroneal and posterior tibial nerves were within normal limits. The nerve conduction velocities for these nerves were also normal. The H reflex latency was normal. The sensory latency for the peroneal nerve was within normal limits.   EMG STUDIES:  EMG study was performed on the left upper extremity:  The first dorsal interosseous muscle reveals 2 to 4 K units with full recruitment. No fibrillations or positive waves were noted. The abductor pollicis brevis muscle reveals 2 to 4 K units with decreased recruitment.  1 plus fibrillations  and positive waves were noted. The extensor indicis proprius muscle reveals 1 to 3 K units with full recruitment. No fibrillations or positive waves were noted. The pronator teres muscle reveals 2 to 3 K units with slightly decreased recruitment. No fibrillations or positive waves were  noted. The biceps muscle reveals 1 to 2 K units with full recruitment. No fibrillations or positive waves were noted. The triceps muscle reveals 2 to 6 K units with decreased recruitment. No fibrillations or positive waves were noted. The anterior deltoid muscle reveals 2 to 3 K units with full recruitment. No fibrillations or positive waves were noted. The cervical paraspinal muscles were tested at 2 levels. No abnormalities of insertional activity were seen at either level tested. There was good relaxation.  EMG study was performed on the right upper extremity:  The first dorsal interosseous muscle reveals 2 to 4 K units with full recruitment. No fibrillations or positive waves were noted. The abductor pollicis brevis muscle reveals 2 to 6 K units with moderately decreased recruitment. No fibrillations or positive waves were noted. The extensor indicis proprius muscle reveals 1 to 3 K units with full recruitment. No fibrillations or positive waves were noted. The pronator teres muscle reveals 2 to 3 K units with full recruitment. No fibrillations or positive waves were noted. The biceps muscle reveals 1 to 2 K units with full recruitment. No fibrillations or positive waves were noted. The triceps muscle reveals 2 to 4 K units with full recruitment. No fibrillations or positive waves were noted. The anterior deltoid muscle reveals 2 to 3 K units with full recruitment. No fibrillations or positive waves were noted. The cervical paraspinal muscles were not tested.   IMPRESSION:  Nerve conduction studies done on both upper extremities and on the left lower extremity shows evidence of bilateral carpal tunnel syndrome of moderate severity on the left, mild severity on the right. The studies show no evidence of  an underlying peripheral neuropathy. EMG evaluation of the left upper extremity shows acute and chronic denervation of the APB muscle consistent with carpal tunnel syndrome, with evidence of an  overlying mild chronic stable C7 radiculopathy. EMG evaluation of the right upper extremity shows primarily chronic denervation of the APB the muscle consistent with the history of carpal tunnel syndrome.  Jill Alexanders MD 12/26/2014 4:45 PM  [facility] [address] [place], [place] [address]  Phone [phone] Fax [phone]

## 2015-01-03 ENCOUNTER — Other Ambulatory Visit: Payer: Self-pay

## 2015-01-15 ENCOUNTER — Ambulatory Visit (HOSPITAL_BASED_OUTPATIENT_CLINIC_OR_DEPARTMENT_OTHER): Payer: BLUE CROSS/BLUE SHIELD

## 2015-01-24 ENCOUNTER — Other Ambulatory Visit: Payer: Self-pay | Admitting: Internal Medicine

## 2015-01-26 ENCOUNTER — Encounter: Payer: Self-pay | Admitting: Internal Medicine

## 2015-01-26 ENCOUNTER — Encounter: Payer: BLUE CROSS/BLUE SHIELD | Admitting: Internal Medicine

## 2015-01-26 ENCOUNTER — Ambulatory Visit (INDEPENDENT_AMBULATORY_CARE_PROVIDER_SITE_OTHER): Payer: BLUE CROSS/BLUE SHIELD | Admitting: Internal Medicine

## 2015-01-26 VITALS — BP 118/74 | HR 90 | Temp 97.9°F | Ht 64.0 in | Wt 128.5 lb

## 2015-01-26 DIAGNOSIS — E785 Hyperlipidemia, unspecified: Secondary | ICD-10-CM | POA: Diagnosis not present

## 2015-01-26 DIAGNOSIS — E119 Type 2 diabetes mellitus without complications: Secondary | ICD-10-CM

## 2015-01-26 DIAGNOSIS — K828 Other specified diseases of gallbladder: Secondary | ICD-10-CM

## 2015-01-26 DIAGNOSIS — Z0000 Encounter for general adult medical examination without abnormal findings: Secondary | ICD-10-CM | POA: Diagnosis not present

## 2015-01-26 DIAGNOSIS — R634 Abnormal weight loss: Secondary | ICD-10-CM

## 2015-01-26 DIAGNOSIS — D649 Anemia, unspecified: Secondary | ICD-10-CM | POA: Diagnosis not present

## 2015-01-26 LAB — BASIC METABOLIC PANEL
BUN: 19 mg/dL (ref 6–23)
CHLORIDE: 107 meq/L (ref 96–112)
CO2: 27 mEq/L (ref 19–32)
Calcium: 9.7 mg/dL (ref 8.4–10.5)
Creatinine, Ser: 0.98 mg/dL (ref 0.40–1.50)
GFR: 80.78 mL/min (ref >60.00)
Glucose, Bld: 133 mg/dL — ABNORMAL HIGH (ref 70–99)
POTASSIUM: 4.2 meq/L (ref 3.5–5.1)
Sodium: 142 mEq/L (ref 135–145)

## 2015-01-26 LAB — CBC WITH DIFFERENTIAL/PLATELET
BASOS ABS: 0.1 10*3/uL (ref 0.0–0.1)
Basophils Relative: 0.7 % (ref 0.0–3.0)
EOS ABS: 0.2 10*3/uL (ref 0.0–0.7)
Eosinophils Relative: 2.9 % (ref 0.0–5.0)
HCT: 38.3 % — ABNORMAL LOW (ref 39.0–52.0)
Hemoglobin: 12.4 g/dL — ABNORMAL LOW (ref 13.0–17.0)
LYMPHS ABS: 2.2 10*3/uL (ref 0.7–4.0)
Lymphocytes Relative: 30.6 % (ref 12.0–46.0)
MCHC: 32.5 g/dL (ref 30.0–36.0)
MCV: 88.3 fl (ref 78.0–100.0)
Monocytes Absolute: 0.8 10*3/uL (ref 0.1–1.0)
Monocytes Relative: 11.2 % (ref 3.0–12.0)
NEUTROS ABS: 3.9 10*3/uL (ref 1.4–7.7)
NEUTROS PCT: 54.6 % (ref 43.0–77.0)
PLATELETS: 347 10*3/uL (ref 150.0–400.0)
RBC: 4.34 Mil/uL (ref 4.22–5.81)
RDW: 15.2 % (ref 11.5–15.5)
WBC: 7.2 10*3/uL (ref 4.0–10.5)

## 2015-01-26 LAB — LIPID PANEL
CHOLESTEROL: 155 mg/dL (ref 0–200)
HDL: 57.1 mg/dL (ref >39.00)
LDL CALC: 82 mg/dL (ref 0–99)
NonHDL: 97.79
Total CHOL/HDL Ratio: 3
Triglycerides: 79 mg/dL (ref 0.0–149.0)
VLDL: 15.8 mg/dL (ref 0.0–40.0)

## 2015-01-26 LAB — HEPATITIS C ANTIBODY: HCV AB: NEGATIVE

## 2015-01-26 LAB — FERRITIN: FERRITIN: 34.2 ng/mL (ref 22.0–322.0)

## 2015-01-26 LAB — IRON: Iron: 41 ug/dL — ABNORMAL LOW (ref 42–165)

## 2015-01-26 MED ORDER — METFORMIN HCL 1000 MG PO TABS: 1000.0000 mg | ORAL_TABLET | Freq: Two times a day (BID) | ORAL | Status: DC

## 2015-01-26 NOTE — Patient Instructions (Addendum)
BEFORE YOU LEAVE THE OFFICE:  GO TO THE LAB  Get the blood work    GO TO THE FRONT DESK  Schedule a routine office visit or check up to be done in  3-4 months, no fasting      Also   see one of our nurses for Medicare wellness exam

## 2015-01-26 NOTE — Assessment & Plan Note (Addendum)
Td 2009; Zostavax 2013;Pneumonia shot 2013;Prevnar 2015 Had a flu shot  Colonoscopy 2002, diverticuli, random biopsies were done. Flex sigmoidoscopy 2006, + hemorrhoids, Dr. Deatra Ina Pt will call and set up a cscope  Recommend DEXA, he will check with dermatologist to see if that has been done already.  Diet and exercise discussed

## 2015-01-26 NOTE — Progress Notes (Signed)
Subjective:    Patient ID: Christian Roberts, male    DOB: 06/12/1945, 70 y.o.   MRN: [id number]  DOS:  01/26/2015 Type of visit - description : CPX Interval history: DM: Good compliance with metformin, no concerns. High cholesterol: On simvastatin, aches and pains at baseline OSA? Last month, he was very sleepy, saw pulmonary: it was felt to be due to gabapentin, dose decreased, he is now feeling better. Sleep study was Rx but never done. Saw surgery regardless an abnormal CT of the gallbladder, CT was repeated, patient was told that he looks stable and no surgery was needed at this time. Was seen with diarrhea 11-2014: Symptoms resolved   Wt Readings from Last 3 Encounters:  01/26/15 128 lb 8 oz (58.287 kg)  12/08/14 132 lb (59.875 kg)  11/14/14 131 lb 12.8 oz (59.784 kg)    Review of Systems  ROS essentially negative, no new concerns. Constitutional: No fever. No chills. No unexplained wt changes. No unusual sweats  HEENT: No dental problems, no ear discharge, no facial swelling, no voice changes. No eye discharge, no eye  redness , no  intolerance to light   Respiratory: No wheezing , no  difficulty breathing. No cough , no mucus production  Cardiovascular: No CP, no leg swelling , no  Palpitations  GI: no nausea, no vomiting, no diarrhea , no  abdominal pain.  No blood in the stools. No dysphagia, no odynophagia    Endocrine: No polyphagia, no polyuria , no polydipsia  GU: No dysuria, gross hematuria, difficulty urinating. No urinary urgency, no frequency.  Musculoskeletal: No joint swellings or unusual aches or pains  Skin: No change in the color of the skin, palor , no  Rash  Allergic, immunologic: No environmental allergies , no  food allergies  Neurological: No dizziness no  syncope. No headaches. No diplopia, no slurred, no slurred speech, no motor deficits, no facial  Numbness  Hematological: No enlarged lymph nodes, no easy bruising , no unusual  bleedings  Psychiatry: No suicidal ideas, no hallucinations, no beavior problems, no confusion.  No unusual/severe anxiety, no depression   Past Medical History  Diagnosis Date  . Allergic rhinitis   . GERD (gastroesophageal reflux disease)   . Hyperlipidemia   . Hernia   . Diabetes mellitus     2012  . Headache(784.0)     headaches-related to sinuses & uses Tylenol   . Skin cancer, basal cell     sees derm  . Osteoarthritis     ankle- left, hands & neck & hip  . Complication of anesthesia   . PONV (postoperative nausea and vomiting)     WITH ETHER  . Asthma     "I outgrew it"  . Numbness of fingers   . Constipation   . Nocturia   . Chronic rheumatic arthritis ([place]) 01/20/2014    Per Dr. Amil Amen   . Complete tear of rotator cuff 2016    Right, MRI in 2016, complete rupture of long head of the biceps as well as a tear of the subscapularis and supraspinatus tendons, Dr. Mardelle Matte  . Carpal tunnel syndrome   . Hemorrhoids   . Headache disorder 11/14/2014    Past Surgical History  Procedure Laterality Date  . Ankle surgery Left   . Esophagogastroduodenoscopy    . Tonsillectomy    . Varicocelectomy    . Left ankle mass removed  1972    3 surgeries on L ankle- 73, 2008, 2009  .  Neck surgery  03/11,     C3-C7  . Back surgery      cerv. fusion C1- T1, 2 separate surgeries , T1-T3 2014  . Appendectomy      1964  . Cystoscopy      multiple times for infections  . Inguinal hernia repair  03/27/2011    Procedure: HERNIA REPAIR INGUINAL ADULT;  Surgeon: Harl Bowie, MD;  Location: [facility];  Service: General;  Laterality: Left;  . Carpal tunnel release Right   . Rectal surgery  at birth    "[place]" SURGICAL CORRECTION  . Shoulder surgery  09/17/12  . Inguinal hernia repair Bilateral 12/09/2012    Procedure: LAPAROSCOPIC BILATERAL INGUINAL HERNIA REPAIR WITH MESH;  Surgeon: Harl Bowie, MD;  Location: [facility];  Service: General;  Laterality: Bilateral;   . Insertion of mesh Bilateral 12/09/2012    Procedure: INSERTION OF MESH;  Surgeon: Harl Bowie, MD;  Location: [facility];  Service: General;  Laterality: Bilateral;  . Ankle fusion Left 03-19-13    Dr Mardelle Matte  . Carpal tunnel release Left 07-14-2014    [facility]  . Rotator cuff repair w/ distal clavicle excision Right 08/25/2014    And acromioplasty, Dr. Mardelle Matte    Social History   Social History  . Marital Status: Married    Spouse Name: Arbie Cookey  . Number of Children: 3  . Years of Education: BA+   Occupational History  . 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 Smoking status: Current Every Day Smoker -- 5.00 packs/day    Types: Cigarettes    Last Attempt to Quit: 04/10/2012  . Smokeless tobacco: Never Used     Comment: quit on-off since 2014   . Alcohol Use: No  . Drug Use: No  . Sexual Activity: Not on file   Other Topics Concern  . Not on file   Social History Narrative   Married , lives w/ wife           Medication List       This list is accurate as of: 01/26/15 11:59 PM.  Always use your most recent med list.               acetaminophen 500 MG tablet  Commonly known as:  TYLENOL  Take 500-1,000 mg by mouth every 4 (four) hours as needed. For pain     albuterol 108 (90 Base) MCG/ACT inhaler  Commonly known as:  VENTOLIN HFA  Inhale 2 puffs into the lungs every 6 (six) hours as needed for wheezing or shortness of breath.     BENADRYL 25 MG tablet  Generic drug:  diphenhydrAMINE  Take 25 mg by mouth every 6 (six) hours as needed.     DOCUSATE SODIUM PO  Take by mouth. Reported on 01/26/2015     doxycycline 100 MG tablet  Commonly known as:  VIBRA-TABS  Take 1 tablet (100 mg total) by mouth daily.     ENBREL 50 MG/ML injection  Generic drug:  etanercept  Inject 50 mg into the skin once a week.     esomeprazole 10 MG packet  Commonly known as:  NEXIUM  Take 10 mg by mouth daily before breakfast.     fish oil-omega-3 fatty acids  1000 MG capsule  Take 1 g by mouth 2 (two) times daily.     fluticasone 50 MCG/ACT nasal spray  Commonly known as:  FLONASE  Place 1 spray  into both nostrils daily.     folic acid 1 MG tablet  Commonly known as:  FOLVITE  Take 1 mg by mouth daily.     freestyle lancets  Check blood sugars no more than twice daily.     gabapentin 300 MG capsule  Commonly known as:  NEURONTIN  Take 300 mg by mouth 2 (two) times daily.     GAS-X PO  Take by mouth. Reported on 01/26/2015     ACCU-CHEK SMARTVIEW test strip  Generic drug:  glucose blood  Reported on 01/26/2015     glucose blood test strip  Commonly known as:  FREESTYLE LITE  Check blood sugar no more than twice daily.     hydrocortisone 2.5 % cream  Apply 1 application topically 2 (two) times daily. Reported on 01/26/2015     lidocaine 5 %  Commonly known as:  LIDODERM  Place 1 patch onto the skin daily.     METAMUCIL PO  Take by mouth.     metFORMIN 1000 MG tablet  Commonly known as:  GLUCOPHAGE  Take 1 tablet (1,000 mg total) by mouth 2 (two) times daily with a meal.     Methotrexate (PF) 20 MG/0.4ML Soaj  Inject into the skin. Once a week     MYRBETRIQ 50 MG Tb24 tablet  Generic drug:  mirabegron ER     ONE TOUCH ULTRA SYSTEM KIT w/Device Kit  Use device to check blood sugar.     PROCTOSOL HC 2.5 % rectal cream  Generic drug:  hydrocortisone  Reported on 01/26/2015     Pseudoephedrine HCl (Deter) 30 MG Taba  Take 1 tablet by mouth as needed. Reported on 01/26/2015     simvastatin 20 MG tablet  Commonly known as:  ZOCOR  Take 1 tablet (20 mg total) by mouth at bedtime.     tamsulosin 0.4 MG Caps capsule  Commonly known as:  FLOMAX  Take 0.4 mg by mouth daily after supper.     topiramate 25 MG tablet  Commonly known as:  TOPAMAX  Take one tablet at night for one week, then take 2 tablets at night for one week, then take 3 tablets at night.     ZIMS MAX-FREEZE 3.7 % Gel  Generic drug:  Menthol (Topical  Analgesic)  Apply topically.           Objective:   Physical Exam BP 118/74 mmHg  Pulse 90  Temp(Src) 97.9 F (36.6 C) (Oral)  Ht 5' 4"  (1.626 m)  Wt 128 lb 8 oz (58.287 kg)  BMI 22.05 kg/m2  SpO2 97% General:   Well developed, well nourished . NAD.  HEENT:  Normocephalic . Face symmetric, atraumatic X line neck: No thyromegaly, normal carotid pulses Lungs:  CTA B Normal respiratory effort, no intercostal retractions, no accessory muscle use. Heart: RRR,  no murmur.  no pretibial edema bilaterally  Abdomen:  Not distended, soft, non-tender. No rebound or rigidity. No mass,organomegaly Skin: Not pale. Not jaundice Neurologic:  alert & oriented X3.  Speech normal, gait appropriate for age and unassisted Psych--  Cognition and judgment appear intact.  Cooperative with normal attention span and concentration.  Behavior appropriate. No anxious or depressed appearing.    Assessment & Plan:   Assessment > DM 2012 Hyperlipidemia zocor  GERD Abnormal gallbladder per CT-- Saw surgery 12-16: No surgery rx Rheumatoid arthritis -- on enbrel - MTX DJD multiple sites, cervical spondylosis -- gabapentin CTS Headaches --topamax GU: sees urology --Elevated PSA --  BPH Skin cancer, BCC  Plan: DM: Last a1c satisfactory, refill metformin Hyperlipidemia: Continue Zocor, check FLP. Last LFTs normal Abnormal CT of gallbladder, Liver and pancreatic cysts?: Saw surgery, CT was repeated, gallbladder appearance was stable, no cyst per last CT. was told no surgery needed Diarrhea: Resolved Mild anemia-- recheck labs  Mild weight loss: Reassess and return to the office  RTC 3-4 months

## 2015-01-26 NOTE — Progress Notes (Signed)
Pre visit review using our clinic review tool, if applicable. No additional management support is needed unless otherwise documented below in the visit note. 

## 2015-01-30 ENCOUNTER — Telehealth: Payer: Self-pay | Admitting: Internal Medicine

## 2015-01-30 NOTE — Telephone Encounter (Signed)
Caller name: Self  Can be reached: [phone] Pharmacy: [place], [place] [phone] (Phone) [phone] (Fax)        Reason for call: Request refill on

## 2015-02-14 ENCOUNTER — Ambulatory Visit: Payer: BLUE CROSS/BLUE SHIELD | Admitting: Internal Medicine

## 2015-03-03 ENCOUNTER — Telehealth: Payer: Self-pay | Admitting: Internal Medicine

## 2015-03-03 NOTE — Telephone Encounter (Signed)
It was too early to check, we can recheck when he RTC but if he likes to get a A1C sooner, please arrange for next week

## 2015-03-03 NOTE — Telephone Encounter (Signed)
Please advise 

## 2015-03-03 NOTE — Telephone Encounter (Signed)
Pt called to see if he needs to have A1C checked since it wasn't done at last OV. Please call pt as he states the main purpose of his last visit was to check A1C to check diabetes to see if changes were needed. Ph # [phone].

## 2015-03-06 NOTE — Telephone Encounter (Signed)
Tried calling Pt, unable to get through, busy signal. Will try again later.

## 2015-03-07 NOTE — Telephone Encounter (Signed)
Tried calling again, phone just rang, no answer, no voicemail. Will discuss w/ Pt at next OV.

## 2015-03-22 ENCOUNTER — Ambulatory Visit: Payer: BLUE CROSS/BLUE SHIELD | Admitting: Neurology

## 2015-03-28 ENCOUNTER — Other Ambulatory Visit: Payer: Self-pay | Admitting: Neurology

## 2015-04-06 ENCOUNTER — Other Ambulatory Visit: Payer: Self-pay

## 2015-04-06 MED ORDER — SIMVASTATIN 20 MG PO TABS: 20.0000 mg | ORAL_TABLET | Freq: Every day | ORAL | Status: DC

## 2015-04-13 ENCOUNTER — Ambulatory Visit: Payer: BLUE CROSS/BLUE SHIELD | Admitting: Internal Medicine

## 2015-04-18 ENCOUNTER — Telehealth: Payer: Self-pay | Admitting: Internal Medicine

## 2015-04-18 NOTE — Telephone Encounter (Signed)
Call patient to inform him of below. Patient verbalized understanding.

## 2015-04-18 NOTE — Telephone Encounter (Signed)
Caller name: Self  Can be reached: [phone]   Reason for call: Patient wants to know if he is due to have an A1C done. AVS from last visit states No Fasting. Rescheduled appt to 5/17. Plse adv

## 2015-04-18 NOTE — Telephone Encounter (Signed)
Yes, will check a A1C when he comes back, no need to be fasting

## 2015-04-18 NOTE — Telephone Encounter (Signed)
Please advise 

## 2015-04-19 ENCOUNTER — Ambulatory Visit: Payer: BLUE CROSS/BLUE SHIELD | Admitting: Internal Medicine

## 2015-04-24 ENCOUNTER — Other Ambulatory Visit: Payer: Self-pay

## 2015-04-24 MED ORDER — SIMVASTATIN 20 MG PO TABS: 20.0000 mg | ORAL_TABLET | Freq: Every day | ORAL | Status: DC

## 2015-04-26 ENCOUNTER — Encounter: Payer: BLUE CROSS/BLUE SHIELD | Admitting: Internal Medicine

## 2015-05-10 ENCOUNTER — Telehealth: Payer: Self-pay | Admitting: Internal Medicine

## 2015-05-10 MED ORDER — METFORMIN HCL 1000 MG PO TABS: 1000.0000 mg | ORAL_TABLET | Freq: Two times a day (BID) | ORAL | Status: DC

## 2015-05-10 NOTE — Telephone Encounter (Signed)
Relation to [id number] Call back number:[phone] Pharmacy: [facility]  [place], [place], [place] [address] [phone]   Reason for call:  Patient requesting a refill metFORMIN (GLUCOPHAGE) 1000 MG tablet

## 2015-05-10 NOTE — Telephone Encounter (Signed)
Rx sent to the pharmacy by e-script.//AB/CMA 

## 2015-05-11 MED ORDER — METFORMIN HCL 1000 MG PO TABS: 1000.0000 mg | ORAL_TABLET | Freq: Two times a day (BID) | ORAL | Status: DC

## 2015-05-11 NOTE — Telephone Encounter (Signed)
Pt called stating [facility] does not have RX for metformin. Per Med chart it was sent to [facility] in error. Pt did not have for this morning dose. Please send to [facility] [place], [place], [place] [address] [phone] today.

## 2015-05-11 NOTE — Telephone Encounter (Signed)
Rx stopped at [facility]. New rx sent to [facility] on [address] per pt request. Pt notified by clinic scheduler.

## 2015-05-11 NOTE — Addendum Note (Signed)
Addended by: Dorrene German on: 05/11/2015 11:28 AM   Modules accepted: Orders

## 2015-05-23 ENCOUNTER — Ambulatory Visit: Payer: BLUE CROSS/BLUE SHIELD | Admitting: Neurology

## 2015-05-23 ENCOUNTER — Telehealth: Payer: Self-pay | Admitting: Neurology

## 2015-05-23 NOTE — Telephone Encounter (Signed)
Christian Roberts [id number]  SELF [id number] * [phone]  NEED TO KNOW NEXT APPT-DATE /TIME   Operator called back, got VM, left msg of time and date of appt

## 2015-05-24 ENCOUNTER — Telehealth: Payer: Self-pay | Admitting: Internal Medicine

## 2015-05-24 ENCOUNTER — Encounter: Payer: Self-pay | Admitting: Internal Medicine

## 2015-05-24 ENCOUNTER — Ambulatory Visit (INDEPENDENT_AMBULATORY_CARE_PROVIDER_SITE_OTHER): Payer: BLUE CROSS/BLUE SHIELD | Admitting: Internal Medicine

## 2015-05-24 VITALS — BP 118/62 | HR 85 | Temp 97.7°F | Ht 64.0 in | Wt 128.2 lb

## 2015-05-24 DIAGNOSIS — M159 Polyosteoarthritis, unspecified: Secondary | ICD-10-CM

## 2015-05-24 DIAGNOSIS — M150 Primary generalized (osteo)arthritis: Secondary | ICD-10-CM

## 2015-05-24 DIAGNOSIS — E119 Type 2 diabetes mellitus without complications: Secondary | ICD-10-CM | POA: Diagnosis not present

## 2015-05-24 DIAGNOSIS — Z09 Encounter for follow-up examination after completed treatment for conditions other than malignant neoplasm: Secondary | ICD-10-CM

## 2015-05-24 DIAGNOSIS — M8949 Other hypertrophic osteoarthropathy, multiple sites: Secondary | ICD-10-CM

## 2015-05-24 LAB — HEMOGLOBIN A1C: HEMOGLOBIN A1C: 6.8 % — AB (ref 4.6–6.5)

## 2015-05-24 MED ORDER — LIDOCAINE 5 % EX PTCH: 2.0000 | MEDICATED_PATCH | CUTANEOUS | Status: AC

## 2015-05-24 NOTE — Progress Notes (Signed)
Pre visit review using our clinic review tool, if applicable. No additional management support is needed unless otherwise documented below in the visit note. 

## 2015-05-24 NOTE — Assessment & Plan Note (Signed)
DM: Continue metformin, check A1c DJD, RA:  Pain management from was the main focus of the visit. Chart is reviewed: reports a severe allergic reaction to  NSAIDs-codeine-MSO4 ; can tolerate oxycodone but the pain relief is just mild so he is not taking it. Lidocaine patches are helpful to some extent, refills provided. We agreed to increase gabapentin to 3 times a day noting that with high doses he gets somewhat sleepy. He will call me if interested in a pain management referral.

## 2015-05-24 NOTE — Patient Instructions (Signed)
GO TO THE LAB : Get the blood work     GO TO THE FRONT DESK Schedule your next appointment for a  Check up in 6 months , fasting

## 2015-05-24 NOTE — Telephone Encounter (Signed)
Relation to [id number] Call back number:[phone]   Reason for call:  Patient states he consulted with spouse and he would like to proceed with pain manament referral. Please advise

## 2015-05-24 NOTE — Telephone Encounter (Signed)
Referral placed.

## 2015-05-24 NOTE — Progress Notes (Signed)
Subjective:    Patient ID: Christian Roberts, male    DOB: 1945/06/30, 70 y.o.   MRN: [id number]  DOS:  05/24/2015 Type of visit - description :  Interval history:  His main concern is pain,  right shoulder pain came back, continue with upper and lower back pain. he is not suicidal but pain certainly bring his mood down. He also has issues with his son, under a lot of stress .   Review of Systems   Past Medical History  Diagnosis Date  . Allergic rhinitis   . GERD (gastroesophageal reflux disease)   . Hyperlipidemia   . Hernia   . Diabetes mellitus     2012  . Headache(784.0)     headaches-related to sinuses & uses Tylenol   . Skin cancer, basal cell     sees derm  . Osteoarthritis     ankle- left, hands & neck & hip  . Complication of anesthesia   . PONV (postoperative nausea and vomiting)     WITH ETHER  . Asthma     "I outgrew it"  . Numbness of fingers   . Constipation   . Nocturia   . Chronic rheumatic arthritis ([place]) 01/20/2014    Per Dr. Amil Amen   . Complete tear of rotator cuff 2016    Right, MRI in 2016, complete rupture of long head of the biceps as well as a tear of the subscapularis and supraspinatus tendons, Dr. Mardelle Matte  . Carpal tunnel syndrome   . Hemorrhoids   . Headache disorder 11/14/2014    Past Surgical History  Procedure Laterality Date  . Ankle surgery Left   . Esophagogastroduodenoscopy    . Tonsillectomy    . Varicocelectomy    . Left ankle mass removed  1972    3 surgeries on L ankle- 73, 2008, 2009  . Neck surgery  03/11,     C3-C7  . Back surgery      cerv. fusion C1- T1, 2 separate surgeries , T1-T3 2014  . Appendectomy      1964  . Cystoscopy      multiple times for infections  . Inguinal hernia repair  03/27/2011    Procedure: HERNIA REPAIR INGUINAL ADULT;  Surgeon: Harl Bowie, MD;  Location: [place];  Service: General;  Laterality: Left;  . Carpal tunnel release Right   . Rectal surgery  at birth    "[place]" SURGICAL CORRECTION  . Shoulder surgery  09/17/12  . Inguinal hernia repair Bilateral 12/09/2012    Procedure: LAPAROSCOPIC BILATERAL INGUINAL HERNIA REPAIR WITH MESH;  Surgeon: Harl Bowie, MD;  Location: [facility];  Service: General;  Laterality: Bilateral;  . Insertion of mesh Bilateral 12/09/2012    Procedure: INSERTION OF MESH;  Surgeon: Harl Bowie, MD;  Location: [facility];  Service: General;  Laterality: Bilateral;  . Ankle fusion Left 03-19-13    Dr Mardelle Matte  . Carpal tunnel release Left 07-14-2014    [place]  . Rotator cuff repair w/ distal clavicle excision Right 08/25/2014    And acromioplasty, Dr. Mardelle Matte    Social History   Social History  . Marital Status: Married    Spouse Name: Arbie Cookey  . Number of Children: 3  . Years of Education: BA+   Occupational History  . 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 Smoking status: Current Every Day Smoker -- 5.00 packs/day    Types: Cigarettes  Last Attempt to Quit: 04/10/2012  . Smokeless tobacco: Never Used     Comment: quit on-off since 2014   . Alcohol Use: No  . Drug Use: No  . Sexual Activity: Not on file   Other Topics Concern  . Not on file   Social History Narrative   Married , lives w/ wife           Medication List       This list is accurate as of: 05/24/15  2:33 PM.  Always use your most recent med list.               acetaminophen 500 MG tablet  Commonly known as:  TYLENOL  Take 500-1,000 mg by mouth every 4 (four) hours as needed. For pain     albuterol 108 (90 Base) MCG/ACT inhaler  Commonly known as:  VENTOLIN HFA  Inhale 2 puffs into the lungs every 6 (six) hours as needed for wheezing or shortness of breath.     BENADRYL 25 MG tablet  Generic drug:  diphenhydrAMINE  Take 25 mg by mouth every 6 (six) hours as needed.     DOCUSATE SODIUM PO  Take by mouth. Reported on 01/26/2015     ENBREL 50 MG/ML injection  Generic drug:  etanercept  Inject 50 mg into the  skin once a week.     esomeprazole 10 MG packet  Commonly known as:  NEXIUM  Take 10 mg by mouth daily before breakfast.     fish oil-omega-3 fatty acids 1000 MG capsule  Take 1 g by mouth 2 (two) times daily.     fluticasone 50 MCG/ACT nasal spray  Commonly known as:  FLONASE  Place 1 spray into both nostrils daily.     folic acid 1 MG tablet  Commonly known as:  FOLVITE  Take 1 mg by mouth daily.     freestyle lancets  Check blood sugars no more than twice daily.     gabapentin 300 MG capsule  Commonly known as:  NEURONTIN  Take 300 mg by mouth 3 (three) times daily.     GAS-X PO  Take by mouth. Reported on 01/26/2015     ACCU-CHEK SMARTVIEW test strip  Generic drug:  glucose blood  Reported on 05/24/2015     glucose blood test strip  Commonly known as:  FREESTYLE LITE  Check blood sugar no more than twice daily.     hydrocortisone 2.5 % cream  Apply 1 application topically 2 (two) times daily. Reported on 01/26/2015     lidocaine 5 %  Commonly known as:  LIDODERM  Place 2 patches onto the skin daily.     METAMUCIL PO  Take by mouth.     metFORMIN 1000 MG tablet  Commonly known as:  GLUCOPHAGE  Take 1 tablet (1,000 mg total) by mouth 2 (two) times daily with a meal.     Methotrexate (PF) 20 MG/0.4ML Soaj  Inject into the skin. Once a week     MYRBETRIQ 50 MG Tb24 tablet  Generic drug:  mirabegron ER     ONE TOUCH ULTRA SYSTEM KIT w/Device Kit  Use device to check blood sugar.     PROCTOSOL HC 2.5 % rectal cream  Generic drug:  hydrocortisone  Reported on 01/26/2015     Pseudoephedrine HCl (Deter) 30 MG Taba  Take 1 tablet by mouth as needed. Reported on 01/26/2015     simvastatin 20 MG tablet  Commonly known as:  ZOCOR  Take 1 tablet (20 mg total) by mouth at bedtime.     tamsulosin 0.4 MG Caps capsule  Commonly known as:  FLOMAX  Take 0.4 mg by mouth daily after supper.     topiramate 25 MG tablet  Commonly known as:  TOPAMAX  TAKE 1 TAB AT  NIGHT X1 WEEK 2 TABS AT NIGHT X1 WEEK THEN 3 TABS AT NIGHT     ZIMS MAX-FREEZE 3.7 % Gel  Generic drug:  Menthol (Topical Analgesic)  Apply topically.           Objective:   Physical Exam BP 118/62 mmHg  Pulse 85  Temp(Src) 97.7 F (36.5 C) (Oral)  Ht _0  (1.626 m)  Wt 128 lb 4 oz (58.174 kg)  BMI 22.00 kg/m2  SpO2 98% General:   Well developed, well nourished . NAD.  HEENT:  Normocephalic . Face symmetric, atraumatic  Skin: Not pale. Not jaundice Neurologic:  alert & oriented X3.  Speech normal, gait appropriate for age and unassisted Psych--  Cognition and judgment appear intact.  Cooperative with normal attention span and concentration.  Behavior appropriate. No anxious or depressed appearing.      Assessment & Plan:  Assessment > DM 2012 Hyperlipidemia zocor  GERD Abnormal gallbladder per CT-- Saw surgery 12-16: No surgery rx MSK: ---Pain mngmt:  reports a severe allergic reaction to  NSAIDs-codeine-MSO4 ; can tolerate oxycodone but the pain relief is just mild   ---Rheumatoid arthritis -- on enbrel - MTX (Dr Elpidio Galea) ---DJD multiple sites, cervical spondylosis -- gabapentin ---CTS Headaches --topamax GU: sees urology --Elevated PSA --BPH Skin cancer, BCC  Plan: DM: Continue metformin, check A1c DJD, RA:  Pain management from was the main focus of the visit. Chart is reviewed: reports a severe allergic reaction to  NSAIDs-codeine-MSO4 ; can tolerate oxycodone but the pain relief is just mild so he is not taking it. Lidocaine patches are helpful to some extent, refills provided. We agreed to increase gabapentin to 3 times a day noting that with high doses he gets somewhat sleepy. He will call me if interested in a pain management referral.  Today, I spent more than 20   min with the patient: >50% of the time counseling regards Pain management options and reviewing his chart regards the issue

## 2015-06-08 ENCOUNTER — Encounter: Payer: Self-pay | Admitting: Neurology

## 2015-06-08 ENCOUNTER — Ambulatory Visit (INDEPENDENT_AMBULATORY_CARE_PROVIDER_SITE_OTHER): Payer: BLUE CROSS/BLUE SHIELD | Admitting: Neurology

## 2015-06-08 VITALS — BP 120/66 | HR 88 | Ht 64.0 in | Wt 128.0 lb

## 2015-06-08 DIAGNOSIS — R519 Headache, unspecified: Secondary | ICD-10-CM

## 2015-06-08 DIAGNOSIS — R51 Headache: Secondary | ICD-10-CM

## 2015-06-08 MED ORDER — TOPIRAMATE 50 MG PO TABS: 150.0000 mg | ORAL_TABLET | Freq: Every day | ORAL | Status: DC

## 2015-06-08 MED ORDER — RIZATRIPTAN BENZOATE 10 MG PO TBDP: 10.0000 mg | ORAL_TABLET | Freq: Three times a day (TID) | ORAL | Status: DC | PRN

## 2015-06-08 NOTE — Progress Notes (Signed)
Reason for visit: Migraine headache  Christian Roberts is an 70 y.o. male  History of present illness:  Mr. Christian Roberts is a 70 year old right-handed white male with a history of left-sided headaches felt secondary to migraine. The patient had done quite well initially on Topamax, working up to 75 mg at night. The patient has had a significant change in frequency and severity of his headaches over the last 3 weeks. He has begun having virtually daily headaches, once again beginning around the left eye, spreading back to the parietal and occipital area, and even down into the left face. The patient may have photophobia, phonophobia, nausea with the headache. The patient takes Tylenol for the headache without much benefit, but he also takes pseudoephedrine and the headache is usually lasting no more than 20 minutes or an hour after he takes the medication. The patient has not had any focal symptoms of numbness or weakness with the headache events. He is tolerating the Topamax fairly well. He returns to this office for an evaluation. His diabetes has been under good control, his last hemoglobin A1c was 6.8.  Past Medical History  Diagnosis Date  . Allergic rhinitis   . GERD (gastroesophageal reflux disease)   . Hyperlipidemia   . Hernia   . Diabetes mellitus     2012  . Headache(784.0)     headaches-related to sinuses & uses Tylenol   . Skin cancer, basal cell     sees derm  . Osteoarthritis     ankle- left, hands & neck & hip  . Complication of anesthesia   . PONV (postoperative nausea and vomiting)     WITH ETHER  . Asthma     "I outgrew it"  . Numbness of fingers   . Constipation   . Nocturia   . Chronic rheumatic arthritis ([place]) 01/20/2014    Per Dr. Amil Amen   . Complete tear of rotator cuff 2016    Right, MRI in 2016, complete rupture of long head of the biceps as well as a tear of the subscapularis and supraspinatus tendons, Dr. Mardelle Matte  . Carpal tunnel syndrome   . Hemorrhoids     . Headache disorder 11/14/2014    Past Surgical History  Procedure Laterality Date  . Ankle surgery Left   . Esophagogastroduodenoscopy    . Tonsillectomy    . Varicocelectomy    . Left ankle mass removed  1972    3 surgeries on L ankle- 73, 2008, 2009  . Neck surgery  03/11,     C3-C7  . Back surgery      cerv. fusion C1- T1, 2 separate surgeries , T1-T3 2014  . Appendectomy      1964  . Cystoscopy      multiple times for infections  . Inguinal hernia repair  03/27/2011    Procedure: HERNIA REPAIR INGUINAL ADULT;  Surgeon: Harl Bowie, MD;  Location: [place];  Service: General;  Laterality: Left;  . Carpal tunnel release Right   . Rectal surgery  at birth    "[place]" SURGICAL CORRECTION  . Shoulder surgery  09/17/12  . Inguinal hernia repair Bilateral 12/09/2012    Procedure: LAPAROSCOPIC BILATERAL INGUINAL HERNIA REPAIR WITH MESH;  Surgeon: Harl Bowie, MD;  Location[facility];  Service: General;  Laterality: Bilateral;  . Insertion of mesh Bilateral 12/09/2012    Procedure: INSERTION OF MESH;  Surgeon: Harl Bowie, MD;  Location[facility];  Service:  General;  Laterality: Bilateral;  . Ankle fusion Left 03-19-13    Dr Mardelle Matte  . Carpal tunnel release Left 07-14-2014    [place]  . Rotator cuff repair w/ distal clavicle excision Right 08/25/2014    And acromioplasty, Dr. Mardelle Matte    Family History  Problem Relation Age of Onset  . Stroke Mother   . Anesthesia problems Mother   . Cerebral aneurysm Father   . Skin cancer Father     melanoma  . Stroke Father   . Diabetes Sister     hypoglycemia  . Breast cancer Sister     x 2  . Lung cancer Sister   . Atrial fibrillation Sister   . Colon cancer Neg Hx   . Prostate cancer Neg Hx   . Lung cancer Sister   . Emphysema Sister   . Atrial fibrillation Sister     Social history:  reports that he has been smoking Cigarettes.  He has been smoking about 5.00 packs per day. He has never used smokeless  tobacco. He reports that he does not drink alcohol or use illicit drugs.    Allergies  Allergen Reactions  . Aspirin Anaphylaxis  . Codeine Phosphate Anaphylaxis  . Ibuprofen Anaphylaxis  . Levofloxacin Itching and Anaphylaxis  . Menthol Swelling  . Monosodium Glutamate Diarrhea and Nausea And Vomiting    Other reaction(s): Cramps (ALLERGY/intolerance)  . Morphine Sulfate Anaphylaxis  . Naproxen Sodium Anaphylaxis  . Nsaids Anaphylaxis  . Other Itching    **BAND-AID**  . Sulfamethoxazole Anaphylaxis    Medications:  Prior to Admission medications   Medication Sig Start Date End Date Taking? Authorizing Provider  acetaminophen (TYLENOL) 500 MG tablet Take 500-1,000 mg by mouth every 4 (four) hours as needed. For pain   Yes Historical Provider, MD  albuterol (VENTOLIN HFA) 108 (90 BASE) MCG/ACT inhaler Inhale 2 puffs into the lungs every 6 (six) hours as needed for wheezing or shortness of breath. 03/29/14  Yes Colon Branch, MD  Blood Glucose Monitoring Suppl (ONE TOUCH ULTRA SYSTEM KIT) W/DEVICE KIT Use device to check blood sugar. 08/25/14  Yes Colon Branch, MD  diphenhydrAMINE (BENADRYL) 25 MG tablet Take 25 mg by mouth every 6 (six) hours as needed.   Yes Historical Provider, MD  DOCUSATE SODIUM PO Take by mouth. Reported on 01/26/2015   Yes Historical Provider, MD  esomeprazole (NEXIUM) 10 MG packet Take 10 mg by mouth daily before breakfast.   Yes Historical Provider, MD  etanercept (ENBREL) 50 MG/ML injection Inject 50 mg into the skin once a week.   Yes Historical Provider, MD  fish oil-omega-3 fatty acids 1000 MG capsule Take 1 g by mouth 2 (two) times daily.    Yes Historical Provider, MD  fluticasone (FLONASE) 50 MCG/ACT nasal spray Place 1 spray into both nostrils daily.   Yes Historical Provider, MD  folic acid (FOLVITE) 1 MG tablet Take 1 mg by mouth daily.   Yes Historical Provider, MD  gabapentin (NEURONTIN) 300 MG capsule Take 300 mg by mouth 3 (three) times daily.   Yes  Historical Provider, MD  glucose blood (ACCU-CHEK SMARTVIEW) test strip Reported on 05/24/2015 05/23/12  Yes Historical Provider, MD  glucose blood (FREESTYLE LITE) test strip Check blood sugar no more than twice daily. 08/09/14  Yes Colon Branch, MD  hydrocortisone 2.5 % cream Apply 1 application topically 2 (two) times daily. Reported on 01/26/2015   Yes Historical Provider, MD  Lancets (FREESTYLE) lancets Check blood sugars  no more than twice daily. 08/09/14  Yes Colon Branch, MD  lidocaine (LIDODERM) 5 % Place 2 patches onto the skin daily. 05/24/15  Yes Colon Branch, MD  Menthol, Topical Analgesic, (ZIMS MAX-FREEZE) 3.7 % GEL Apply topically.   Yes Historical Provider, MD  metFORMIN (GLUCOPHAGE) 1000 MG tablet Take 1 tablet (1,000 mg total) by mouth 2 (two) times daily with a meal. 05/11/15  Yes Colon Branch, MD  Methotrexate, PF, 20 MG/0.4ML SOAJ Inject into the skin. Once a week   Yes Historical Provider, MD  MYRBETRIQ 50 MG TB24 tablet  07/05/13  Yes Historical Provider, MD  PROCTOSOL HC 2.5 % rectal cream Reported on 01/26/2015 10/31/14  Yes Historical Provider, MD  Pseudoephedrine HCl, Deter, 30 MG TABA Take 1 tablet by mouth as needed. Reported on 01/26/2015   Yes Historical Provider, MD  Psyllium (METAMUCIL PO) Take by mouth.   Yes Historical Provider, MD  Simethicone (GAS-X PO) Take by mouth. Reported on 01/26/2015   Yes Historical Provider, MD  simvastatin (ZOCOR) 20 MG tablet Take 1 tablet (20 mg total) by mouth at bedtime. 04/24/15  Yes Colon Branch, MD  Tamsulosin HCl (FLOMAX) 0.4 MG CAPS Take 0.4 mg by mouth daily after supper.    Yes Historical Provider, MD  rizatriptan (MAXALT-MLT) 10 MG disintegrating tablet Take 1 tablet (10 mg total) by mouth 3 (three) times daily as needed for migraine. 06/08/15   Kathrynn Ducking, MD  topiramate (TOPAMAX) 50 MG tablet Take 3 tablets (150 mg total) by mouth at bedtime. 06/08/15   Kathrynn Ducking, MD    ROS:  Out of a complete 14 system review of symptoms, the  patient complains only of the following symptoms, and all other reviewed systems are negative.  Fatigue Ringing in the ears Eye discharge, light sensitivity, eye pain Frequency of urination, urinary urgency Joint pain, joint swelling, walking difficulty, neck pain, neck stiffness Bruising easily Memory loss, headache Agitation  Blood pressure 120/66, pulse 88, height 5' 4"  (1.626 m), weight 128 lb (58.06 kg).  Physical Exam  General: The patient is alert and cooperative at the time of the examination.  Skin: No significant peripheral edema is noted.   Neurologic Exam  Mental status: The patient is alert and oriented x 3 at the time of the examination. The patient has apparent normal recent and remote memory, with an apparently normal attention span and concentration ability.   Cranial nerves: Facial symmetry is present. Speech is normal, no aphasia or dysarthria is noted. Extraocular movements are full. Visual fields are full.  Motor: The patient has good strength in all 4 extremities.  Sensory examination: Soft touch sensation is symmetric on the face, arms, and legs.  Coordination: The patient has good finger-nose-finger and heel-to-shin bilaterally.  Gait and station: The patient has a normal gait. Tandem gait is normal. Romberg is negative. No drift is seen.  Reflexes: Deep tendon reflexes are symmetric.   Assessment/Plan:  1. Migraine headache, intractable   2. Bilateral carpal tunnel syndrome  The patient has had a significant worsening of his headache just in the last 3 weeks, he was doing quite well the headaches prior to this. The patient will go up on the Topamax taking 100 mg at night for a week, then convert to 150 mg at night. A prescription was called in for the 50 mg Topamax tablets taking 3 at night. He will also be given a prescription for Maxalt to take if needed for the  headache. He will follow-up in 3 months, sooner if needed. The patient has had recent  nerve conduction studies that documented ongoing bilateral carpal tunnel syndrome, he has had surgery for this previously.  Jill Alexanders MD 06/08/2015 6:24 PM  [facility] [address] [place], [place] [address]  Phone [phone] Fax [phone]

## 2015-06-09 ENCOUNTER — Telehealth: Payer: Self-pay | Admitting: Internal Medicine

## 2015-06-09 NOTE — Telephone Encounter (Signed)
Relation to [id number] Call back [phone] Pharmacy:  Reason for call:  Patient called checking on the status of PA for lidocaine (LIDODERM) 5 %. Patient spouse will drop off appeal form. Please advise

## 2015-06-09 NOTE — Telephone Encounter (Signed)
Pt's wife dropped off document to be filled out and Faxed. Production designer, theatre/television/film Form)

## 2015-06-09 NOTE — Telephone Encounter (Signed)
Appeals process started, forms faxed to [facility] appeals dept at [phone] successfully. Called and left detailed message for pt. Will update pt once a decision is made. JG//CMA

## 2015-06-09 NOTE — Telephone Encounter (Signed)
I have not received a PA request for this medication. Awaiting form below that pt's wife states she will drop off. JG//CMA

## 2015-06-10 ENCOUNTER — Other Ambulatory Visit: Payer: Self-pay | Admitting: Internal Medicine

## 2015-06-12 ENCOUNTER — Telehealth: Payer: Self-pay | Admitting: Neurology

## 2015-06-12 MED ORDER — PREDNISONE 5 MG PO TABS: ORAL_TABLET | ORAL | Status: DC

## 2015-06-12 MED ORDER — TOPIRAMATE 100 MG PO TABS: 100.0000 mg | ORAL_TABLET | Freq: Two times a day (BID) | ORAL | Status: DC

## 2015-06-12 NOTE — Telephone Encounter (Signed)
I called patient. The Topamax can be increased slightly, he go to 200 mg daily. If the Maxalt has not been extremely effective, we may switch to another type of medication to see if this helps him a bit more. I will try to call back later tonight.

## 2015-06-12 NOTE — Telephone Encounter (Signed)
Pt called in about rizatriptan (MAXALT-MLT) 10 MG disintegrating tablet. He has used 5 pills since Thursday June 1st. He is requesting another rx with more tablets. He states he is going out of town on Thursday. He wants to know abuot increasing topiramate (TOPAMAX) 50 MG tablet. Please call to advise

## 2015-06-12 NOTE — Telephone Encounter (Signed)
I called the patient, he is having virtually daily headaches. The Maxalt helps, but the headaches come back. I will call in for an early refill on the Maxalt, the patient can work up to a 200 mg daily dose of Topamax, and I will call in a prednisone Dosepak.

## 2015-06-12 NOTE — Telephone Encounter (Signed)
Spoke to pt. Says that he has been having daily HAs throughout the weekend that seem to "really hit around 4 in the afternoon." Feels that Maxalt has been very helpful in decreasing the length of HAs and he is afraid that he will not have enough to get him through his trip to [place]. He will be leaving on Thursday 06/15/15 and returning Monday 06/19/15 to visit his mother-in-law "who is in the last stages" and to go to his 65 yr high school reunion. Would like to consider increasing Topamax for prevention and possibility of early Maxalt refill.

## 2015-06-16 NOTE — Telephone Encounter (Signed)
Appeal approved effective 05/10/15 through 06/08/18. [facility] informed pt and pharmacy. Approval letter sent for scanning. JG//CMA  Case ID: [id number]

## 2015-07-18 ENCOUNTER — Telehealth: Payer: Self-pay | Admitting: Internal Medicine

## 2015-07-18 NOTE — Telephone Encounter (Signed)
FYI

## 2015-07-18 NOTE — Telephone Encounter (Signed)
[employer] Nurse & Case Manager  -  Linton Ham [phone]       Fax: [phone] (if needed)   She called in because she says that she would like to have a few things checked for pt.    Lipid panel, Micro-albumin level Microalbumin w/cr at. ratioMicroalbumin w/creat. ratio,

## 2015-07-19 NOTE — Telephone Encounter (Signed)
Please send a copy of most recent labs if patient agrees.

## 2015-07-19 NOTE — Telephone Encounter (Signed)
Last microalbumin checked 07/29/2014, and last lipid panel checked 01/26/2015, results printed and faxed to Linton Ham, case manager at [phone] as requested.

## 2015-08-24 ENCOUNTER — Encounter: Payer: Self-pay | Admitting: Physical Medicine & Rehabilitation

## 2015-08-24 ENCOUNTER — Encounter
Payer: BLUE CROSS/BLUE SHIELD | Attending: Physical Medicine & Rehabilitation | Admitting: Physical Medicine & Rehabilitation

## 2015-08-24 VITALS — BP 120/69 | HR 97 | Resp 17

## 2015-08-24 DIAGNOSIS — G8929 Other chronic pain: Secondary | ICD-10-CM | POA: Insufficient documentation

## 2015-08-24 DIAGNOSIS — K219 Gastro-esophageal reflux disease without esophagitis: Secondary | ICD-10-CM | POA: Diagnosis not present

## 2015-08-24 DIAGNOSIS — M542 Cervicalgia: Secondary | ICD-10-CM | POA: Diagnosis not present

## 2015-08-24 DIAGNOSIS — M545 Low back pain, unspecified: Secondary | ICD-10-CM

## 2015-08-24 DIAGNOSIS — G479 Sleep disorder, unspecified: Secondary | ICD-10-CM | POA: Diagnosis not present

## 2015-08-24 DIAGNOSIS — M159 Polyosteoarthritis, unspecified: Secondary | ICD-10-CM | POA: Insufficient documentation

## 2015-08-24 DIAGNOSIS — E785 Hyperlipidemia, unspecified: Secondary | ICD-10-CM | POA: Insufficient documentation

## 2015-08-24 DIAGNOSIS — G43009 Migraine without aura, not intractable, without status migrainosus: Secondary | ICD-10-CM

## 2015-08-24 DIAGNOSIS — M797 Fibromyalgia: Secondary | ICD-10-CM | POA: Diagnosis not present

## 2015-08-24 DIAGNOSIS — E119 Type 2 diabetes mellitus without complications: Secondary | ICD-10-CM | POA: Diagnosis not present

## 2015-08-24 MED ORDER — DULOXETINE HCL 30 MG PO CPEP: 30.0000 mg | ORAL_CAPSULE | Freq: Every day | ORAL | 1 refills | Status: DC

## 2015-08-24 NOTE — Progress Notes (Signed)
Subjective:    Patient ID: Christian Roberts, male    DOB: Oct 26, 1945, 70 y.o.   MRN: [id number]  HPI  70 y/o male with pmh of HLD, GERD, chronic neck pain, DM type 2, parasthesias b/l hands, generalized OA, cervical spondylosis and disc disease, lb/l rotator cuff tear and biceps tendon tear headache and psh of ankle fusion, b/l CTS release, neck surgery, b/l shoulder repair presents with pain all over >> low back pain.  This started ~03/2015.  He denies inciting event.  Getting progressively worse.  It is bilateral.  Lidoderm patches help with the pain.  Excessive activity exacerbates the pain.  Describes the pain as constant throbbing.  It radiates to his hips.  Today, severity is 8/10.  He denies associated numbness, tingling weakness.  He has not tried anything else for his lower back.  He denies falls. Pain prevents from doing activities he enjoys.   Pain Inventory Average Pain 6 Pain Right Now 8 My pain is sharp, burning, dull, stabbing, tingling and aching  In the last 24 hours, has pain interfered with the following? General activity 6 Relation with others 0 Enjoyment of life 6 What TIME of day is your pain at its worst? NA Sleep (in general) NA  Pain is worse with: NA Pain improves with: injections Relief from Meds: 4  Mobility use a cane Do you have any goals in this area?  yes  Function retired  Neuro/Psych bladder control problems weakness numbness tingling trouble walking spasms  Prior Studies x-rays CT/MRI nerve study  Physicians involved in your care Primary care [facility] . Rheumatologist . Neurosurgeon . Orthopedist .   Family History  Problem Relation Age of Onset  . Stroke Mother   . Anesthesia problems Mother   . Cerebral aneurysm Father   . Skin cancer Father     melanoma  . Stroke Father   . Diabetes Sister     hypoglycemia  . Breast cancer Sister     x 2  . Lung cancer Sister   . Atrial fibrillation Sister   . Lung  cancer Sister   . Emphysema Sister   . Atrial fibrillation Sister   . Colon cancer Neg Hx   . Prostate cancer Neg Hx    Social History   Social History  . Marital status: Married    Spouse name: Arbie Cookey  . Number of children: 3  . Years of education: BA+   Occupational History  . 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 Smoking status: Current Every Day Smoker    Packs/day: 5.00    Types: Cigarettes    Last attempt to quit: 04/10/2012  . Smokeless tobacco: Never Used     Comment: quit on-off since 2014   . Alcohol use No  . Drug use: No  . Sexual activity: Not Asked   Other Topics Concern  . None   Social History Narrative   Married , lives w/ wife   Right-handed   Rare caffeine intake      Past Surgical History:  Procedure Laterality Date  . ANKLE FUSION Left 03-19-13   Dr Mardelle Matte  . ANKLE SURGERY Left   . APPENDECTOMY     1964  . BACK SURGERY     cerv. fusion C1- T1, 2 separate surgeries , T1-T3 2014  . CARPAL TUNNEL RELEASE Right   . CARPAL TUNNEL RELEASE Left 07-14-2014   [place]  . CYSTOSCOPY  multiple times for infections  . INGUINAL HERNIA REPAIR  03/27/2011   Procedure: HERNIA REPAIR INGUINAL ADULT;  Surgeon: Harl Bowie, MD;  Location: [facility];  Service: General;  Laterality: Left;  . INGUINAL HERNIA REPAIR Bilateral 12/09/2012   Procedure: LAPAROSCOPIC BILATERAL INGUINAL HERNIA REPAIR WITH MESH;  Surgeon: Harl Bowie, MD;  Location[facility];  Service: General;  Laterality: Bilateral;  . INSERTION OF MESH Bilateral 12/09/2012   Procedure: INSERTION OF MESH;  Surgeon: Harl Bowie, MD;  [facility];  Service: General;  Laterality: Bilateral;  . left ankle mass removed  1972   3 surgeries on L ankle- 73, 2008, 2009  . NECK SURGERY  03/11,    C3-C7  . RECTAL SURGERY  at birth   "[place]" SURGICAL CORRECTION  . ROTATOR CUFF REPAIR W/ DISTAL CLAVICLE EXCISION Right 08/25/2014   And acromioplasty, Dr. Mardelle Matte    . SHOULDER SURGERY  09/17/12  . TONSILLECTOMY    . VARICOCELECTOMY     Past Medical History:  Diagnosis Date  . Allergic rhinitis   . Asthma    "I outgrew it"  . Carpal tunnel syndrome   . Chronic rheumatic arthritis ([place]) 01/20/2014   Per Dr. Amil Amen   . Complete tear of rotator cuff 2016   Right, MRI in 2016, complete rupture of long head of the biceps as well as a tear of the subscapularis and supraspinatus tendons, Dr. Mardelle Matte  . Complication of anesthesia   . Constipation   . Diabetes mellitus    2012  . Elevated PSA   . GERD (gastroesophageal reflux disease)   . Headache(784.0)    headaches-related to sinuses & uses Tylenol   . Hemorrhoids   . Hernia   . Hyperlipidemia   . Nocturia   . Numbness of fingers   . Osteoarthritis    ankle- left, hands & neck & hip  . PONV (postoperative nausea and vomiting)    WITH ETHER  . Skin cancer, basal cell    sees derm   BP 120/69   Pulse 97   Resp 17   SpO2 93%   Opioid Risk Score:   Fall Risk Score:  `1  Depression screen PHQ 2/9  Depression screen [facility] 2/9 05/24/2015 01/26/2015 11/15/2014 09/17/2013 07/28/2012  Decreased Interest 0 0 0 0 0  Down, Depressed, Hopeless 0 0 0 0 0  PHQ - 2 Score 0 0 0 0 0   Review of Systems  Constitutional:       Bladder control problems   Endocrine:       High blood sugar   Musculoskeletal: Positive for gait problem.  Neurological: Positive for weakness and numbness.       Tingling  Spasms   All other systems reviewed and are negative.     Objective:   Physical Exam Gen: NAD. Vital signs reviewed HENT: Normocephalic, Atraumatic Eyes: EOMI, Conj WNL Cardio: S1, S2 normal, RRR Pulm: B/l clear to auscultation.  Effort normal Abd: Soft, non-distended, non-tender, BS+ MSK:  Gait antalgic.   Mild TTP over left iliac crest.    No edema.   Neg FABERs.   ROM limitted due to pain Neuro:  CN II-XII grossly intact.    Sensation intact to light touch in all LE dermatomes  Reflexes 1+  throughout  Strength  5/5 in all LE myotomes  Neg SLR B/l Skin: Warm and Dry    Assessment & Plan:  70 y/o male with pmh of  HLD, GERD, chronic neck pain, DM type 2, parasthesias b/l hands, generalized OA, cervical spondylosis and disc disease, b/l rotator cuff tear and biceps tendon tear headache and psh of ankle fusion, b/l CTS release, neck surgery, b/l shoulder repair presents with pain all over >> low back pain.   1. Chronic low back pain  Pt states he has a severe allergy to NSAIDs   Pt has a listed allergy to codeine/morphine  Will order PT for stretching, core strengthening and TENs eval  Heat improves the pain, cont heat  Cont Lidoderm patches  Pt states he will speak with his wife about potential for pursuing pool therapy  Pt states he will speak with his wife about potential for psychology referral  Will order Cymbalta 30mg , will increase to 60 on next visit  Cont Tylenol   Will consider brace in future  Will consider [employer] in future  2. Migraines  Cont meds per Neurology  Pt to speak with Neurology regarding potential for Botox injections  3. Sleep disturbance  Pt currently taking 900-1200mg  Gabapentin  Secondary to pain, see #1  4. Generalized OA  See above  5. Fibromyalgia  Cymbalta 30, will consider increase to 60 on next visit

## 2015-08-29 ENCOUNTER — Ambulatory Visit (INDEPENDENT_AMBULATORY_CARE_PROVIDER_SITE_OTHER): Payer: BLUE CROSS/BLUE SHIELD | Admitting: Adult Health

## 2015-08-29 ENCOUNTER — Encounter: Payer: Self-pay | Admitting: Adult Health

## 2015-08-29 VITALS — BP 112/62 | HR 76 | Resp 20 | Ht 64.0 in | Wt 122.0 lb

## 2015-08-29 DIAGNOSIS — G43019 Migraine without aura, intractable, without status migrainosus: Secondary | ICD-10-CM | POA: Diagnosis not present

## 2015-08-29 MED ORDER — PREDNISONE 5 MG PO TABS: ORAL_TABLET | ORAL | 0 refills | Status: DC

## 2015-08-29 MED ORDER — RIZATRIPTAN BENZOATE 10 MG PO TBDP: ORAL_TABLET | ORAL | 3 refills | Status: DC

## 2015-08-29 NOTE — Progress Notes (Signed)
PATIENT: Christian Roberts DOB: Feb 08, 1945  REASON FOR VISIT: follow up- headache HISTORY FROM: patient  HISTORY OF PRESENT ILLNESS: Christian Roberts is a 70 year old male with a history of left-sided headaches. He returns today for follow-up. He reports that in June he was given a prednisone Dosepak. He states that his headaches resolved for almost one month. He states in the last 3 weeks they started to come back and now are essentially daily again. He states that his headaches usually always occur around 4 PM. It involves the entire left side of the head. Typically if he is able to take Maxalt it well resolve within 20 minutes. He also he uses an ice pack and Orajel on the gums. He does have photophobia and phonophobia. He does have nausea but no vomiting. Reports that he was started on Cymbalta this week for his back pain. He has decreased his caffeine intake. Continues to take Topamax 200 mg daily. The patient is not opposed to starting a new preventative medication however he is leaving for [place] on Thursday. He returns today for an evaluation.   HISTORY 06/08/15: Christian Roberts is a 70 year old right-handed white male with a history of left-sided headaches felt secondary to migraine. The patient had done quite well initially on Topamax, working up to 75 mg at night. The patient has had a significant change in frequency and severity of his headaches over the last 3 weeks. He has begun having virtually daily headaches, once again beginning around the left eye, spreading back to the parietal and occipital area, and even down into the left face. The patient may have photophobia, phonophobia, nausea with the headache. The patient takes Tylenol for the headache without much benefit, but he also takes pseudoephedrine and the headache is usually lasting no more than 20 minutes or an hour after he takes the medication. The patient has not had any focal symptoms of numbness or weakness with the headache events. He  is tolerating the Topamax fairly well. He returns to this office for an evaluation. His diabetes has been under good control, his last hemoglobin A1c was 6.8.                                                                                                                      REVIEW OF SYSTEMS: Out of a complete 14 system review of symptoms, the patient complains only of the following symptoms, and all other reviewed systems are negative.  See history of present illness  ALLERGIES: Allergies  Allergen Reactions  . Aspirin Anaphylaxis  . Codeine Phosphate Anaphylaxis  . Ibuprofen Anaphylaxis  . Levofloxacin Itching and Anaphylaxis  . Menthol Swelling  . Monosodium Glutamate Diarrhea and Nausea And Vomiting    Other reaction(s): Cramps (ALLERGY/intolerance)  . Morphine Sulfate Anaphylaxis  . Naproxen Sodium Anaphylaxis  . Nsaids Anaphylaxis  . Other Itching    **BAND-AID**  . Sulfamethoxazole Anaphylaxis    HOME MEDICATIONS: Outpatient Medications Prior to Visit  Medication Sig Dispense Refill  .  acetaminophen (TYLENOL) 500 MG tablet Take 500-1,000 mg by mouth every 4 (four) hours as needed. For pain    . albuterol (VENTOLIN HFA) 108 (90 BASE) MCG/ACT inhaler Inhale 2 puffs into the lungs every 6 (six) hours as needed for wheezing or shortness of breath. 1 Inhaler 1  . Blood Glucose Monitoring Suppl (ONE TOUCH ULTRA SYSTEM KIT) W/DEVICE KIT Use device to check blood sugar. 1 each 0  . diphenhydrAMINE (BENADRYL) 25 MG tablet Take 25 mg by mouth every 6 (six) hours as needed.    Marland Kitchen DOCUSATE SODIUM PO Take by mouth. Reported on 01/26/2015    . DULoxetine (CYMBALTA) 30 MG capsule Take 1 capsule (30 mg total) by mouth daily. 30 capsule 1  . esomeprazole (NEXIUM) 10 MG packet Take 10 mg by mouth daily before breakfast.    . etanercept (ENBREL) 50 MG/ML injection Inject 50 mg into the skin once a week.    . fish oil-omega-3 fatty acids 1000 MG capsule Take 1 g by mouth 2 (two) times daily.      . fluticasone (FLONASE) 50 MCG/ACT nasal spray Place 1 spray into both nostrils daily.    . folic acid (FOLVITE) 1 MG tablet Take 1 mg by mouth daily.    Marland Kitchen gabapentin (NEURONTIN) 300 MG capsule Take 1 capsule (300 mg total) by mouth 4 (four) times daily. 450 capsule 1  . glucose blood (ACCU-CHEK SMARTVIEW) test strip Reported on 05/24/2015    . glucose blood (FREESTYLE LITE) test strip Check blood sugar no more than twice daily. 100 each 12  . hydrocortisone 2.5 % cream Apply 1 application topically 2 (two) times daily. Reported on 01/26/2015    . Lancets (FREESTYLE) lancets Check blood sugars no more than twice daily. 100 each 12  . lidocaine (LIDODERM) 5 % Place 2 patches onto the skin daily. 90 patch 3  . Lifitegrast (XIIDRA) 5 % SOLN Apply to eye.    . Menthol, Topical Analgesic, (BIOFREEZE ROLL-ON EX) Apply topically.    . metFORMIN (GLUCOPHAGE) 1000 MG tablet Take 1 tablet (1,000 mg total) by mouth 2 (two) times daily with a meal. 180 tablet 1  . Methotrexate, PF, 20 MG/0.4ML SOAJ Inject into the skin. Once a week    . MYRBETRIQ 50 MG TB24 tablet     . PROCTOSOL HC 2.5 % rectal cream Reported on 01/26/2015    . Pseudoephedrine HCl, Deter, 30 MG TABA Take 1 tablet by mouth as needed. Reported on 01/26/2015    . Psyllium (METAMUCIL PO) Take by mouth.    . rizatriptan (MAXALT-MLT) 10 MG disintegrating tablet Take 1 tablet (10 mg total) by mouth 3 (three) times daily as needed for migraine. 9 tablet 3  . Simethicone (GAS-X PO) Take by mouth. Reported on 01/26/2015    . simvastatin (ZOCOR) 20 MG tablet Take 1 tablet (20 mg total) by mouth at bedtime. 90 tablet 1  . Tamsulosin HCl (FLOMAX) 0.4 MG CAPS Take 0.4 mg by mouth daily after supper.     . topiramate (TOPAMAX) 100 MG tablet Take 1 tablet (100 mg total) by mouth 2 (two) times daily. 60 tablet 3  . predniSONE (DELTASONE) 5 MG tablet Begin taking 6 tablets daily, taper by one tablet daily until off the medication. 21 tablet 0   No  facility-administered medications prior to visit.     PAST MEDICAL HISTORY: Past Medical History:  Diagnosis Date  . Allergic rhinitis   . Asthma    "I outgrew it"  .  Carpal tunnel syndrome   . Chronic rheumatic arthritis ([place]) 01/20/2014   Per Dr. Amil Amen   . Complete tear of rotator cuff 2016   Right, MRI in 2016, complete rupture of long head of the biceps as well as a tear of the subscapularis and supraspinatus tendons, Dr. Mardelle Matte  . Complication of anesthesia   . Constipation   . Diabetes mellitus    2012  . Elevated PSA   . GERD (gastroesophageal reflux disease)   . Headache(784.0)    headaches-related to sinuses & uses Tylenol   . Hemorrhoids   . Hernia   . Hyperlipidemia   . Nocturia   . Numbness of fingers   . Osteoarthritis    ankle- left, hands & neck & hip  . PONV (postoperative nausea and vomiting)    WITH ETHER  . Skin cancer, basal cell    sees derm    PAST SURGICAL HISTORY: Past Surgical History:  Procedure Laterality Date  . ANKLE FUSION Left 03-19-13   Dr Mardelle Matte  . ANKLE SURGERY Left   . APPENDECTOMY     1964  . BACK SURGERY     cerv. fusion C1- T1, 2 separate surgeries , T1-T3 2014  . CARPAL TUNNEL RELEASE Right   . CARPAL TUNNEL RELEASE Left 07-14-2014   [place]  . CYSTOSCOPY     multiple times for infections  . INGUINAL HERNIA REPAIR  03/27/2011   Procedure: HERNIA REPAIR INGUINAL ADULT;  Surgeon: Harl Bowie, MD;  Location: [facility];  Service: General;  Laterality: Left;  . INGUINAL HERNIA REPAIR Bilateral 12/09/2012   Procedure: LAPAROSCOPIC BILATERAL INGUINAL HERNIA REPAIR WITH MESH;  Surgeon: Harl Bowie, MD;  [facility];  Service: General;  Laterality: Bilateral;  . INSERTION OF MESH Bilateral 12/09/2012   Procedure: INSERTION OF MESH;  Surgeon: Harl Bowie, MD;  [facility];  Service: General;  Laterality: Bilateral;  . left ankle mass removed  1972   3 surgeries on L ankle- 73, 2008, 2009  . NECK SURGERY   03/11,    C3-C7  . RECTAL SURGERY  at birth   "[place]" SURGICAL CORRECTION  . ROTATOR CUFF REPAIR W/ DISTAL CLAVICLE EXCISION Right 08/25/2014   And acromioplasty, Dr. Mardelle Matte  . SHOULDER SURGERY  09/17/12  . TONSILLECTOMY    . VARICOCELECTOMY      FAMILY HISTORY: Family History  Problem Relation Age of Onset  . Stroke Mother   . Anesthesia problems Mother   . Cerebral aneurysm Father   . Skin cancer Father     melanoma  . Stroke Father   . Diabetes Sister     hypoglycemia  . Breast cancer Sister     x 2  . Lung cancer Sister   . Atrial fibrillation Sister   . Lung cancer Sister   . Emphysema Sister   . Atrial fibrillation Sister   . Colon cancer Neg Hx   . Prostate cancer Neg Hx     SOCIAL HISTORY: Social History   Social History  . Marital status: Married    Spouse name: Arbie Cookey  . Number of children: 3  . Years of education: BA+   Occupational History  . 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 Smoking status: Current Every Day Smoker    Packs/day: 5.00    Types: Cigarettes    Last attempt to quit: 04/10/2012  . Smokeless tobacco: Never Used     Comment:  quit on-off since 2014   . Alcohol use No  . Drug use: No  . Sexual activity: Not on file   Other Topics Concern  . Not on file   Social History Narrative   Married , lives w/ wife   Right-handed   Rare caffeine intake         PHYSICAL EXAM  Vitals:   08/29/15 0730  BP: 112/62  Pulse: 76  Resp: 20  Weight: 122 lb (55.3 kg)  Height: _0  (1.626 m)   Body mass index is 20.94 kg/m.  Generalized: Well developed, in no acute distress   Neurological examination  Mentation: Alert oriented to time, place, history taking. Follows all commands speech and language fluent Cranial nerve II-XII: Pupils were equal round reactive to light. Extraocular movements were full, visual field were full on confrontational test. Facial sensation and strength were normal. Uvula  tongue midline. Head turning and shoulder shrug  were normal and symmetric. Motor: The motor testing reveals 5 over 5 strength of all 4 extremities. Good symmetric motor tone is noted throughout.  Sensory: Sensory testing is intact to soft touch on all 4 extremities. No evidence of extinction is noted.  Coordination: Cerebellar testing reveals good finger-nose-finger and heel-to-shin bilaterally.  Gait and station: Gait is normal.  Reflexes: Deep tendon reflexes are symmetric and normal bilaterally.   DIAGNOSTIC DATA (LABS, IMAGING, TESTING) - I reviewed patient records, labs, notes, testing and imaging myself where available.  Lab Results  Component Value Date   WBC 7.2 01/26/2015   HGB 12.4 (L) 01/26/2015   HCT 38.3 (L) 01/26/2015   MCV 88.3 01/26/2015   PLT 347.0 01/26/2015      Component Value Date/Time   NA 142 01/26/2015 0841   NA 146 10/20/2014   K 4.2 01/26/2015 0841   CL 107 01/26/2015 0841   CO2 27 01/26/2015 0841   GLUCOSE 133 (H) 01/26/2015 0841   BUN 19 01/26/2015 0841   BUN 13 10/20/2014   CREATININE 0.98 01/26/2015 0841   CALCIUM 9.7 01/26/2015 0841   PROT 6.9 03/08/2013 0842   ALBUMIN 3.9 03/08/2013 0842   AST 16 10/20/2014   ALT 16 10/20/2014   ALKPHOS 72 10/20/2014   BILITOT 0.7 03/08/2013 0842   GFRNONAA 68 (L) 01/06/2014 1045   GFRAA 79 (L) 01/06/2014 1045   Lab Results  Component Value Date   CHOL 155 01/26/2015   HDL 57.10 01/26/2015   LDLCALC 82 01/26/2015   LDLDIRECT 145.3 12/23/2011   TRIG 79.0 01/26/2015   CHOLHDL 3 01/26/2015   Lab Results  Component Value Date   HGBA1C 6.8 (H) 05/24/2015        ASSESSMENT AND PLAN 70 y.o. year old male  has a past medical history of Allergic rhinitis; Asthma; Carpal tunnel syndrome; Chronic rheumatic arthritis ([place]) (01/20/2014); Complete tear of rotator cuff (2016); Complication of anesthesia; Constipation; Diabetes mellitus; Elevated PSA; GERD (gastroesophageal reflux disease); Headache(784.0);  Hemorrhoids; Hernia; Hyperlipidemia; Nocturia; Numbness of fingers; Osteoarthritis; PONV (postoperative nausea and vomiting); and Skin cancer, basal cell. here with:  1. Migraine headache  The patient will try another prednisone Dosepak. He is diabetic therefore advised that prednisone can increase the blood sugar he should keep a close check on his blood sugar levels. He voiced understanding. Will continue topamax 200 mg at bedtime. If headaches return will need to consider another preventative such as nortriptyline.We'll continue on Maxalt. Patient verbalized understanding. He will return in 2-3 months or sooner if needed.  Ward Givens, MSN, NP-C 08/29/2015, 7:39 AM [facility] [address] [place], [place] [address] [phone]

## 2015-08-29 NOTE — Progress Notes (Signed)
I have read the note, and I agree with the clinical assessment and plan.  Shalev Helminiak KEITH   

## 2015-08-29 NOTE — Patient Instructions (Signed)
Prednisone dosepak  May need to consider another preventative medication if headaches return- such as nortriptyline.  Nortriptyline capsules What is this medicine? NORTRIPTYLINE (nor TRIP ti leen) is used to treat depression. This medicine may be used for other purposes; ask your health care provider or pharmacist if you have questions. What should I tell my health care provider before I take this medicine? They need to know if you have any of these conditions: -an alcohol problem -bipolar disorder or schizophrenia -difficulty passing urine, prostate trouble -glaucoma -heart disease or recent heart attack -liver disease -over active thyroid -seizures -thoughts or plans of suicide or a previous suicide attempt or family history of suicide attempt -an unusual or allergic reaction to nortriptyline, other medicines, foods, dyes, or preservatives -pregnant or trying to get pregnant -breast-feeding How should I use this medicine? Take this medicine by mouth with a glass of water. Follow the directions on the prescription label. Take your doses at regular intervals. Do not take it more often than directed. Do not stop taking this medicine suddenly except upon the advice of your doctor. Stopping this medicine too quickly may cause serious side effects or your condition may worsen. A special MedGuide will be given to you by the pharmacist with each prescription and refill. Be sure to read this information carefully each time. Talk to your pediatrician regarding the use of this medicine in children. Special care may be needed. Overdosage: If you think you have taken too much of this medicine contact a poison control center or emergency room at once. NOTE: This medicine is only for you. Do not share this medicine with others. What if I miss a dose? If you miss a dose, take it as soon as you can. If it is almost time for your next dose, take only that dose. Do not take double or extra doses. What  may interact with this medicine? Do not take this medicine with any of the following medications: -arsenic trioxide -certain medicines medicines for irregular heart beat -cisapride -halofantrine -linezolid -MAOIs like Carbex, Eldepryl, Marplan, Nardil, and Parnate -methylene blue (injected into a vein) -other medicines for mental depression -phenothiazines like perphenazine, thioridazine and chlorpromazine -pimozide -probucol -procarbazine -sparfloxacin -St. John's Wort -ziprasidone This medicine may also interact with any of the following medications: -atropine and related drugs like hyoscyamine, scopolamine, tolterodine and others -barbiturate medicines for inducing sleep or treating seizures, such as phenobarbital -cimetidine -medicines for diabetes -medicines for seizures like carbamazepine or phenytoin -reserpine -thyroid medicine This list may not describe all possible interactions. Give your health care provider a list of all the medicines, herbs, non-prescription drugs, or dietary supplements you use. Also tell them if you smoke, drink alcohol, or use illegal drugs. Some items may interact with your medicine. What should I watch for while using this medicine? Tell your doctor if your symptoms do not get better or if they get worse. Visit your doctor or health care professional for regular checks on your progress. Because it may take several weeks to see the full effects of this medicine, it is important to continue your treatment as prescribed by your doctor. Patients and their families should watch out for new or worsening thoughts of suicide or depression. Also watch out for sudden changes in feelings such as feeling anxious, agitated, panicky, irritable, hostile, aggressive, impulsive, severely restless, overly excited and hyperactive, or not being able to sleep. If this happens, especially at the beginning of treatment or after a change in dose, call your  health care  professional. Dennis Bast may get drowsy or dizzy. Do not drive, use machinery, or do anything that needs mental alertness until you know how this medicine affects you. Do not stand or sit up quickly, especially if you are an older patient. This reduces the risk of dizzy or fainting spells. Alcohol may interfere with the effect of this medicine. Avoid alcoholic drinks. Do not treat yourself for coughs, colds, or allergies without asking your doctor or health care professional for advice. Some ingredients can increase possible side effects. Your mouth may get dry. Chewing sugarless gum or sucking hard candy, and drinking plenty of water may help. Contact your doctor if the problem does not go away or is severe. This medicine may cause dry eyes and blurred vision. If you wear contact lenses you may feel some discomfort. Lubricating drops may help. See your eye doctor if the problem does not go away or is severe. This medicine can cause constipation. Try to have a bowel movement at least every 2 to 3 days. If you do not have a bowel movement for 3 days, call your doctor or health care professional. This medicine can make you more sensitive to the sun. Keep out of the sun. If you cannot avoid being in the sun, wear protective clothing and use sunscreen. Do not use sun lamps or tanning beds/booths. What side effects may I notice from receiving this medicine? Side effects that you should report to your doctor or health care professional as soon as possible: -allergic reactions like skin rash, itching or hives, swelling of the face, lips, or tongue -abnormal production of milk in females -breast enlargement in both males and females -breathing problems -confusion, hallucinations -fever with increased sweating -irregular or fast, pounding heartbeat -muscle stiffness, or spasms -pain or difficulty passing urine, loss of bladder control -seizures -suicidal thoughts or other mood changes -swelling of the  testicles -tingling, pain, or numbness in the feet or hands -yellowing of the eyes or skin Side effects that usually do not require medical attention (report to your doctor or health care professional if they continue or are bothersome): -change in sex drive or performance -diarrhea -nausea, vomiting -weight gain or loss This list may not describe all possible side effects. Call your doctor for medical advice about side effects. You may report side effects to FDA at 1-800-FDA-1088. Where should I keep my medicine? Keep out of the reach of children. Store at room temperature between 15 and 30 degrees C (59 and 86 degrees F). Keep container tightly closed. Throw away any unused medicine after the expiration date. NOTE: This sheet is a summary. It may not cover all possible information. If you have questions about this medicine, talk to your doctor, pharmacist, or health care provider.    2016, Elsevier/Gold Standard. (2011-05-13 13:57:12)

## 2015-09-07 ENCOUNTER — Ambulatory Visit: Payer: BLUE CROSS/BLUE SHIELD | Admitting: Adult Health

## 2015-09-12 ENCOUNTER — Ambulatory Visit: Payer: BLUE CROSS/BLUE SHIELD | Attending: Physical Medicine & Rehabilitation | Admitting: Physical Therapy

## 2015-09-12 ENCOUNTER — Encounter: Payer: Self-pay | Admitting: Physical Therapy

## 2015-09-12 DIAGNOSIS — M545 Low back pain, unspecified: Secondary | ICD-10-CM

## 2015-09-12 DIAGNOSIS — M62830 Muscle spasm of back: Secondary | ICD-10-CM | POA: Diagnosis present

## 2015-09-12 NOTE — Therapy (Signed)
[place], [place], [address] Phone: [phone]   Fax:  [phone]  Physical Therapy Evaluation  Patient Details  Name: Christian Roberts MRN: [id number] Date of Birth: Feb 15, 1945 Referring Provider: Posey Pronto  Encounter Date: 09/12/2015      PT End of Session - 09/12/15 1507    Visit Number 1   Date for PT Re-Evaluation 11/12/15   PT Start Time 1442   PT Stop Time 1530   PT Time Calculation (min) 48 min   Activity Tolerance Patient tolerated treatment well   Behavior During Therapy [facility] for tasks assessed/performed      Past Medical History:  Diagnosis Date  . Allergic rhinitis   . Asthma    "I outgrew it"  . Carpal tunnel syndrome   . Chronic rheumatic arthritis ([place]) 01/20/2014   Per Dr. Amil Amen   . Complete tear of rotator cuff 2016   Right, MRI in 2016, complete rupture of long head of the biceps as well as a tear of the subscapularis and supraspinatus tendons, Dr. Mardelle Matte  . Complication of anesthesia   . Constipation   . Diabetes mellitus    2012  . Elevated PSA   . GERD (gastroesophageal reflux disease)   . Headache(784.0)    headaches-related to sinuses & uses Tylenol   . Hemorrhoids   . Hernia   . Hyperlipidemia   . Nocturia   . Numbness of fingers   . Osteoarthritis    ankle- left, hands & neck & hip  . PONV (postoperative nausea and vomiting)    WITH ETHER  . Skin cancer, basal cell    sees derm    Past Surgical History:  Procedure Laterality Date  . ANKLE FUSION Left 03-19-13   Dr Mardelle Matte  . ANKLE SURGERY Left   . APPENDECTOMY     1964  . BACK SURGERY     cerv. fusion C1- T1, 2 separate surgeries , T1-T3 2014  . CARPAL TUNNEL RELEASE Right   . CARPAL TUNNEL RELEASE Left 07-14-2014   [place]  . CYSTOSCOPY     multiple times for infections  . INGUINAL HERNIA REPAIR  03/27/2011   Procedure: HERNIA REPAIR INGUINAL ADULT;  Surgeon: Harl Bowie, MD;  Location:  [facility];  Service: General;  Laterality: Left;  . INGUINAL HERNIA REPAIR Bilateral 12/09/2012   Procedure: LAPAROSCOPIC BILATERAL INGUINAL HERNIA REPAIR WITH MESH;  Surgeon: Harl Bowie, MD;  Location: [facility];  Service: General;  Laterality: Bilateral;  . INSERTION OF MESH Bilateral 12/09/2012   Procedure: INSERTION OF MESH;  Surgeon: Harl Bowie, MD;  Location: [facility];  Service: General;  Laterality: Bilateral;  . left ankle mass removed  1972   3 surgeries on L ankle- 73, 2008, 2009  . NECK SURGERY  03/11,    C3-C7  . RECTAL SURGERY  at birth   "[place]" SURGICAL CORRECTION  . ROTATOR CUFF REPAIR W/ DISTAL CLAVICLE EXCISION Right 08/25/2014   And acromioplasty, Dr. Mardelle Matte  . SHOULDER SURGERY  09/17/12  . TONSILLECTOMY    . VARICOCELECTOMY      There were no vitals filed for this visit.       Subjective Assessment - 09/12/15 1443    Subjective Patient reports he has pain in the ankle since 1967, neck pain for 10 years, he reports that he has had low back pain for 6 months, reports 4 surgeries on the ankles, 3  surgeries on the neck.  He reports pain all over and for it changes daily.   Limitations Lifting;Sitting;Standing;Walking;House hold activities   How long can you sit comfortably? 5 minutes   How long can you stand comfortably? 5 minutes   How long can you walk comfortably? 5 minutes   Patient Stated Goals have less pain   Currently in Pain? Yes   Pain Score 7    Pain Location Back   Pain Orientation Lower   Pain Descriptors / Indicators Aching;Spasm   Pain Type Chronic pain   Pain Onset More than a month ago   Pain Frequency Constant   Aggravating Factors  sitting, bending, standing will increase pain to 10/10   Pain Relieving Factors nothing really helps, reports he cannot take any NSAID's   Effect of Pain on Daily Activities limits everything            [facility] PT Assessment - 09/12/15 0001      Assessment   Medical Diagnosis low back  pain   Referring Provider Patel   Onset Date/Surgical Date 08/12/15   Prior Therapy years ago     Precautions   Precautions None     Balance Screen   Has the patient fallen in the past 6 months No   Has the patient had a decrease in activity level because of a fear of falling?  No   Is the patient reluctant to leave their home because of a fear of falling?  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 posture, fwd head, decreased lordosis     ROM / Strength   AROM / PROM / Strength AROM;Strength     AROM   Overall AROM Comments Cervcial ROM was decreaesd 75%, lumbar ROM decreased 75% with pain in the neck and back with motions     Strength   Overall Strength Comments shoulders 3/5, LE's 3+/5     Flexibility   Soft Tissue Assessment /Muscle Length --  very tight in the HS, calves and piriformis     Palpation   Palpation comment he is very tight and tender in the lumbar area     Special Tests    Special Tests --  very sore with any motions                   [facility] Adult PT Treatment/Exercise - 09/12/15 0001      Modalities   Modalities Moist Heat;Electrical Stimulation     Moist Heat Therapy   Number Minutes Moist Heat 15 Minutes   Moist Heat Location Lumbar Spine     Electrical Stimulation   Electrical Stimulation Location lumbar area   Electrical Stimulation Action IFC   Electrical Stimulation Parameters supine   Electrical Stimulation Goals Pain                PT Education - 09/12/15 1506    Education provided Yes   Education Details wms flexion exercises   Person(s) Educated Patient   Methods Explanation;Demonstration;Handout   Comprehension Verbalized understanding          PT Short Term Goals - 09/12/15 1510      PT SHORT TERM GOAL #1   Title independent  with initial HEP   Time 2  Period Weeks   Status New           PT Long Term Goals - 09/12/15 1510      PT LONG TERM GOAL #1   Title understand proper posture and body mechanics   Time 8   Period Weeks   Status New     PT LONG TERM GOAL #2   Title decrease pain 25%   Time 8   Period Weeks   Status New     PT LONG TERM GOAL #3   Title increase lumbar ROM 25%   Time 8   Period Weeks   Status New     PT LONG TERM GOAL #4   Title report being able to stand to cook a meal x 10 minutes without pain >6/10   Time 8   Period Weeks   Status New               Plan - 09/12/15 1507    Clinical Impression Statement Pateint with numerous c/o pain in various sites, the newest pain is in the low back about 6 months ago.  He rates pain a 6-10 at most times, reports he cannot sit, stand or walk long due to pain.  He is very limited in ROM.   Rehab Potential Fair   PT Frequency 2x / week   PT Duration 8 weeks   PT Treatment/Interventions ADLs/Self Care Home Management;Moist Heat;Traction;Ultrasound;Electrical Stimulation;Cryotherapy;Patient/family education;Therapeutic exercise;Therapeutic activities;Manual techniques   PT Next Visit Plan try exercise, flexibility and stability   Consulted and Agree with Plan of Care Patient      Patient will benefit from skilled therapeutic intervention in order to improve the following deficits and impairments:  Abnormal gait, Decreased activity tolerance, Decreased mobility, Decreased range of motion, Decreased strength, Increased muscle spasms, Impaired flexibility, Postural dysfunction, Improper body mechanics, Pain  Visit Diagnosis: Midline low back pain without sciatica - Plan: PT plan of care cert/re-cert  Muscle spasm of back - Plan: PT plan of care cert/re-cert     Problem List Patient Active Problem List   Diagnosis Date Noted  . Carpal tunnel syndrome 12/26/2014  . Acute bronchitis 12/08/2014  . Hypersomnolence  12/08/2014  . Headache disorder 11/14/2014  . Paresthesia of both hands 11/14/2014  . PCP NOTES >>>>>>>>>>>>>>>>>> 11/14/2014  . Rectal bleeding 11/08/2014  . Diarrhea 11/08/2014  . Renal cyst 11/08/2014  . Liver cyst 11/08/2014  . Abnormal CT of the abdomen 11/08/2014  . Annual physical exam 01/20/2014  . Chronic rheumatic arthritis ([place]) 01/20/2014  . Skin infection 12/29/2013  . Skin lesion 07/19/2013  . Recurrent left inguinal hernia 11/02/2012  . Hemorrhoids 05/29/2012  . Left inguinal hernia 03/12/2011  . Diabetes type 2, controlled ([place]) 08/20/2010  . Hyperlipidemia 09/20/2009  . Elevated PSA--BPH-- Dr Risa Grill 07/21/2009  . Neck pain, radiculopathy, s/p surgery, on gabapentin 12/12/2008  . OTHER SPECIFIED DISORDER OF THE ESOPHAGUS 08/24/2008  . GERD 03/30/2007  . DJD (degenerative joint disease) 07/31/2006    Sumner Boast., PT 09/12/2015, 3:14 PM  [address] [place], [place], [address] Phone: [phone]   Fax:  [phone]  Name: NTHONY TRIPLETT MRN: [id number] Date of Birth: Jul 18, 1945

## 2015-09-18 ENCOUNTER — Ambulatory Visit: Payer: BLUE CROSS/BLUE SHIELD | Admitting: Physical Therapy

## 2015-09-18 ENCOUNTER — Encounter: Payer: Self-pay | Admitting: Physical Therapy

## 2015-09-18 DIAGNOSIS — M62830 Muscle spasm of back: Secondary | ICD-10-CM

## 2015-09-18 DIAGNOSIS — M545 Low back pain, unspecified: Secondary | ICD-10-CM

## 2015-09-18 NOTE — Therapy (Signed)
[place], [place], [address] Phone: [phone]   Fax:  [phone]  Physical Therapy Treatment  Patient Details  Name: Christian Roberts MRN: [id number] Date of Birth: [birth year] Referring Provider: Posey Pronto  Encounter Date: 09/18/2015      PT End of Session - 09/18/15 1525    Visit Number 2   Date for PT Re-Evaluation 11/12/15   PT Start Time 1430   PT Stop Time 1532   PT Time Calculation (min) 62 min   Activity Tolerance Patient tolerated treatment well   Behavior During Therapy [facility] for tasks assessed/performed      Past Medical History:  Diagnosis Date  . Allergic rhinitis   . Asthma    "I outgrew it"  . Carpal tunnel syndrome   . Chronic rheumatic arthritis ([place]) 01/20/2014   Per Dr. Amil Amen   . Complete tear of rotator cuff 2016   Right, MRI in 2016, complete rupture of long head of the biceps as well as a tear of the subscapularis and supraspinatus tendons, Dr. Mardelle Matte  . Complication of anesthesia   . Constipation   . Diabetes mellitus    2012  . Elevated PSA   . GERD (gastroesophageal reflux disease)   . Headache(784.0)    headaches-related to sinuses & uses Tylenol   . Hemorrhoids   . Hernia   . Hyperlipidemia   . Nocturia   . Numbness of fingers   . Osteoarthritis    ankle- left, hands & neck & hip  . PONV (postoperative nausea and vomiting)    WITH ETHER  . Skin cancer, basal cell    sees derm    Past Surgical History:  Procedure Laterality Date  . ANKLE FUSION Left 03-19-13   Dr Mardelle Matte  . ANKLE SURGERY Left   . APPENDECTOMY     1964  . BACK SURGERY     cerv. fusion C1- T1, 2 separate surgeries , T1-T3 2014  . CARPAL TUNNEL RELEASE Right   . CARPAL TUNNEL RELEASE Left 07-14-2014   [place]  . CYSTOSCOPY     multiple times for infections  . INGUINAL HERNIA REPAIR  03/27/2011   Procedure: HERNIA REPAIR INGUINAL ADULT;  Surgeon: Harl Bowie, MD;  Location:  [facility];  Service: General;  Laterality: Left;  . INGUINAL HERNIA REPAIR Bilateral 12/09/2012   Procedure: LAPAROSCOPIC BILATERAL INGUINAL HERNIA REPAIR WITH MESH;  Surgeon: Harl Bowie, MD;  [facility];  Service: General;  Laterality: Bilateral;  . INSERTION OF MESH Bilateral 12/09/2012   Procedure: INSERTION OF MESH;  Surgeon: Harl Bowie, MD;  [facility];  Service: General;  Laterality: Bilateral;  . left ankle mass removed  1972   3 surgeries on L ankle- 73, 2008, 2009  . NECK SURGERY  03/11,    C3-C7  . RECTAL SURGERY  at birth   "[place]" SURGICAL CORRECTION  . ROTATOR CUFF REPAIR W/ DISTAL CLAVICLE EXCISION Right 08/25/2014   And acromioplasty, Dr. Mardelle Matte  . SHOULDER SURGERY  09/17/12  . TONSILLECTOMY    . VARICOCELECTOMY      There were no vitals filed for this visit.      Subjective Assessment - 09/18/15 1432    Subjective "I haven't been able to do too much since last time"   Currently in Pain? Yes   Pain Score 6    Pain Location Hip   Pain Orientation Right;Left;Anterior  [facility] PT Assessment - 09/18/15 0001      AROM   Overall AROM Comments Cervical ROM was decreased 50%, lumbar ROM decreased 25% for extension                     [facility] Adult PT Treatment/Exercise - 09/18/15 0001      Exercises   Exercises Lumbar;Knee/Hip     Lumbar Exercises: Machines for Strengthening   Cybex Knee Extension 10lb 2x10   Cybex Knee Flexion 25lb 2x10     Lumbar Exercises: Seated   Sit to Stand 10 reps  x2 no UE support   Other Seated Lumbar Exercises Rows 25lb 2x10     Lumbar Exercises: Supine   Clam 20 reps;1 second   Bridge 15 reps;1 second   Other Supine Lumbar Exercises Ball squeezes x20     Knee/Hip Exercises: Aerobic   Nustep L5 x6 min      Knee/Hip Exercises: Machines for Strengthening   Cybex Leg Press 20lb 3x10     Modalities   Modalities Moist Heat;Electrical Stimulation     Moist Heat  Therapy   Number Minutes Moist Heat 15 Minutes   Moist Heat Location Lumbar Spine     Electrical Stimulation   Electrical Stimulation Location Lumbar area   Electrical Stimulation Action IFC   Electrical Stimulation Parameters supine   Electrical Stimulation Goals Pain                  PT Short Term Goals - 09/12/15 1510      PT SHORT TERM GOAL #1   Title independent with initial HEP   Time 2   Period Weeks   Status New           PT Long Term Goals - 09/18/15 1530      PT LONG TERM GOAL #1   Title understand proper posture and body mechanics   Status On-going     PT LONG TERM GOAL #2   Title decrease pain 25%   Status On-going     PT LONG TERM GOAL #3   Title increase lumbar ROM 25%   Status Partially Met     PT LONG TERM GOAL #4   Title report being able to stand to cook a meal x 10 minutes without pain >6/10   Status On-going               Plan - 09/18/15 1526    Clinical Impression Statement Pt witn an initial progression to machine level interventions. He completes all of today's exercises well and reports in increase in low back pain. Pt has also progressed increasing lumbar ROM.   Rehab Potential Fair   PT Frequency 2x / week   PT Duration 8 weeks   PT Treatment/Interventions ADLs/Self Care Home Management;Moist Heat;Traction;Ultrasound;Electrical Stimulation;Cryotherapy;Patient/family education;Therapeutic exercise;Therapeutic activities;Manual techniques   PT Next Visit Plan try exercise, flexibility and stability      Patient will benefit from skilled therapeutic intervention in order to improve the following deficits and impairments:  Abnormal gait, Decreased activity tolerance, Decreased mobility, Decreased range of motion, Decreased strength, Increased muscle spasms, Impaired flexibility, Postural dysfunction, Improper body mechanics, Pain  Visit Diagnosis: Midline low back pain without sciatica  Muscle spasm of  back     Problem List Patient Active Problem List   Diagnosis Date Noted  . Carpal tunnel syndrome 12/26/2014  . Acute bronchitis 12/08/2014  . Hypersomnolence 12/08/2014  . Headache disorder 11/14/2014  . Paresthesia  of both hands 11/14/2014  . PCP NOTES >>>>>>>>>>>>>>>>>> 11/14/2014  . Rectal bleeding 11/08/2014  . Diarrhea 11/08/2014  . Renal cyst 11/08/2014  . Liver cyst 11/08/2014  . Abnormal CT of the abdomen 11/08/2014  . Annual physical exam 01/20/2014  . Chronic rheumatic arthritis ([place]) 01/20/2014  . Skin infection 12/29/2013  . Skin lesion 07/19/2013  . Recurrent left inguinal hernia 11/02/2012  . Hemorrhoids 05/29/2012  . Left inguinal hernia 03/12/2011  . Diabetes type 2, controlled ([place]) 08/20/2010  . Hyperlipidemia 09/20/2009  . Elevated PSA--BPH-- Dr Risa Grill 07/21/2009  . Neck pain, radiculopathy, s/p surgery, on gabapentin 12/12/2008  . OTHER SPECIFIED DISORDER OF THE ESOPHAGUS 08/24/2008  . GERD 03/30/2007  . DJD (degenerative joint disease) 07/31/2006    Scot Jun, PTA  09/18/2015, 3:31 PM  [place], [place], [address] Phone: [phone]   Fax:  [phone]  Name: Christian Roberts MRN: [id number] Date of Birth: March 22, 1945

## 2015-09-20 ENCOUNTER — Other Ambulatory Visit: Payer: Self-pay | Admitting: Internal Medicine

## 2015-09-21 ENCOUNTER — Encounter: Payer: Self-pay | Admitting: Physical Therapy

## 2015-09-21 ENCOUNTER — Ambulatory Visit: Payer: BLUE CROSS/BLUE SHIELD | Admitting: Physical Therapy

## 2015-09-21 DIAGNOSIS — M545 Low back pain, unspecified: Secondary | ICD-10-CM

## 2015-09-21 DIAGNOSIS — M62830 Muscle spasm of back: Secondary | ICD-10-CM

## 2015-09-21 NOTE — Therapy (Signed)
[facility] [place] [facility] [place], [place], [address] Phone: [phone]   Fax:  [phone]  Physical Therapy Treatment  Patient Details  Name: Christian Roberts MRN: [id number] Date of Birth: Apr 19, 1945 Referring Provider: Posey Pronto  Encounter Date: 09/21/2015      PT End of Session - 09/21/15 1515    Visit Number 3   Date for PT Re-Evaluation 11/12/15   PT Start Time 1430   PT Stop Time 1529   PT Time Calculation (min) 59 min   Activity Tolerance Patient tolerated treatment well   Behavior During Therapy [facility] for tasks assessed/performed      Past Medical History:  Diagnosis Date  . Allergic rhinitis   . Asthma    "I outgrew it"  . Carpal tunnel syndrome   . Chronic rheumatic arthritis ([place]) 01/20/2014   Per Dr. Amil Amen   . Complete tear of rotator cuff 2016   Right, MRI in 2016, complete rupture of long head of the biceps as well as a tear of the subscapularis and supraspinatus tendons, Dr. Mardelle Matte  . Complication of anesthesia   . Constipation   . Diabetes mellitus    2012  . Elevated PSA   . GERD (gastroesophageal reflux disease)   . Headache(784.0)    headaches-related to sinuses & uses Tylenol   . Hemorrhoids   . Hernia   . Hyperlipidemia   . Nocturia   . Numbness of fingers   . Osteoarthritis    ankle- left, hands & neck & hip  . PONV (postoperative nausea and vomiting)    WITH ETHER  . Skin cancer, basal cell    sees derm    Past Surgical History:  Procedure Laterality Date  . ANKLE FUSION Left 03-19-13   Dr Mardelle Matte  . ANKLE SURGERY Left   . APPENDECTOMY     1964  . BACK SURGERY     cerv. fusion C1- T1, 2 separate surgeries , T1-T3 2014  . CARPAL TUNNEL RELEASE Right   . CARPAL TUNNEL RELEASE Left 07-14-2014   [place]  . CYSTOSCOPY     multiple times for infections  . INGUINAL HERNIA REPAIR  03/27/2011   Procedure: HERNIA REPAIR INGUINAL ADULT;  Surgeon: Harl Bowie, MD;  Location:  [facility];  Service: General;  Laterality: Left;  . INGUINAL HERNIA REPAIR Bilateral 12/09/2012   Procedure: LAPAROSCOPIC BILATERAL INGUINAL HERNIA REPAIR WITH MESH;  Surgeon: Harl Bowie, MD;  Location: [facility];  Service: General;  Laterality: Bilateral;  . INSERTION OF MESH Bilateral 12/09/2012   Procedure: INSERTION OF MESH;  Surgeon: Harl Bowie, MD;  Location: [facility];  Service: General;  Laterality: Bilateral;  . left ankle mass removed  1972   3 surgeries on L ankle- 72, 2008, 2009  . NECK SURGERY  03/11,    C3-C7  . RECTAL SURGERY  at birth   "[place]" SURGICAL CORRECTION  . ROTATOR CUFF REPAIR W/ DISTAL CLAVICLE EXCISION Right 08/25/2014   And acromioplasty, Dr. Mardelle Matte  . SHOULDER SURGERY  09/17/12  . TONSILLECTOMY    . VARICOCELECTOMY      There were no vitals filed for this visit.      Subjective Assessment - 09/21/15 1428    Subjective "Im a little sore at the neck "  Pt reports that he went to the gym the last two night and did something   Currently in Pain? Yes   Pain Score 7  Pain Location --  neck, L ankle                         [facility] Adult PT Treatment/Exercise - 09/21/15 0001      Lumbar Exercises: Machines for Strengthening   Cybex Knee Extension 10lb 2x10   Cybex Knee Flexion 25lb 2x10     Lumbar Exercises: Standing   Other Standing Lumbar Exercises Standing OHP yellow ball 2x10   Other Standing Lumbar Exercises Trunk rotations with yellow ball 2x10     Lumbar Exercises: Seated   Other Seated Lumbar Exercises Rows & Lats 25lb 2x10     Knee/Hip Exercises: Aerobic   Nustep L5 x 7 min      Knee/Hip Exercises: Machines for Strengthening   Cybex Leg Press 20lb 2x15     Modalities   Modalities Moist Heat;Electrical Stimulation     Moist Heat Therapy   Number Minutes Moist Heat 15 Minutes   Moist Heat Location Lumbar Spine     Electrical Stimulation   Electrical Stimulation Location Lumbar area   Electrical  Stimulation Action IFC   Electrical Stimulation Parameters supine   Electrical Stimulation Goals Pain                  PT Short Term Goals - 09/21/15 1516      PT SHORT TERM GOAL #1   Title independent with initial HEP   Status Partially Met           PT Long Term Goals - 09/21/15 1516      PT LONG TERM GOAL #1   Title understand proper posture and body mechanics   Status Partially Met     PT LONG TERM GOAL #2   Title decrease pain 25%   Status Partially Met     PT LONG TERM GOAL #3   Title increase lumbar ROM 25%   Status Partially Met     PT LONG TERM GOAL #4   Title report being able to stand to cook a meal x 10 minutes without pain >6/10   Status On-going               Plan - 09/21/15 1515    Clinical Impression Statement Pt tolerated today's interventions well evident by no subjective reports of increase pain. Pt reports that she has been going to the gym recently and performing a few exercises.    Rehab Potential Fair   PT Frequency 2x / week   PT Duration 8 weeks   PT Treatment/Interventions ADLs/Self Care Home Management;Moist Heat;Traction;Ultrasound;Electrical Stimulation;Cryotherapy;Patient/family education;Therapeutic exercise;Therapeutic activities;Manual techniques   PT Next Visit Plan try exercise, flexibility and stability      Patient will benefit from skilled therapeutic intervention in order to improve the following deficits and impairments:  Abnormal gait, Decreased activity tolerance, Decreased mobility, Decreased range of motion, Decreased strength, Increased muscle spasms, Impaired flexibility, Postural dysfunction, Improper body mechanics, Pain  Visit Diagnosis: Midline low back pain without sciatica  Muscle spasm of back     Problem List Patient Active Problem List   Diagnosis Date Noted  . Carpal tunnel syndrome 12/26/2014  . Acute bronchitis 12/08/2014  . Hypersomnolence 12/08/2014  . Headache disorder  11/14/2014  . Paresthesia of both hands 11/14/2014  . PCP NOTES >>>>>>>>>>>>>>>>>> 11/14/2014  . Rectal bleeding 11/08/2014  . Diarrhea 11/08/2014  . Renal cyst 11/08/2014  . Liver cyst 11/08/2014  . Abnormal CT of the abdomen 11/08/2014  .  Annual physical exam 01/20/2014  . Chronic rheumatic arthritis ([place]) 01/20/2014  . Skin infection 12/29/2013  . Skin lesion 07/19/2013  . Recurrent left inguinal hernia 11/02/2012  . Hemorrhoids 05/29/2012  . Left inguinal hernia 03/12/2011  . Diabetes type 2, controlled ([place]) 08/20/2010  . Hyperlipidemia 09/20/2009  . Elevated PSA--BPH-- Dr Risa Grill 07/21/2009  . Neck pain, radiculopathy, s/p surgery, on gabapentin 12/12/2008  . OTHER SPECIFIED DISORDER OF THE ESOPHAGUS 08/24/2008  . GERD 03/30/2007  . DJD (degenerative joint disease) 07/31/2006    Scot Jun 09/21/2015, 3:17 PM  [place] [address] [place], [place], [address] Phone: [phone]   Fax:  [phone]  Name: RISHARD DELANGE MRN: [id number] Date of Birth: [birth year]

## 2015-09-25 ENCOUNTER — Encounter: Payer: Self-pay | Admitting: Physical Therapy

## 2015-09-25 ENCOUNTER — Ambulatory Visit: Payer: BLUE CROSS/BLUE SHIELD | Admitting: Physical Therapy

## 2015-09-25 DIAGNOSIS — M545 Low back pain, unspecified: Secondary | ICD-10-CM

## 2015-09-25 DIAGNOSIS — M62830 Muscle spasm of back: Secondary | ICD-10-CM

## 2015-09-25 NOTE — Therapy (Signed)
[place], [place], [address] Phone: [phone]   Fax:  [phone]  Physical Therapy Treatment  Patient Details  Name: Christian Roberts MRN: [id number] Date of Birth: 1945-07-05 Referring Provider: Posey Pronto  Encounter Date: 09/25/2015      PT End of Session - 09/25/15 1432    Visit Number 4   Date for PT Re-Evaluation 11/12/15   PT Start Time 1345   PT Stop Time 1446   PT Time Calculation (min) 61 min   Activity Tolerance Patient tolerated treatment well   Behavior During Therapy [facility] for tasks assessed/performed      Past Medical History:  Diagnosis Date  . Allergic rhinitis   . Asthma    "I outgrew it"  . Carpal tunnel syndrome   . Chronic rheumatic arthritis ([place]) 01/20/2014   Per Dr. Amil Amen   . Complete tear of rotator cuff 2016   Right, MRI in 2016, complete rupture of long head of the biceps as well as a tear of the subscapularis and supraspinatus tendons, Dr. Mardelle Matte  . Complication of anesthesia   . Constipation   . Diabetes mellitus    2012  . Elevated PSA   . GERD (gastroesophageal reflux disease)   . Headache(784.0)    headaches-related to sinuses & uses Tylenol   . Hemorrhoids   . Hernia   . Hyperlipidemia   . Nocturia   . Numbness of fingers   . Osteoarthritis    ankle- left, Roberts & neck & hip  . PONV (postoperative nausea and vomiting)    WITH ETHER  . Skin cancer, basal cell    sees derm    Past Surgical History:  Procedure Laterality Date  . ANKLE FUSION Left 03-19-13   Dr Mardelle Matte  . ANKLE SURGERY Left   . APPENDECTOMY     1964  . BACK SURGERY     cerv. fusion C1- T1, 2 separate surgeries , T1-T3 2014  . CARPAL TUNNEL RELEASE Right   . CARPAL TUNNEL RELEASE Left 07-14-2014   [place]  . CYSTOSCOPY     multiple times for infections  . INGUINAL HERNIA REPAIR  03/27/2011   Procedure: HERNIA REPAIR INGUINAL ADULT;  Surgeon: Harl Bowie, MD;  Location:  [facility];  Service: General;  Laterality: Left;  . INGUINAL HERNIA REPAIR Bilateral 12/09/2012   Procedure: LAPAROSCOPIC BILATERAL INGUINAL HERNIA REPAIR WITH MESH;  Surgeon: Harl Bowie, MD;  [facility];  Service: General;  Laterality: Bilateral;  . INSERTION OF MESH Bilateral 12/09/2012   Procedure: INSERTION OF MESH;  Surgeon: Harl Bowie, MD;  [facility];  Service: General;  Laterality: Bilateral;  . left ankle mass removed  1972   3 surgeries on L ankle- 73, 2008, 2009  . NECK SURGERY  03/11,    C3-C7  . RECTAL SURGERY  at birth   "[place]" SURGICAL CORRECTION  . ROTATOR CUFF REPAIR W/ DISTAL CLAVICLE EXCISION Right 08/25/2014   And acromioplasty, Dr. Mardelle Matte  . SHOULDER SURGERY  09/17/12  . TONSILLECTOMY    . VARICOCELECTOMY      There were no vitals filed for this visit.      Subjective Assessment - 09/25/15 1347    Subjective "Pretty fair"   Currently in Pain? Yes   Pain Score 5    Pain Location --  Low back and neck  [facility] Adult PT Treatment/Exercise - 09/25/15 0001      Lumbar Exercises: Aerobic   UBE (Upper Arm Bike) L3 x3 min      Lumbar Exercises: Machines for Strengthening   Cybex Knee Extension 10lb 2x15   Cybex Knee Flexion 25lb 2x10     Lumbar Exercises: Standing   Other Standing Lumbar Exercises Shoulder ER red tband 2x10; Standing OHP yellow ball 2x10; cervical retractions 2x10   Other Standing Lumbar Exercises Trunk rotations with yellow ball 2x10     Lumbar Exercises: Seated   Sit to Stand 10 reps  x2 with froward chest press with yellow ball    Other Seated Lumbar Exercises Rows & Lats 25lb 2x15     Knee/Hip Exercises: Aerobic   Nustep L5 x 7 min      Modalities   Modalities Moist Heat;Electrical Stimulation     Moist Heat Therapy   Number Minutes Moist Heat 15 Minutes   Moist Heat Location Lumbar Spine     Electrical Stimulation   Electrical Stimulation Location  Lumbar area   Electrical Stimulation Action IFC   Electrical Stimulation Parameters supine   Electrical Stimulation Goals Pain                  PT Short Term Goals - 09/21/15 1516      PT SHORT TERM GOAL #1   Title independent with initial HEP   Status Partially Met           PT Long Term Goals - 09/25/15 1432      PT LONG TERM GOAL #1   Title understand proper posture and body mechanics   Status Partially Met     PT LONG TERM GOAL #2   Status Partially Met     PT LONG TERM GOAL #3   Title increase lumbar ROM 25%   Status Partially Met     PT LONG TERM GOAL #4   Title report being able to stand to cook a meal x 10 minutes without pain >6/10   Status Achieved               Plan - 09/25/15 1429    Clinical Impression Statement Pt continues to progress towards all goals. Pt reports that she is now able to stand at home to cook a meal without pain. No issues with today's exercises.   Rehab Potential Fair   PT Frequency 2x / week   PT Duration 8 weeks   PT Treatment/Interventions ADLs/Self Care Home Management;Moist Heat;Traction;Ultrasound;Electrical Stimulation;Cryotherapy;Patient/family education;Therapeutic exercise;Therapeutic activities;Manual techniques   PT Next Visit Plan exercises for flexibility and stability      Patient will benefit from skilled therapeutic intervention in order to improve the following deficits and impairments:  Abnormal gait, Decreased activity tolerance, Decreased mobility, Decreased range of motion, Decreased strength, Increased muscle spasms, Impaired flexibility, Postural dysfunction, Improper body mechanics, Pain  Visit Diagnosis: Midline low back pain without sciatica  Muscle spasm of back     Problem List Patient Active Problem List   Diagnosis Date Noted  . Carpal tunnel syndrome 12/26/2014  . Acute bronchitis 12/08/2014  . Hypersomnolence 12/08/2014  . Headache disorder 11/14/2014  . Paresthesia of  both Roberts 11/14/2014  . PCP NOTES >>>>>>>>>>>>>>>>>> 11/14/2014  . Rectal bleeding 11/08/2014  . Diarrhea 11/08/2014  . Renal cyst 11/08/2014  . Liver cyst 11/08/2014  . Abnormal CT of the abdomen 11/08/2014  . Annual physical exam 01/20/2014  . Chronic rheumatic arthritis ([place]) 01/20/2014  .  Skin infection 12/29/2013  . Skin lesion 07/19/2013  . Recurrent left inguinal hernia 11/02/2012  . Hemorrhoids 05/29/2012  . Left inguinal hernia 03/12/2011  . Diabetes type 2, controlled ([place]) 08/20/2010  . Hyperlipidemia 09/20/2009  . Elevated PSA--BPH-- Dr Risa Grill 07/21/2009  . Neck pain, radiculopathy, s/p surgery, on gabapentin 12/12/2008  . OTHER SPECIFIED DISORDER OF THE ESOPHAGUS 08/24/2008  . GERD 03/30/2007  . DJD (degenerative joint disease) 07/31/2006    Scot Jun, PTA  09/25/2015, 2:33 PM  [address] [place], [place], [address] Phone: [phone]   Fax:  [phone]  Name: Christian Roberts MRN: [id number] Date of Birth: 22-Sep-1945

## 2015-10-03 ENCOUNTER — Encounter: Payer: Self-pay | Admitting: Physical Therapy

## 2015-10-03 ENCOUNTER — Ambulatory Visit: Payer: BLUE CROSS/BLUE SHIELD | Admitting: Physical Therapy

## 2015-10-03 DIAGNOSIS — M545 Low back pain, unspecified: Secondary | ICD-10-CM

## 2015-10-03 DIAGNOSIS — M62830 Muscle spasm of back: Secondary | ICD-10-CM

## 2015-10-03 NOTE — Therapy (Signed)
[place] [address] [place], [place], [address] Phone: [phone]   Fax:  [phone]  Physical Therapy Treatment  Patient Details  Name: Christian Roberts MRN: [id number] Date of Birth: [birth year] Referring Provider: Posey Pronto  Encounter Date: 10/03/2015      PT End of Session - 10/03/15 1520    Visit Number 5   Date for PT Re-Evaluation 11/12/15   PT Start Time 1432   PT Stop Time 1531   PT Time Calculation (min) 59 min   Activity Tolerance Patient tolerated treatment well   Behavior During Therapy [facility] for tasks assessed/performed      Past Medical History:  Diagnosis Date  . Allergic rhinitis   . Asthma    "I outgrew it"  . Carpal tunnel syndrome   . Chronic rheumatic arthritis ([place]) 01/20/2014   Per Dr. Amil Amen   . Complete tear of rotator cuff 2016   Right, MRI in 2016, complete rupture of long head of the biceps as well as a tear of the subscapularis and supraspinatus tendons, Dr. Mardelle Matte  . Complication of anesthesia   . Constipation   . Diabetes mellitus    2012  . Elevated PSA   . GERD (gastroesophageal reflux disease)   . Headache(784.0)    headaches-related to sinuses & uses Tylenol   . Hemorrhoids   . Hernia   . Hyperlipidemia   . Nocturia   . Numbness of fingers   . Osteoarthritis    ankle- left, hands & neck & hip  . PONV (postoperative nausea and vomiting)    WITH ETHER  . Skin cancer, basal cell    sees derm    Past Surgical History:  Procedure Laterality Date  . ANKLE FUSION Left 03-19-13   Dr Mardelle Matte  . ANKLE SURGERY Left   . APPENDECTOMY     1964  . BACK SURGERY     cerv. fusion C1- T1, 2 separate surgeries , T1-T3 2014  . CARPAL TUNNEL RELEASE Right   . CARPAL TUNNEL RELEASE Left 07-14-2014   [place]  . CYSTOSCOPY     multiple times for infections  . INGUINAL HERNIA REPAIR  03/27/2011   Procedure: HERNIA REPAIR INGUINAL ADULT;  Surgeon: Harl Bowie, MD;  Location:  [facility];  Service: General;  Laterality: Left;  . INGUINAL HERNIA REPAIR Bilateral 12/09/2012   Procedure: LAPAROSCOPIC BILATERAL INGUINAL HERNIA REPAIR WITH MESH;  Surgeon: Harl Bowie, MD;  [facility];  Service: General;  Laterality: Bilateral;  . INSERTION OF MESH Bilateral 12/09/2012   Procedure: INSERTION OF MESH;  Surgeon: Harl Bowie, MD;  [facility];  Service: General;  Laterality: Bilateral;  . left ankle mass removed  1972   3 surgeries on L ankle- 73, 2008, 2009  . NECK SURGERY  03/11,    C3-C7  . RECTAL SURGERY  at birth   "[place]" SURGICAL CORRECTION  . ROTATOR CUFF REPAIR W/ DISTAL CLAVICLE EXCISION Right 08/25/2014   And acromioplasty, Dr. Mardelle Matte  . SHOULDER SURGERY  09/17/12  . TONSILLECTOMY    . VARICOCELECTOMY      There were no vitals filed for this visit.      Subjective Assessment - 10/03/15 1437    Subjective "Doing better" "Lower back not bothering me so much, my neck and ankle are still bothering me"   Currently in Pain? Yes   Pain Score 6    Pain Location --  L ankle and fingers            [facility] PT Assessment - 10/03/15 0001      AROM   Overall AROM Comments Cervcial ROM was decreaesd 25% with rotation, lumbar ROM WFL     Strength   Overall Strength Comments shoulders 3/5, LE's 4/5                     [facility] Adult PT Treatment/Exercise - 10/03/15 0001      Lumbar Exercises: Aerobic   Stationary Bike L0 x48mn     Lumbar Exercises: Machines for Strengthening   Cybex Knee Extension 10lb 2x15   Cybex Knee Flexion 25lb 2x10   Leg Press 20lb 2x15     Lumbar Exercises: Supine   Bridge 15 reps;1 second  x2   Other Supine Lumbar Exercises Bridge with physo ball 2x10   Other Supine Lumbar Exercises Ball squeezes and bridge x20                  PT Short Term Goals - 09/21/15 1516      PT SHORT TERM GOAL #1   Title independent with initial HEP   Status Partially Met            PT Long Term Goals - 10/03/15 1459      PT LONG TERM GOAL #1   Title understand proper posture and body mechanics   Status Partially Met     PT LONG TERM GOAL #2   Title decrease pain 25%   Status Partially Met     PT LONG TERM GOAL #3   Title increase lumbar ROM 25%   Status Achieved     PT LONG TERM GOAL #4   Title report being able to stand to cook a meal x 10 minutes without pain >6/10   Status Achieved               Plan - 10/03/15 1520    Clinical Impression Statement Pt had progressed towards all goals. Pt has also increase his lumbar ROM. PT reports difficulty with bridges on physo ball. No reports of increase pain during today's interventions.   Rehab Potential Fair   PT Frequency 2x / week   PT Duration 8 weeks   PT Treatment/Interventions ADLs/Self Care Home Management;Moist Heat;Traction;Ultrasound;Electrical Stimulation;Cryotherapy;Patient/family education;Therapeutic exercise;Therapeutic activities;Manual techniques   PT Next Visit Plan exercises for flexibility and stability      Patient will benefit from skilled therapeutic intervention in order to improve the following deficits and impairments:  Abnormal gait, Decreased activity tolerance, Decreased mobility, Decreased range of motion, Decreased strength, Increased muscle spasms, Impaired flexibility, Postural dysfunction, Improper body mechanics, Pain  Visit Diagnosis: Midline low back pain without sciatica  Muscle spasm of back     Problem List Patient Active Problem List   Diagnosis Date Noted  . Carpal tunnel syndrome 12/26/2014  . Acute bronchitis 12/08/2014  . Hypersomnolence 12/08/2014  . Headache disorder 11/14/2014  . Paresthesia of both hands 11/14/2014  . PCP NOTES >>>>>>>>>>>>>>>>>> 11/14/2014  . Rectal bleeding 11/08/2014  . Diarrhea 11/08/2014  . Renal cyst 11/08/2014  . Liver cyst 11/08/2014  . Abnormal CT of the abdomen 11/08/2014  . Annual physical exam 01/20/2014  .  Chronic rheumatic arthritis ([place] 01/20/2014  . Skin infection 12/29/2013  . Skin lesion 07/19/2013  . Recurrent left inguinal hernia 11/02/2012  . Hemorrhoids 05/29/2012  . Left inguinal hernia 03/12/2011  . Diabetes type 2, controlled (  [place]) 08/20/2010  . Hyperlipidemia 09/20/2009  . Elevated PSA--BPH-- Dr Risa Grill 07/21/2009  . Neck pain, radiculopathy, s/p surgery, on gabapentin 12/12/2008  . OTHER SPECIFIED DISORDER OF THE ESOPHAGUS 08/24/2008  . GERD 03/30/2007  . DJD (degenerative joint disease) 07/31/2006    Scot Jun 10/03/2015, 3:24 PM  [place] [address] [place], [place], [address] Phone: [phone]   Fax:  [phone]  Name: JAYTON POPELKA MRN: [id number] Date of Birth: 22-Jul-1945

## 2015-10-05 ENCOUNTER — Ambulatory Visit: Payer: BLUE CROSS/BLUE SHIELD | Admitting: Physical Therapy

## 2015-10-05 ENCOUNTER — Ambulatory Visit: Payer: BLUE CROSS/BLUE SHIELD | Admitting: Physical Medicine & Rehabilitation

## 2015-10-13 ENCOUNTER — Ambulatory Visit: Payer: BLUE CROSS/BLUE SHIELD | Admitting: Physical Medicine & Rehabilitation

## 2015-10-17 ENCOUNTER — Encounter: Payer: BLUE CROSS/BLUE SHIELD | Attending: Physical Medicine & Rehabilitation | Admitting: Registered Nurse

## 2015-10-17 ENCOUNTER — Encounter: Payer: Self-pay | Admitting: Registered Nurse

## 2015-10-17 VITALS — BP 112/66 | HR 95

## 2015-10-17 DIAGNOSIS — E119 Type 2 diabetes mellitus without complications: Secondary | ICD-10-CM | POA: Insufficient documentation

## 2015-10-17 DIAGNOSIS — G43009 Migraine without aura, not intractable, without status migrainosus: Secondary | ICD-10-CM

## 2015-10-17 DIAGNOSIS — G8929 Other chronic pain: Secondary | ICD-10-CM | POA: Diagnosis not present

## 2015-10-17 DIAGNOSIS — M545 Low back pain, unspecified: Secondary | ICD-10-CM

## 2015-10-17 DIAGNOSIS — G479 Sleep disorder, unspecified: Secondary | ICD-10-CM | POA: Diagnosis not present

## 2015-10-17 DIAGNOSIS — E785 Hyperlipidemia, unspecified: Secondary | ICD-10-CM | POA: Insufficient documentation

## 2015-10-17 DIAGNOSIS — K219 Gastro-esophageal reflux disease without esophagitis: Secondary | ICD-10-CM | POA: Insufficient documentation

## 2015-10-17 DIAGNOSIS — M159 Polyosteoarthritis, unspecified: Secondary | ICD-10-CM

## 2015-10-17 DIAGNOSIS — M542 Cervicalgia: Secondary | ICD-10-CM | POA: Diagnosis not present

## 2015-10-17 DIAGNOSIS — M797 Fibromyalgia: Secondary | ICD-10-CM | POA: Insufficient documentation

## 2015-10-17 DIAGNOSIS — R296 Repeated falls: Secondary | ICD-10-CM

## 2015-10-17 NOTE — Patient Instructions (Signed)
Take Cymbalta every other day for a week then discontinue.  If you notice and adverse effects please call our office  [phone]

## 2015-10-17 NOTE — Progress Notes (Signed)
Subjective:    Patient ID: Christian Roberts, male    DOB: 05-31-1945, 70 y.o.   MRN: [id number]  HPI: Christian Roberts is a 70 year old male who returns for follow up appointment for chronic pain. He states his pain is located in his neck, lower back, right leg and ankles. He rates his pain 8. His current exercise regime is walking, attending [facility] 3-4 times a week and Physical Therapy weekly. Christian Roberts states since he was prescribed Cymbalta he has experienced lightheadedness and daytime drowsiness with frequent falls. He has had a total of four falls he states, the only medication change was Cymbalta. A few weeks ago he was sitting in a chair when he began to stand up he became drowsy and hit his head on the door frame, he didn't seek medical attention. He didn't call office regarding his frequent falls or lightheadedness and daytime drowsiness. His Cymbalta will be weaned to every other day for a week and discontinued he verbalizes understanding. Also instructed to call office if he notices any adverse effects he verbalizes understanding. Medication list reviewed he's currently taking gabapentin and Topamax, and states he wasn't having any frequent falls, daytime drowsiness or lightheadedness while on these medications.  The above discussed with Dr. Posey Pronto and he is in agreement with plan.   Pain Inventory Average Pain 7 Pain Right Now 8 My pain is sharp, burning, tingling and aching  In the last 24 hours, has pain interfered with the following? General activity 7 Relation with others 5 Enjoyment of life 7 What TIME of day is your pain at its worst? n/a Sleep (in general) NA  Pain is worse with: bending, sitting and inactivity Pain improves with: therapy/exercise and TENS Relief from Meds: n/a  Mobility walk with assistance how many minutes can you walk? 5 to 60  Function retired  Neuro/Psych bladder control problems numbness tingling dizziness  Prior  Studies Any changes since last visit?  no  Physicians involved in your care Any changes since last visit?  no   Family History  Problem Relation Age of Onset  . Stroke Mother   . Anesthesia problems Mother   . Cerebral aneurysm Father   . Skin cancer Father     melanoma  . Stroke Father   . Diabetes Sister     hypoglycemia  . Breast cancer Sister     x 2  . Lung cancer Sister   . Atrial fibrillation Sister   . Lung cancer Sister   . Emphysema Sister   . Atrial fibrillation Sister   . Colon cancer Neg Hx   . Prostate cancer Neg Hx    Social History   Social History  . Marital status: Married    Spouse name: Arbie Cookey  . Number of children: 3  . Years of education: BA+   Occupational History  . 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 Smoking status: Current Every Day Smoker    Packs/day: 5.00    Types: Cigarettes    Last attempt to quit: 04/10/2012  . Smokeless tobacco: Never Used     Comment: quit on-off since 2014   . Alcohol use No  . Drug use: No  . Sexual activity: Not on file   Other Topics Concern  . Not on file   Social History Narrative   Married , lives w/ wife   Right-handed   Rare caffeine intake  Past Surgical History:  Procedure Laterality Date  . ANKLE FUSION Left 03-19-13   Dr Mardelle Matte  . ANKLE SURGERY Left   . APPENDECTOMY     1964  . BACK SURGERY     cerv. fusion C1- T1, 2 separate surgeries , T1-T3 2014  . CARPAL TUNNEL RELEASE Right   . CARPAL TUNNEL RELEASE Left 07-14-2014   [facility]  . CYSTOSCOPY     multiple times for infections  . INGUINAL HERNIA REPAIR  03/27/2011   Procedure: HERNIA REPAIR INGUINAL ADULT;  Surgeon: Harl Bowie, MD;  Location: [place];  Service: General;  Laterality: Left;  . INGUINAL HERNIA REPAIR Bilateral 12/09/2012   Procedure: LAPAROSCOPIC BILATERAL INGUINAL HERNIA REPAIR WITH MESH;  Surgeon: Harl Bowie, MD;  [facility];  Service: General;  Laterality: Bilateral;  .  INSERTION OF MESH Bilateral 12/09/2012   Procedure: INSERTION OF MESH;  Surgeon: Harl Bowie, MD;  [facility];  Service: General;  Laterality: Bilateral;  . left ankle mass removed  1972   3 surgeries on L ankle- 73, 2008, 2009  . NECK SURGERY  03/11,    C3-C7  . RECTAL SURGERY  at birth   "[place]" SURGICAL CORRECTION  . ROTATOR CUFF REPAIR W/ DISTAL CLAVICLE EXCISION Right 08/25/2014   And acromioplasty, Dr. Mardelle Matte  . SHOULDER SURGERY  09/17/12  . TONSILLECTOMY    . VARICOCELECTOMY     Past Medical History:  Diagnosis Date  . Allergic rhinitis   . Asthma    "I outgrew it"  . Carpal tunnel syndrome   . Chronic rheumatic arthritis ([place]) 01/20/2014   Per Dr. Amil Amen   . Complete tear of rotator cuff 2016   Right, MRI in 2016, complete rupture of long head of the biceps as well as a tear of the subscapularis and supraspinatus tendons, Dr. Mardelle Matte  . Complication of anesthesia   . Constipation   . Diabetes mellitus    2012  . Elevated PSA   . GERD (gastroesophageal reflux disease)   . Headache(784.0)    headaches-related to sinuses & uses Tylenol   . Hemorrhoids   . Hernia   . Hyperlipidemia   . Nocturia   . Numbness of fingers   . Osteoarthritis    ankle- left, hands & neck & hip  . PONV (postoperative nausea and vomiting)    WITH ETHER  . Skin cancer, basal cell    sees derm   There were no vitals taken for this visit.  Opioid Risk Score:   Fall Risk Score:  `1  Depression screen PHQ 2/9  Depression screen [facility] 2/9 08/24/2015 05/24/2015 01/26/2015 11/15/2014 09/17/2013 07/28/2012  Decreased Interest 2 0 0 0 0 0  Down, Depressed, Hopeless 2 0 0 0 0 0  PHQ - 2 Score 4 0 0 0 0 0  Altered sleeping 3 - - - - -  Tired, decreased energy 3 - - - - -  Change in appetite 2 - - - - -  Feeling bad or failure about yourself  0 - - - - -  Trouble concentrating 2 - - - - -  Moving slowly or fidgety/restless 0 - - - - -  Suicidal thoughts 0 - - - - -  PHQ-9  Score 14 - - - - -  Difficult doing work/chores Very difficult - - - - -    Review of Systems  Respiratory: Positive for cough.   Genitourinary: Positive for dysuria.  Musculoskeletal: Positive for joint swelling.  Neurological: Positive for dizziness and numbness.       Tingling  All other systems reviewed and are negative.      Objective:   Physical Exam  Constitutional: He is oriented to person, place, and time. He appears well-developed and well-nourished.  HENT:  Head: Normocephalic and atraumatic.  Neck: Normal range of motion. Neck supple.  Cardiovascular: Normal rate and regular rhythm.   Pulmonary/Chest: Effort normal and breath sounds normal.  Musculoskeletal:  Normal Muscle Bulk and Muscle testing Reveals: Upper Extremities: Full ROM and Muscle Strength 5/5 Lumbar Paraspinal Tenderness: L-3- L-5 Lower Extremities: Full ROM and Muscle Strength 5/5 Arises from chair with ease' Narrow based Gait  Neurological: He is alert and oriented to person, place, and time.  Skin: Skin is warm and dry.  Psychiatric: He has a normal mood and affect.  Nursing note and vitals reviewed.         Assessment & Plan:  1. Chronic low back pain: Continue HEP, use heat and Ice Therapy. Bio-wave pending 2. Migraines: Continue Maxalt and Topamax 3. Sleep disturbance: No complaints today continue to monitor. 4. Generalized OA: Continue HEP and PCP Following 5. Fibromyalgia: Continue HEP and Gabapentin 6. Frequent Falls: Wean Cymbalta and Discontinue.  30 minutes of face to face patient care time was spent during this visit. All questions were encouraged and answered.

## 2015-10-18 ENCOUNTER — Encounter: Payer: Self-pay | Admitting: Internal Medicine

## 2015-10-18 ENCOUNTER — Ambulatory Visit (INDEPENDENT_AMBULATORY_CARE_PROVIDER_SITE_OTHER): Payer: BLUE CROSS/BLUE SHIELD | Admitting: Internal Medicine

## 2015-10-18 VITALS — BP 104/58 | HR 84 | Temp 98.1°F | Resp 16 | Ht 64.0 in | Wt 129.0 lb

## 2015-10-18 DIAGNOSIS — Z23 Encounter for immunization: Secondary | ICD-10-CM

## 2015-10-18 DIAGNOSIS — B369 Superficial mycosis, unspecified: Secondary | ICD-10-CM

## 2015-10-18 DIAGNOSIS — M159 Polyosteoarthritis, unspecified: Secondary | ICD-10-CM | POA: Diagnosis not present

## 2015-10-18 MED ORDER — KETOCONAZOLE 2 % EX CREA: 1.0000 "application " | TOPICAL_CREAM | Freq: Every day | CUTANEOUS | 1 refills | Status: AC

## 2015-10-18 NOTE — Patient Instructions (Signed)
Apply the cream twice a day between all your toes for the next 10 day   Once better, use talc or powder to keep your feet dry  Avoid public showers  Call if not improving

## 2015-10-18 NOTE — Progress Notes (Signed)
Subjective:    Patient ID: Christian Roberts, male    DOB: [birth year], 70 y.o.   MRN: [id number]  DOS:  10/18/2015 Type of visit - description : acute Interval history: His main concern today is rash between the toes going on for a few weeks. He wonders if is related to using the showers at the gym.  Also, started to see pain management few months ago, was prescribed Cymbalta, since then he is getting extremely dizzy when he gets up, had 4 falls, during one of the episodes  he felt that he probably lost consciousness. Did not seek medical attention. No associated symptoms. Already discussed with pain management, they are weaning him off Cymbalta.  BP Readings from Last 3 Encounters:  10/18/15 (!) 104/58  10/17/15 112/66  08/29/15 112/62    Review of Systems BP is low however the patient states is feeling "fantastic" Denies chest pain or palpitations No neck pain, headaches on and off as usual. Denies a stroke symptoms such as diplopia, slurred speech or motor deficits   Past Medical History:  Diagnosis Date  . Allergic rhinitis   . Asthma    "I outgrew it"  . Carpal tunnel syndrome   . Chronic rheumatic arthritis ([place]) 01/20/2014   Per Dr. Amil Amen   . Complete tear of rotator cuff 2016   Right, MRI in 2016, complete rupture of long head of the biceps as well as a tear of the subscapularis and supraspinatus tendons, Dr. Mardelle Matte  . Complication of anesthesia   . Constipation   . Diabetes mellitus    2012  . Elevated PSA   . GERD (gastroesophageal reflux disease)   . Headache(784.0)    headaches-related to sinuses & uses Tylenol   . Hemorrhoids   . Hernia   . Hyperlipidemia   . Nocturia   . Numbness of fingers   . Osteoarthritis    ankle- left, hands & neck & hip  . PONV (postoperative nausea and vomiting)    WITH ETHER  . Skin cancer, basal cell    sees derm    Past Surgical History:  Procedure Laterality Date  . ANKLE FUSION Left 03-19-13   Dr Mardelle Matte  . ANKLE  SURGERY Left   . APPENDECTOMY     1964  . BACK SURGERY     cerv. fusion C1- T1, 2 separate surgeries , T1-T3 2014  . CARPAL TUNNEL RELEASE Right   . CARPAL TUNNEL RELEASE Left 07-14-2014   [facility]  . CYSTOSCOPY     multiple times for infections  . INGUINAL HERNIA REPAIR  03/27/2011   Procedure: HERNIA REPAIR INGUINAL ADULT;  Surgeon: Harl Bowie, MD;  Location: [place];  Service: General;  Laterality: Left;  . INGUINAL HERNIA REPAIR Bilateral 12/09/2012   Procedure: LAPAROSCOPIC BILATERAL INGUINAL HERNIA REPAIR WITH MESH;  Surgeon: Harl Bowie, MD;  [facility];  Service: General;  Laterality: Bilateral;  . INSERTION OF MESH Bilateral 12/09/2012   Procedure: INSERTION OF MESH;  Surgeon: Harl Bowie, MD;  [facility];  Service: General;  Laterality: Bilateral;  . left ankle mass removed  1972   3 surgeries on L ankle- 73, 2008, 2009  . NECK SURGERY  03/11,    C3-C7  . RECTAL SURGERY  at birth   "[place]" SURGICAL CORRECTION  . ROTATOR CUFF REPAIR W/ DISTAL CLAVICLE EXCISION Right 08/25/2014   And acromioplasty, Dr. Mardelle Matte  . SHOULDER SURGERY  09/17/12  . TONSILLECTOMY    .  VARICOCELECTOMY      Social History   Social History  . Marital status: Married    Spouse name: Arbie Cookey  . Number of children: 3  . Years of education: BA+   Occupational History  . 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 Smoking status: Current Every Day Smoker    Packs/day: 5.00    Types: Cigarettes    Last attempt to quit: 04/10/2012  . Smokeless tobacco: Never Used     Comment: quit on-off since 2014   . Alcohol use No  . Drug use: No  . Sexual activity: Not Currently   Other Topics Concern  . Not on file   Social History Narrative   Married , lives w/ wife   Right-handed   Rare caffeine intake           Medication List       Accurate as of 10/18/15  3:38 PM. Always use your most recent med list.          acetaminophen 500 MG  tablet Commonly known as:  TYLENOL Take 500-1,000 mg by mouth every 4 (four) hours as needed. For pain   albuterol 108 (90 Base) MCG/ACT inhaler Commonly known as:  VENTOLIN HFA Inhale 2 puffs into the lungs every 6 (six) hours as needed for wheezing or shortness of breath.   BENADRYL 25 MG tablet Generic drug:  diphenhydrAMINE Take 25 mg by mouth every 6 (six) hours as needed.   BIOFREEZE ROLL-ON EX Apply topically.   DOCUSATE SODIUM PO Take by mouth. Reported on 01/26/2015   ENBREL 50 MG/ML injection Generic drug:  etanercept Inject 50 mg into the skin once a week.   esomeprazole 10 MG packet Commonly known as:  NEXIUM Take 10 mg by mouth daily before breakfast.   fish oil-omega-3 fatty acids 1000 MG capsule Take 1 g by mouth 2 (two) times daily.   fluticasone 50 MCG/ACT nasal spray Commonly known as:  FLONASE Place 1 spray into both nostrils daily.   folic acid 1 MG tablet Commonly known as:  FOLVITE Take 1 mg by mouth daily.   freestyle lancets Check blood sugars no more than twice daily.   gabapentin 300 MG capsule Commonly known as:  NEURONTIN Take 1 capsule (300 mg total) by mouth 4 (four) times daily.   GAS-X PO Take by mouth. Reported on 01/26/2015   ACCU-CHEK SMARTVIEW test strip Generic drug:  glucose blood Reported on 05/24/2015   glucose blood test strip Commonly known as:  FREESTYLE LITE Check blood sugar no more than twice daily.   hydrocortisone 2.5 % cream Apply 1 application topically 2 (two) times daily. Reported on 01/26/2015   lidocaine 5 % Commonly known as:  LIDODERM Place 2 patches onto the skin daily.   METAMUCIL PO Take by mouth.   metFORMIN 1000 MG tablet Commonly known as:  GLUCOPHAGE Take 1 tablet (1,000 mg total) by mouth 2 (two) times daily with a meal.   Methotrexate (PF) 20 MG/0.4ML Soaj Inject into the skin. Once a week   MYRBETRIQ 50 MG Tb24 tablet Generic drug:  mirabegron ER   ONE TOUCH ULTRA SYSTEM KIT  w/Device Kit Use device to check blood sugar.   predniSONE 5 MG tablet Commonly known as:  DELTASONE Begin taking 6 tablets daily, taper by one tablet daily until off the medication.   PROCTOSOL HC 2.5 % rectal cream Generic drug:  hydrocortisone Reported on 01/26/2015   Pseudoephedrine HCl ([place])  30 MG Taba Take 1 tablet by mouth as needed. Reported on 01/26/2015   rizatriptan 10 MG disintegrating tablet Commonly known as:  MAXALT-MLT Take 1 tablet PO at onset of migraine- may repeat in two hours if needed.   simvastatin 20 MG tablet Commonly known as:  ZOCOR Take 1 tablet (20 mg total) by mouth at bedtime.   tamsulosin 0.4 MG Caps capsule Commonly known as:  FLOMAX Take 0.4 mg by mouth daily after supper.   topiramate 100 MG tablet Commonly known as:  TOPAMAX Take 1 tablet (100 mg total) by mouth 2 (two) times daily.   XIIDRA 5 % Soln Generic drug:  Lifitegrast Apply to eye.          Objective:   Physical Exam BP (!) 104/58 (BP Location: Right Arm, Patient Position: Sitting, Cuff Size: Normal)   Pulse 84   Temp 98.1 F (36.7 C) (Oral)   Resp 16   Ht 5' 4" (1.626 m)   Wt 129 lb (58.5 kg)   SpO2 97%   BMI 22.14 kg/m  General:   Well developed, well nourished . NAD.  HEENT:  Normocephalic . Face symmetric, atraumatic Lungs:  CTA B Normal respiratory effort, no intercostal retractions, no accessory muscle use. Heart: RRR,  no murmur.  No pretibial edema bilaterally  Skin: Between the toes, there is some maceration and scaly skin. No redness, swelling or tenderness to palpation. Neurologic:  alert & oriented X3.  Speech normal, gait appropriate for age , assisted by a cane. Psych--  Cognition and judgment appear intact.  Cooperative with normal attention span and concentration.  Behavior appropriate. No anxious or depressed appearing.      Assessment & Plan:   Assessment  DM 2012 Hyperlipidemia zocor  GERD Abnormal gallbladder per CT-- Saw  surgery 12-16: No surgery rx MSK: ---Pain mngmt: reports a severe allergic reaction to  NSAIDs-codeine-MSO4 ; can tolerate oxycodone but the pain relief is just mild ; intolerant to Cymbalta 10-2015 ---Rheumatoid arthritis -- on enbrel - MTX (Dr Elpidio Galea) ---DJD multiple sites, cervical spondylosis -- gabapentin ---CTS Headaches --topamax GU: sees urology --Elevated PSA --BPH Skin cancer, BCC  PLAN: Dermatomycoses: Nizoral, once better keep the feet dry. See instructions Pain management: Under the care off pain management since August 2017, they prescribe Cymbalta, apparently that is causing severe dizziness when he  stands up, had 4 falls, denies neck pain or other unusual aches and pains. They are already weaning off Cymbalta. BP today is a slightly low but he feels well today. Flu shot today RTC next month as recommended

## 2015-10-18 NOTE — Progress Notes (Signed)
Pre visit review using our clinic review tool, if applicable. No additional management support is needed unless otherwise documented below in the visit note. 

## 2015-10-19 NOTE — Assessment & Plan Note (Signed)
Dermatomycoses: Nizoral, once better keep the feet dry. See instructions Pain management: Under the care off pain management since August 2017, they prescribe Cymbalta, apparently that is causing severe dizziness when he  stands up, had 4 falls, denies neck pain or other unusual aches and pains. They are already weaning off Cymbalta. BP today is a slightly low but he feels well today. Flu shot today RTC next month as recommended

## 2015-10-24 ENCOUNTER — Encounter: Payer: BLUE CROSS/BLUE SHIELD | Admitting: Physical Therapy

## 2015-10-25 ENCOUNTER — Encounter: Payer: Self-pay | Admitting: Physical Therapy

## 2015-10-25 ENCOUNTER — Ambulatory Visit: Payer: BLUE CROSS/BLUE SHIELD | Attending: Physical Medicine & Rehabilitation | Admitting: Physical Therapy

## 2015-10-25 DIAGNOSIS — M545 Low back pain, unspecified: Secondary | ICD-10-CM

## 2015-10-25 DIAGNOSIS — G8929 Other chronic pain: Secondary | ICD-10-CM | POA: Insufficient documentation

## 2015-10-25 DIAGNOSIS — M62830 Muscle spasm of back: Secondary | ICD-10-CM | POA: Diagnosis present

## 2015-10-25 NOTE — Therapy (Signed)
[facility] [place], [place], [address] Phone: [phone]   Fax:  [phone]  Physical Therapy Treatment  Patient Details  Name: Christian Roberts MRN: [id number] Date of Birth: 05/12/1945 Referring Provider: Posey Pronto  Encounter Date: 10/25/2015      PT End of Session - 10/25/15 1513    Visit Number 6   Date for PT Re-Evaluation 11/12/15   PT Start Time 1429   PT Stop Time 1527   PT Time Calculation (min) 58 min   Activity Tolerance Patient tolerated treatment well   Behavior During Therapy [facility] for tasks assessed/performed      Past Medical History:  Diagnosis Date  . Allergic rhinitis   . Asthma    "I outgrew it"  . Carpal tunnel syndrome   . Chronic rheumatic arthritis ([place]) 01/20/2014   Per Dr. Amil Amen   . Complete tear of rotator cuff 2016   Right, MRI in 2016, complete rupture of long head of the biceps as well as a tear of the subscapularis and supraspinatus tendons, Dr. Mardelle Matte  . Complication of anesthesia   . Constipation   . Diabetes mellitus    2012  . Elevated PSA   . GERD (gastroesophageal reflux disease)   . Headache(784.0)    headaches-related to sinuses & uses Tylenol   . Hemorrhoids   . Hernia   . Hyperlipidemia   . Nocturia   . Numbness of fingers   . Osteoarthritis    ankle- left, hands & neck & hip  . PONV (postoperative nausea and vomiting)    WITH ETHER  . Skin cancer, basal cell    sees derm    Past Surgical History:  Procedure Laterality Date  . ANKLE FUSION Left 03-19-13   Dr Mardelle Matte  . ANKLE SURGERY Left   . APPENDECTOMY     1964  . BACK SURGERY     cerv. fusion C1- T1, 2 separate surgeries , T1-T3 2014  . CARPAL TUNNEL RELEASE Right   . CARPAL TUNNEL RELEASE Left 07-14-2014   [facility]  . CYSTOSCOPY     multiple times for infections  . INGUINAL HERNIA REPAIR  03/27/2011   Procedure: HERNIA REPAIR INGUINAL ADULT;  Surgeon: Harl Bowie, MD;  Location:  [place];  Service: General;  Laterality: Left;  . INGUINAL HERNIA REPAIR Bilateral 12/09/2012   Procedure: LAPAROSCOPIC BILATERAL INGUINAL HERNIA REPAIR WITH MESH;  Surgeon: Harl Bowie, MD;  [facility];  Service: General;  Laterality: Bilateral;  . INSERTION OF MESH Bilateral 12/09/2012   Procedure: INSERTION OF MESH;  Surgeon: Harl Bowie, MD;  [facility];  Service: General;  Laterality: Bilateral;  . left ankle mass removed  1972   3 surgeries on L ankle- 73, 2008, 2009  . NECK SURGERY  03/11,    C3-C7  . RECTAL SURGERY  at birth   "[place]" SURGICAL CORRECTION  . ROTATOR CUFF REPAIR W/ DISTAL CLAVICLE EXCISION Right 08/25/2014   And acromioplasty, Dr. Mardelle Matte  . SHOULDER SURGERY  09/17/12  . TONSILLECTOMY    . VARICOCELECTOMY      There were no vitals filed for this visit.      Subjective Assessment - 10/25/15 1431    Subjective Pt reports that he did have a fall Friday, he stated that the tripped over his own foot when he was getting out of a high chair. Pt didn't not seek medical attention from his  fall.   Currently in Pain? Yes   Pain Score 6   ankle, back of neck, R shin, and kidney area                         [facility] Adult PT Treatment/Exercise - 10/25/15 0001      Lumbar Exercises: Stretches   Passive Hamstring Stretch 3 reps;10 seconds     Lumbar Exercises: Aerobic   Stationary Bike NuStep L4 x 6 min    UBE (Upper Arm Bike) L3 x 19fd/2rev     Lumbar Exercises: Machines for Strengthening   Leg Press 20lb 2x15     Lumbar Exercises: Seated   Sit to Stand 10 reps  x2 holding yellow ball    Other Seated Lumbar Exercises Rows & Lats 25lb 2x15; back ext black band 2x10     Lumbar Exercises: Supine   Other Supine Lumbar Exercises Bridge, K2C with physo ball 2x15   Other Supine Lumbar Exercises Ball squeezes and bridge x20     Modalities   Modalities Moist Heat;Electrical Stimulation     Moist Heat Therapy    Number Minutes Moist Heat 15 Minutes   Moist Heat Location Lumbar Spine     Electrical Stimulation   Electrical Stimulation Location Lumbar area   Electrical Stimulation Action IFC   Electrical Stimulation Parameters supine   Electrical Stimulation Goals Pain                  PT Short Term Goals - 09/21/15 1516      PT SHORT TERM GOAL #1   Title independent with initial HEP   Status Partially Met           PT Long Term Goals - 10/03/15 1459      PT LONG TERM GOAL #1   Title understand proper posture and body mechanics   Status Partially Met     PT LONG TERM GOAL #2   Title decrease pain 25%   Status Partially Met     PT LONG TERM GOAL #3   Title increase lumbar ROM 25%   Status Achieved     PT LONG TERM GOAL #4   Title report being able to stand to cook a meal x 10 minutes without pain >6/10   Status Achieved               Plan - 10/25/15 1513    Clinical Impression Statement Pt reports that he has just returned back from visiting his daughter, he reports having a fall Friday and did not seek medical attention. Pt reports multiple pain sited pre treatment but note of them increased during today's treatment session. Bilat tight HS noted with passive HS stretching   Rehab Potential Fair   PT Frequency 2x / week   PT Duration 8 weeks   PT Treatment/Interventions ADLs/Self Care Home Management;Moist Heat;Traction;Ultrasound;Electrical Stimulation;Cryotherapy;Patient/family education;Therapeutic exercise;Therapeutic activities;Manual techniques   PT Next Visit Plan exercises for flexibility and stability      Patient will benefit from skilled therapeutic intervention in order to improve the following deficits and impairments:  Abnormal gait, Decreased activity tolerance, Decreased mobility, Decreased range of motion, Decreased strength, Increased muscle spasms, Impaired flexibility, Postural dysfunction, Improper body mechanics, Pain  Visit  Diagnosis: Chronic midline low back pain without sciatica  Muscle spasm of back     Problem List Patient Active Problem List   Diagnosis Date Noted  . Carpal tunnel syndrome 12/26/2014  .  Acute bronchitis 12/08/2014  . Hypersomnolence 12/08/2014  . Headache disorder 11/14/2014  . Paresthesia of both hands 11/14/2014  . PCP NOTES >>>>>>>>>>>>>>>>>> 11/14/2014  . Rectal bleeding 11/08/2014  . Diarrhea 11/08/2014  . Renal cyst 11/08/2014  . Liver cyst 11/08/2014  . Abnormal CT of the abdomen 11/08/2014  . Annual physical exam 01/20/2014  . Chronic rheumatic arthritis ([place]) 01/20/2014  . Skin infection 12/29/2013  . Skin lesion 07/19/2013  . Recurrent left inguinal hernia 11/02/2012  . Hemorrhoids 05/29/2012  . Left inguinal hernia 03/12/2011  . Diabetes type 2, controlled ([place]) 08/20/2010  . Hyperlipidemia 09/20/2009  . Elevated PSA--BPH-- Dr Risa Grill 07/21/2009  . Neck pain, radiculopathy, s/p surgery, on gabapentin 12/12/2008  . OTHER SPECIFIED DISORDER OF THE ESOPHAGUS 08/24/2008  . GERD 03/30/2007  . DJD (degenerative joint disease) 07/31/2006    Scot Jun, PTA  10/25/2015, 3:15 PM  [facility] [place], [place], [address] Phone: [phone]   Fax:  [phone]  Name: KAYSHAUN POLANCO MRN: [id number] Date of Birth: 05/19/1945

## 2015-10-26 ENCOUNTER — Encounter: Payer: Self-pay | Admitting: Physical Therapy

## 2015-10-26 ENCOUNTER — Ambulatory Visit: Payer: BLUE CROSS/BLUE SHIELD | Admitting: Physical Therapy

## 2015-10-26 DIAGNOSIS — M545 Low back pain, unspecified: Secondary | ICD-10-CM

## 2015-10-26 DIAGNOSIS — M62830 Muscle spasm of back: Secondary | ICD-10-CM

## 2015-10-26 DIAGNOSIS — G8929 Other chronic pain: Secondary | ICD-10-CM

## 2015-10-26 NOTE — Therapy (Signed)
[facility] [place], [place], [address] Phone: [phone]   Fax:  [phone]  Physical Therapy Treatment  Patient Details  Name: YOUCEF KLAS MRN: [id number] Date of Birth: [birth year] Referring Provider: Posey Pronto  Encounter Date: 10/26/2015      PT End of Session - 10/26/15 1512    Visit Number 7   Date for PT Re-Evaluation 11/12/15   PT Start Time 1430   PT Stop Time 1512   PT Time Calculation (min) 42 min   Activity Tolerance Patient tolerated treatment well   Behavior During Therapy [facility] for tasks assessed/performed      Past Medical History:  Diagnosis Date  . Allergic rhinitis   . Asthma    "I outgrew it"  . Carpal tunnel syndrome   . Chronic rheumatic arthritis ([place]) 01/20/2014   Per Dr. Amil Amen   . Complete tear of rotator cuff 2016   Right, MRI in 2016, complete rupture of long head of the biceps as well as a tear of the subscapularis and supraspinatus tendons, Dr. Mardelle Matte  . Complication of anesthesia   . Constipation   . Diabetes mellitus    2012  . Elevated PSA   . GERD (gastroesophageal reflux disease)   . Headache(784.0)    headaches-related to sinuses & uses Tylenol   . Hemorrhoids   . Hernia   . Hyperlipidemia   . Nocturia   . Numbness of fingers   . Osteoarthritis    ankle- left, hands & neck & hip  . PONV (postoperative nausea and vomiting)    WITH ETHER  . Skin cancer, basal cell    sees derm    Past Surgical History:  Procedure Laterality Date  . ANKLE FUSION Left 03-19-13   Dr Mardelle Matte  . ANKLE SURGERY Left   . APPENDECTOMY     1964  . BACK SURGERY     cerv. fusion C1- T1, 2 separate surgeries , T1-T3 2014  . CARPAL TUNNEL RELEASE Right   . CARPAL TUNNEL RELEASE Left 07-14-2014   [place]  . CYSTOSCOPY     multiple times for infections  . INGUINAL HERNIA REPAIR  03/27/2011   Procedure: HERNIA REPAIR INGUINAL ADULT;  Surgeon: Harl Bowie, MD;  Location:  [facility];  Service: General;  Laterality: Left;  . INGUINAL HERNIA REPAIR Bilateral 12/09/2012   Procedure: LAPAROSCOPIC BILATERAL INGUINAL HERNIA REPAIR WITH MESH;  Surgeon: Harl Bowie, MD;  Location: [facility];  Service: General;  Laterality: Bilateral;  . INSERTION OF MESH Bilateral 12/09/2012   Procedure: INSERTION OF MESH;  Surgeon: Harl Bowie, MD;  Location: [facility];  Service: General;  Laterality: Bilateral;  . left ankle mass removed  1972   3 surgeries on L ankle- 73, 2008, 2009  . NECK SURGERY  03/11,    C3-C7  . RECTAL SURGERY  at birth   "[place]" SURGICAL CORRECTION  . ROTATOR CUFF REPAIR W/ DISTAL CLAVICLE EXCISION Right 08/25/2014   And acromioplasty, Dr. Mardelle Matte  . SHOULDER SURGERY  09/17/12  . TONSILLECTOMY    . VARICOCELECTOMY      There were no vitals filed for this visit.      Subjective Assessment - 10/26/15 1433    Subjective "Im fair"    Currently in Pain? Yes   Pain Score 6    Pain Location --  Neck & L ankle  [place] Adult PT Treatment/Exercise - 10/26/15 0001      Lumbar Exercises: Aerobic   Stationary Bike NuStep L4 x 6 min    UBE (Upper Arm Bike) L3 x 2 frd/2rev     Lumbar Exercises: Machines for Strengthening   Cybex Knee Extension 10lb 2x15   Cybex Knee Flexion 25lb 2x10   Leg Press 20lb 2x15     Lumbar Exercises: Seated   Sit to Stand 10 reps  chest press with blue ball x2   Other Seated Lumbar Exercises Rows & Lats 35lb 2x15; back ext black band 2x10     Lumbar Exercises: Supine   Other Supine Lumbar Exercises seated trunk rotations blue ball x20                  PT Short Term Goals - 09/21/15 1516      PT SHORT TERM GOAL #1   Title independent with initial HEP   Status Partially Met           PT Long Term Goals - 10/26/15 1447      PT LONG TERM GOAL #1   Title understand proper posture and body mechanics   Status Achieved     PT LONG TERM GOAL #2    Title decrease pain 25%   Status Achieved     PT LONG TERM GOAL #3   Title increase lumbar ROM 25%   Status Achieved     PT LONG TERM GOAL #4   Title report being able to stand to cook a meal x 10 minutes without pain >6/10   Status Achieved               Plan - 10/26/15 1512    Clinical Impression Statement Pt enter clinic reporting decrease back pain. Pt has progressed completing all goals. Pt does seem to walk with an antalgic gait the first few step once standing but he corrects it after a few steps. Pt denied modality post treatment.   Rehab Potential Fair   PT Frequency 2x / week   PT Duration 8 weeks   PT Treatment/Interventions ADLs/Self Care Home Management;Moist Heat;Traction;Ultrasound;Electrical Stimulation;Cryotherapy;Patient/family education;Therapeutic exercise;Therapeutic activities;Manual techniques   PT Next Visit Plan assure compliance with independent gym program, D/C      Patient will benefit from skilled therapeutic intervention in order to improve the following deficits and impairments:  Abnormal gait, Decreased activity tolerance, Decreased mobility, Decreased range of motion, Decreased strength, Increased muscle spasms, Impaired flexibility, Postural dysfunction, Improper body mechanics, Pain  Visit Diagnosis: Chronic midline low back pain without sciatica  Muscle spasm of back  Acute midline low back pain without sciatica     Problem List Patient Active Problem List   Diagnosis Date Noted  . Carpal tunnel syndrome 12/26/2014  . Acute bronchitis 12/08/2014  . Hypersomnolence 12/08/2014  . Headache disorder 11/14/2014  . Paresthesia of both hands 11/14/2014  . PCP NOTES >>>>>>>>>>>>>>>>>> 11/14/2014  . Rectal bleeding 11/08/2014  . Diarrhea 11/08/2014  . Renal cyst 11/08/2014  . Liver cyst 11/08/2014  . Abnormal CT of the abdomen 11/08/2014  . Annual physical exam 01/20/2014  . Chronic rheumatic arthritis ([place]) 01/20/2014  . Skin  infection 12/29/2013  . Skin lesion 07/19/2013  . Recurrent left inguinal hernia 11/02/2012  . Hemorrhoids 05/29/2012  . Left inguinal hernia 03/12/2011  . Diabetes type 2, controlled ([place]) 08/20/2010  . Hyperlipidemia 09/20/2009  . Elevated PSA--BPH-- Dr Risa Grill 07/21/2009  . Neck pain, radiculopathy, s/p surgery, on gabapentin  12/12/2008  . OTHER SPECIFIED DISORDER OF THE ESOPHAGUS 08/24/2008  . GERD 03/30/2007  . DJD (degenerative joint disease) 07/31/2006    Scot Jun , PTA  10/26/2015, 3:19 PM  [address] [place], [place], [address] Phone: [phone]   Fax:  [phone]  Name: DARTANION TEO MRN: [id number] Date of Birth: 04-03-1945

## 2015-10-31 ENCOUNTER — Ambulatory Visit: Payer: BLUE CROSS/BLUE SHIELD | Admitting: Physical Therapy

## 2015-11-02 ENCOUNTER — Ambulatory Visit (INDEPENDENT_AMBULATORY_CARE_PROVIDER_SITE_OTHER): Payer: BLUE CROSS/BLUE SHIELD | Admitting: Adult Health

## 2015-11-02 ENCOUNTER — Encounter: Payer: Self-pay | Admitting: Adult Health

## 2015-11-02 ENCOUNTER — Ambulatory Visit: Payer: BLUE CROSS/BLUE SHIELD | Admitting: Adult Health

## 2015-11-02 VITALS — BP 108/67 | HR 88 | Resp 14 | Ht 64.0 in | Wt 130.0 lb

## 2015-11-02 DIAGNOSIS — G43009 Migraine without aura, not intractable, without status migrainosus: Secondary | ICD-10-CM | POA: Diagnosis not present

## 2015-11-02 MED ORDER — RIZATRIPTAN BENZOATE 10 MG PO TBDP: ORAL_TABLET | ORAL | 3 refills | Status: DC

## 2015-11-02 NOTE — Progress Notes (Signed)
I have read the note, and I agree with the clinical assessment and plan.  Christian Roberts   

## 2015-11-02 NOTE — Patient Instructions (Signed)
Continue topamax and maxalt If headaches persist then try nortriptyline  Nortriptyline capsules What is this medicine? NORTRIPTYLINE (nor TRIP ti leen) is used to treat depression. This medicine may be used for other purposes; ask your health care provider or pharmacist if you have questions. What should I tell my health care provider before I take this medicine? They need to know if you have any of these conditions: -an alcohol problem -bipolar disorder or schizophrenia -difficulty passing urine, prostate trouble -glaucoma -heart disease or recent heart attack -liver disease -over active thyroid -seizures -thoughts or plans of suicide or a previous suicide attempt or family history of suicide attempt -an unusual or allergic reaction to nortriptyline, other medicines, foods, dyes, or preservatives -pregnant or trying to get pregnant -breast-feeding How should I use this medicine? Take this medicine by mouth with a glass of water. Follow the directions on the prescription label. Take your doses at regular intervals. Do not take it more often than directed. Do not stop taking this medicine suddenly except upon the advice of your doctor. Stopping this medicine too quickly may cause serious side effects or your condition may worsen. A special MedGuide will be given to you by the pharmacist with each prescription and refill. Be sure to read this information carefully each time. Talk to your pediatrician regarding the use of this medicine in children. Special care may be needed. Overdosage: If you think you have taken too much of this medicine contact a poison control center or emergency room at once. NOTE: This medicine is only for you. Do not share this medicine with others. What if I miss a dose? If you miss a dose, take it as soon as you can. If it is almost time for your next dose, take only that dose. Do not take double or extra doses. What may interact with this medicine? Do not take  this medicine with any of the following medications: -arsenic trioxide -certain medicines medicines for irregular heart beat -cisapride -halofantrine -linezolid -MAOIs like Carbex, Eldepryl, Marplan, Nardil, and Parnate -methylene blue (injected into a vein) -other medicines for mental depression -phenothiazines like perphenazine, thioridazine and chlorpromazine -pimozide -probucol -procarbazine -sparfloxacin -St. John's Wort -ziprasidone This medicine may also interact with any of the following medications: -atropine and related drugs like hyoscyamine, scopolamine, tolterodine and others -barbiturate medicines for inducing sleep or treating seizures, such as phenobarbital -cimetidine -medicines for diabetes -medicines for seizures like carbamazepine or phenytoin -reserpine -thyroid medicine This list may not describe all possible interactions. Give your health care provider a list of all the medicines, herbs, non-prescription drugs, or dietary supplements you use. Also tell them if you smoke, drink alcohol, or use illegal drugs. Some items may interact with your medicine. What should I watch for while using this medicine? Tell your doctor if your symptoms do not get better or if they get worse. Visit your doctor or health care professional for regular checks on your progress. Because it may take several weeks to see the full effects of this medicine, it is important to continue your treatment as prescribed by your doctor. Patients and their families should watch out for new or worsening thoughts of suicide or depression. Also watch out for sudden changes in feelings such as feeling anxious, agitated, panicky, irritable, hostile, aggressive, impulsive, severely restless, overly excited and hyperactive, or not being able to sleep. If this happens, especially at the beginning of treatment or after a change in dose, call your health care professional. You may get  drowsy or dizzy. Do not  drive, use machinery, or do anything that needs mental alertness until you know how this medicine affects you. Do not stand or sit up quickly, especially if you are an older patient. This reduces the risk of dizzy or fainting spells. Alcohol may interfere with the effect of this medicine. Avoid alcoholic drinks. Do not treat yourself for coughs, colds, or allergies without asking your doctor or health care professional for advice. Some ingredients can increase possible side effects. Your mouth may get dry. Chewing sugarless gum or sucking hard candy, and drinking plenty of water may help. Contact your doctor if the problem does not go away or is severe. This medicine may cause dry eyes and blurred vision. If you wear contact lenses you may feel some discomfort. Lubricating drops may help. See your eye doctor if the problem does not go away or is severe. This medicine can cause constipation. Try to have a bowel movement at least every 2 to 3 days. If you do not have a bowel movement for 3 days, call your doctor or health care professional. This medicine can make you more sensitive to the sun. Keep out of the sun. If you cannot avoid being in the sun, wear protective clothing and use sunscreen. Do not use sun lamps or tanning beds/booths. What side effects may I notice from receiving this medicine? Side effects that you should report to your doctor or health care professional as soon as possible: -allergic reactions like skin rash, itching or hives, swelling of the face, lips, or tongue -abnormal production of milk in females -breast enlargement in both males and females -breathing problems -confusion, hallucinations -fever with increased sweating -irregular or fast, pounding heartbeat -muscle stiffness, or spasms -pain or difficulty passing urine, loss of bladder control -seizures -suicidal thoughts or other mood changes -swelling of the testicles -tingling, pain, or numbness in the feet or  hands -yellowing of the eyes or skin Side effects that usually do not require medical attention (report to your doctor or health care professional if they continue or are bothersome): -change in sex drive or performance -diarrhea -nausea, vomiting -weight gain or loss This list may not describe all possible side effects. Call your doctor for medical advice about side effects. You may report side effects to FDA at 1-800-FDA-1088. Where should I keep my medicine? Keep out of the reach of children. Store at room temperature between 15 and 30 degrees C (59 and 86 degrees F). Keep container tightly closed. Throw away any unused medicine after the expiration date. NOTE: This sheet is a summary. It may not cover all possible information. If you have questions about this medicine, talk to your doctor, pharmacist, or health care provider.    2016, [employer]vier/[employer] Standard. (2011-05-13 13:57:12)

## 2015-11-02 NOTE — Progress Notes (Signed)
PATIENT: Christian Roberts DOB: 03/28/1945  REASON FOR VISIT: follow up- left-sided headaches HISTORY FROM: patient  HISTORY OF PRESENT ILLNESS: Christian Roberts is a 70 year old male with a history of left-sided headaches. He returns today for follow-up. He reports that he has about 2 weeks out of a month that he has a headache off and on. He states that when the headache starts he does take Maxalt and it resolves fairly quickly however the next day be headache returns. He states that he only takes Maxalt 2 days in a row and then will not take any additional tablets as he does not want a rebound headache. He states the headache is always located on the left side of the head. He has mild photophobia but denies phonophobia. He does have nausea but no vomiting. He was placed on Cymbalta by his primary care however he reports he is not able to tolerate this. He is on Topamax 200 mg daily. He returns today for an evaluation.  HISTORY 08/29/15: Christian Roberts is a 70 year old male with a history of left-sided headaches. He returns today for follow-up. He reports that in June he was given a prednisone Dosepak. He states that his headaches resolved for almost one month. He states in the last 3 weeks they started to come back and now are essentially daily again. He states that his headaches usually always occur around 4 PM. It involves the entire left side of the head. Typically if he is able to take Maxalt it well resolve within 20 minutes. He also he uses an ice pack and Orajel on the gums. He does have photophobia and phonophobia. He does have nausea but no vomiting. Reports that he was started on Cymbalta this week for his back pain. He has decreased his caffeine intake. Continues to take Topamax 200 mg daily. The patient is not opposed to starting a new preventative medication however he is leaving for [place] on Thursday. He returns today for an evaluation.  REVIEW OF SYSTEMS: Out of a complete 14 system review of  symptoms, the patient complains only of the following symptoms, and all other reviewed systems are negative.  Headache, numbness, passing out, frequency of urination, urgency, joint pain, joint swelling, back pain, muscle cramps, walking difficulty, neck pain, neck stiffness, moles, itching, daytime sleepiness, sleep talking, diarrhea, excessive thirst, eye discharge, eye itching, light sensitivity, blurred vision, cough, drooling, ringing in ears, fatigue  ALLERGIES: Allergies  Allergen Reactions  . Aspirin Anaphylaxis  . Codeine Phosphate Anaphylaxis  . Ibuprofen Anaphylaxis  . Levofloxacin Itching and Anaphylaxis  . Menthol Swelling  . Monosodium Glutamate Diarrhea and Nausea And Vomiting    Other reaction(s): Cramps (ALLERGY/intolerance)  . Morphine Sulfate Anaphylaxis  . Naproxen Sodium Anaphylaxis  . Nsaids Anaphylaxis  . Other Itching    **BAND-AID**  . Sulfamethoxazole Anaphylaxis  . Cymbalta [Duloxetine Hcl] Other (See Comments)    Dizziness, syncope, and sleepiness     HOME MEDICATIONS: Outpatient Medications Prior to Visit  Medication Sig Dispense Refill  . acetaminophen (TYLENOL) 500 MG tablet Take 500-1,000 mg by mouth every 4 (four) hours as needed. For pain    . albuterol (VENTOLIN HFA) 108 (90 BASE) MCG/ACT inhaler Inhale 2 puffs into the lungs every 6 (six) hours as needed for wheezing or shortness of breath. 1 Inhaler 1  . Blood Glucose Monitoring Suppl (ONE TOUCH ULTRA SYSTEM KIT) W/DEVICE KIT Use device to check blood sugar. 1 each 0  . diphenhydrAMINE (BENADRYL) 25 MG tablet  Take 25 mg by mouth every 6 (six) hours as needed.    Marland Kitchen DOCUSATE SODIUM PO Take by mouth. Reported on 01/26/2015    . esomeprazole (NEXIUM) 10 MG packet Take 10 mg by mouth daily before breakfast.    . etanercept (ENBREL) 50 MG/ML injection Inject 50 mg into the skin once a week.    . fish oil-omega-3 fatty acids 1000 MG capsule Take 1 g by mouth 2 (two) times daily.     . fluticasone  (FLONASE) 50 MCG/ACT nasal spray Place 1 spray into both nostrils daily.    . folic acid (FOLVITE) 1 MG tablet Take 1 mg by mouth daily.    Marland Kitchen gabapentin (NEURONTIN) 300 MG capsule Take 1 capsule (300 mg total) by mouth 4 (four) times daily. 450 capsule 1  . glucose blood (ACCU-CHEK SMARTVIEW) test strip Reported on 05/24/2015    . glucose blood (FREESTYLE LITE) test strip Check blood sugar no more than twice daily. 100 each 12  . hydrocortisone 2.5 % cream Apply 1 application topically 2 (two) times daily. Reported on 01/26/2015    . ketoconazole (NIZORAL) 2 % cream Apply 1 application topically daily. 30 g 1  . Lancets (FREESTYLE) lancets Check blood sugars no more than twice daily. 100 each 12  . lidocaine (LIDODERM) 5 % Place 2 patches onto the skin daily. 90 patch 3  . Lifitegrast (XIIDRA) 5 % SOLN Apply to eye.    . Menthol, Topical Analgesic, (BIOFREEZE ROLL-ON EX) Apply topically.    . metFORMIN (GLUCOPHAGE) 1000 MG tablet Take 1 tablet (1,000 mg total) by mouth 2 (two) times daily with a meal. 180 tablet 1  . Methotrexate, PF, 20 MG/0.4ML SOAJ Inject into the skin. Once a week    . MYRBETRIQ 50 MG TB24 tablet     . predniSONE (DELTASONE) 5 MG tablet Begin taking 6 tablets daily, taper by one tablet daily until off the medication. 21 tablet 0  . PROCTOSOL HC 2.5 % rectal cream Reported on 01/26/2015    . Pseudoephedrine HCl, Deter, 30 MG TABA Take 1 tablet by mouth as needed. Reported on 01/26/2015    . Psyllium (METAMUCIL PO) Take by mouth.    . rizatriptan (MAXALT-MLT) 10 MG disintegrating tablet Take 1 tablet PO at onset of migraine- may repeat in two hours if needed. 9 tablet 3  . Simethicone (GAS-X PO) Take by mouth. Reported on 01/26/2015    . simvastatin (ZOCOR) 20 MG tablet Take 1 tablet (20 mg total) by mouth at bedtime. 90 tablet 1  . Tamsulosin HCl (FLOMAX) 0.4 MG CAPS Take 0.4 mg by mouth daily after supper.     . topiramate (TOPAMAX) 100 MG tablet Take 1 tablet (100 mg total) by  mouth 2 (two) times daily. 60 tablet 3   No facility-administered medications prior to visit.     PAST MEDICAL HISTORY: Past Medical History:  Diagnosis Date  . Allergic rhinitis   . Asthma    "I outgrew it"  . Carpal tunnel syndrome   . Chronic rheumatic arthritis ([place]) 01/20/2014   Per Dr. Amil Amen   . Complete tear of rotator cuff 2016   Right, MRI in 2016, complete rupture of long head of the biceps as well as a tear of the subscapularis and supraspinatus tendons, Dr. Mardelle Matte  . Complication of anesthesia   . Constipation   . Diabetes mellitus    2012  . Elevated PSA   . GERD (gastroesophageal reflux disease)   .  Headache(784.0)    headaches-related to sinuses & uses Tylenol   . Hemorrhoids   . Hernia   . Hyperlipidemia   . Nocturia   . Numbness of fingers   . Osteoarthritis    ankle- left, hands & neck & hip  . PONV (postoperative nausea and vomiting)    WITH ETHER  . Skin cancer, basal cell    sees derm    PAST SURGICAL HISTORY: Past Surgical History:  Procedure Laterality Date  . ANKLE FUSION Left 03-19-13   Dr Mardelle Matte  . ANKLE SURGERY Left   . APPENDECTOMY     1964  . BACK SURGERY     cerv. fusion C1- T1, 2 separate surgeries , T1-T3 2014  . CARPAL TUNNEL RELEASE Right   . CARPAL TUNNEL RELEASE Left 07-14-2014   [facility]  . CYSTOSCOPY     multiple times for infections  . INGUINAL HERNIA REPAIR  03/27/2011   Procedure: HERNIA REPAIR INGUINAL ADULT;  Surgeon: Harl Bowie, MD;  Location: [facility];  Service: General;  Laterality: Left;  . INGUINAL HERNIA REPAIR Bilateral 12/09/2012   Procedure: LAPAROSCOPIC BILATERAL INGUINAL HERNIA REPAIR WITH MESH;  Surgeon: Harl Bowie, MD;  Location: [facility];  Service: General;  Laterality: Bilateral;  . INSERTION OF MESH Bilateral 12/09/2012   Procedure: INSERTION OF MESH;  Surgeon: Harl Bowie, MD;  Location: [facility];  Service: General;  Laterality: Bilateral;  . left ankle mass removed  1972   3 surgeries on L  ankle- 73, 2008, 2009  . NECK SURGERY  03/11,    C3-C7  . RECTAL SURGERY  at birth   "[place]" SURGICAL CORRECTION  . ROTATOR CUFF REPAIR W/ DISTAL CLAVICLE EXCISION Right 08/25/2014   And acromioplasty, Dr. Mardelle Matte  . SHOULDER SURGERY  09/17/12  . TONSILLECTOMY    . VARICOCELECTOMY      FAMILY HISTORY: Family History  Problem Relation Age of Onset  . Stroke Mother   . Anesthesia problems Mother   . Cerebral aneurysm Father   . Skin cancer Father     melanoma  . Stroke Father   . Diabetes Sister     hypoglycemia  . Breast cancer Sister     x 2  . Lung cancer Sister   . Atrial fibrillation Sister   . Lung cancer Sister   . Emphysema Sister   . Atrial fibrillation Sister   . Colon cancer Neg Hx   . Prostate cancer Neg Hx     SOCIAL HISTORY: Social History   Social History  . Marital status: Married    Spouse name: Arbie Cookey  . Number of children: 3  . Years of education: BA+   Occupational History  . 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 Smoking status: Current Every Day Smoker    Packs/day: 5.00    Types: Cigarettes    Last attempt to quit: 04/10/2012  . Smokeless tobacco: Never Used     Comment: quit on-off since 2014   . Alcohol use No  . Drug use: No  . Sexual activity: Not Currently   Other Topics Concern  . Not on file   Social History Narrative   Married , lives w/ wife   Right-handed   Rare caffeine intake         PHYSICAL EXAM  Vitals:   11/02/15 0721  BP: 108/67  Pulse: 88  Resp: 14  Weight: 130 lb (59 kg)  Height:  5' 4" (1.626 m)   Body mass index is 22.31 kg/m.  Generalized: Well developed, in no acute distress   Neurological examination  Mentation: Alert oriented to time, place, history taking. Follows all commands speech and language fluent Cranial nerve II-XII: Pupils were equal round reactive to light. Extraocular movements were full, visual field were full on confrontational test. Facial  sensation and strength were normal. Uvula tongue midline. Head turning and shoulder shrug  were normal and symmetric. Motor: The motor testing reveals 5 over 5 strength of all 4 extremities. Good symmetric motor tone is noted throughout.  Sensory: Sensory testing is intact to soft touch on all 4 extremities. No evidence of extinction is noted.  Coordination: Cerebellar testing reveals good finger-nose-finger and heel-to-shin bilaterally.  Gait and station: Gait is normal. Tandem gait is normal. Romberg is negative. No drift is seen.  Reflexes: Deep tendon reflexes are symmetric and normal bilaterally.   DIAGNOSTIC DATA (LABS, IMAGING, TESTING) - I reviewed patient records, labs, notes, testing and imaging myself where available.  Lab Results  Component Value Date   WBC 7.2 01/26/2015   HGB 12.4 (L) 01/26/2015   HCT 38.3 (L) 01/26/2015   MCV 88.3 01/26/2015   PLT 347.0 01/26/2015      Component Value Date/Time   NA 142 01/26/2015 0841   NA 146 10/20/2014   K 4.2 01/26/2015 0841   CL 107 01/26/2015 0841   CO2 27 01/26/2015 0841   GLUCOSE 133 (H) 01/26/2015 0841   BUN 19 01/26/2015 0841   BUN 13 10/20/2014   CREATININE 0.98 01/26/2015 0841   CALCIUM 9.7 01/26/2015 0841   PROT 6.9 03/08/2013 0842   ALBUMIN 3.9 03/08/2013 0842   AST 16 10/20/2014   ALT 16 10/20/2014   ALKPHOS 72 10/20/2014   BILITOT 0.7 03/08/2013 0842   GFRNONAA 68 (L) 01/06/2014 1045   GFRAA 79 (L) 01/06/2014 1045   Lab Results  Component Value Date   CHOL 155 01/26/2015   HDL 57.10 01/26/2015   LDLCALC 82 01/26/2015   LDLDIRECT 145.3 12/23/2011   TRIG 79.0 01/26/2015   CHOLHDL 3 01/26/2015   Lab Results  Component Value Date   HGBA1C 6.8 (H) 05/24/2015      ASSESSMENT AND PLAN 70 y.o. year old male  has a past medical history of Allergic rhinitis; Asthma; Carpal tunnel syndrome; Chronic rheumatic arthritis ([place]) (01/20/2014); Complete tear of rotator cuff (2016); Complication of anesthesia;  Constipation; Diabetes mellitus; Elevated PSA; GERD (gastroesophageal reflux disease); Headache(784.0); Hemorrhoids; Hernia; Hyperlipidemia; Nocturia; Numbness of fingers; Osteoarthritis; PONV (postoperative nausea and vomiting); and Skin cancer, basal cell. here with:  1. Migraine headache  For now the patient would like to continue Maxalt and Topamax. We did discuss starting nortriptyline. He states that if his headaches continue he will call and let us know and at that time we will begin nortriptyline 10 mg at bedtime. Patient will follow-up in 3 months or sooner if needed.     Ward Givens, MSN, NP-C 11/02/2015, 7:42 AM [facility] [address], [place], [place] [address] [phone]

## 2015-11-03 ENCOUNTER — Encounter: Payer: BLUE CROSS/BLUE SHIELD | Admitting: Physical Therapy

## 2015-11-06 ENCOUNTER — Other Ambulatory Visit: Payer: Self-pay | Admitting: Neurology

## 2015-11-06 ENCOUNTER — Telehealth: Payer: Self-pay | Admitting: Internal Medicine

## 2015-11-06 ENCOUNTER — Telehealth: Payer: Self-pay | Admitting: Adult Health

## 2015-11-06 MED ORDER — TOPIRAMATE 100 MG PO TABS: 100.0000 mg | ORAL_TABLET | Freq: Two times a day (BID) | ORAL | 1 refills | Status: DC

## 2015-11-06 MED ORDER — SIMVASTATIN 20 MG PO TABS: 20.0000 mg | ORAL_TABLET | Freq: Every day | ORAL | 1 refills | Status: DC

## 2015-11-06 NOTE — Telephone Encounter (Signed)
Rx sent 

## 2015-11-06 NOTE — Telephone Encounter (Signed)
Patient requesting refill of topiramate (TOPAMAX) 100 MG tablet Pharmacy: [facility]  Pt is out of medication

## 2015-11-06 NOTE — Telephone Encounter (Signed)
[facility]

## 2015-11-06 NOTE — Telephone Encounter (Signed)
Self. Refill request for simvastatin - 90 day supply.   Pharmacy: [facility] [address].

## 2015-11-14 ENCOUNTER — Ambulatory Visit: Payer: BLUE CROSS/BLUE SHIELD | Admitting: Physical Therapy

## 2015-11-15 ENCOUNTER — Encounter: Payer: BLUE CROSS/BLUE SHIELD | Admitting: Physical Medicine & Rehabilitation

## 2015-11-16 ENCOUNTER — Ambulatory Visit: Payer: BLUE CROSS/BLUE SHIELD | Admitting: Physical Medicine & Rehabilitation

## 2015-11-23 ENCOUNTER — Encounter: Payer: Self-pay | Admitting: Internal Medicine

## 2015-11-23 ENCOUNTER — Ambulatory Visit (INDEPENDENT_AMBULATORY_CARE_PROVIDER_SITE_OTHER): Payer: BLUE CROSS/BLUE SHIELD | Admitting: Internal Medicine

## 2015-11-23 VITALS — BP 116/66 | HR 78 | Temp 97.6°F | Resp 12 | Ht 64.0 in | Wt 125.5 lb

## 2015-11-23 DIAGNOSIS — E785 Hyperlipidemia, unspecified: Secondary | ICD-10-CM | POA: Diagnosis not present

## 2015-11-23 DIAGNOSIS — R634 Abnormal weight loss: Secondary | ICD-10-CM

## 2015-11-23 DIAGNOSIS — E119 Type 2 diabetes mellitus without complications: Secondary | ICD-10-CM

## 2015-11-23 DIAGNOSIS — G629 Polyneuropathy, unspecified: Secondary | ICD-10-CM

## 2015-11-23 LAB — LIPID PANEL
CHOLESTEROL: 137 mg/dL (ref 0–200)
HDL: 45.3 mg/dL (ref >39.00)
LDL CALC: 76 mg/dL (ref 0–99)
NonHDL: 91.85
Total CHOL/HDL Ratio: 3
Triglycerides: 77 mg/dL (ref 0.0–149.0)
VLDL: 15.4 mg/dL (ref 0.0–40.0)

## 2015-11-23 LAB — TSH: TSH: 1.07 u[IU]/mL (ref 0.35–4.50)

## 2015-11-23 LAB — HEMOGLOBIN A1C: HEMOGLOBIN A1C: 6.9 % — AB (ref 4.6–6.5)

## 2015-11-23 NOTE — Patient Instructions (Addendum)
GO TO THE LAB : Get the blood work     GO TO THE FRONT DESK Schedule your next appointment for a  Physical, no fasting, in 3-4 months

## 2015-11-23 NOTE — Progress Notes (Signed)
Pre visit review using our clinic review tool, if applicable. No additional management support is needed unless otherwise documented below in the visit note. 

## 2015-11-23 NOTE — Progress Notes (Signed)
Subjective:    Patient ID: Christian Roberts, male    DOB: 01-13-1945, 70 y.o.   MRN: [id number]  DOS:  11/23/2015 Type of visit - description : rov Interval history: Pain continue to be his main concern. Also has neuropathy, burning sensation in the fingers, this flareup is more noticeable at the right index. Has noticed some weight loss, diet has not changed, he has been a little more active doing physical therapy. DM: CBGs have been high lately today 180s. He is actually not taking any prednisone.  Wt Readings from Last 3 Encounters:  11/23/15 125 lb 8 oz (56.9 kg)  11/02/15 130 lb (59 kg)  10/18/15 129 lb (58.5 kg)     Review of Systems  Denies fever chills No edema or palpitations No nausea, vomiting. No blood in the stools but occasionally has loose stools. Has chronic polyuria and polydipsia. Denies depression.  Past Medical History:  Diagnosis Date  . Allergic rhinitis   . Asthma    "I outgrew it"  . Carpal tunnel syndrome   . Chronic rheumatic arthritis ([place]) 01/20/2014   Per Dr. Amil Amen   . Complete tear of rotator cuff 2016   Right, MRI in 2016, complete rupture of long head of the biceps as well as a tear of the subscapularis and supraspinatus tendons, Dr. Mardelle Matte  . Complication of anesthesia   . Constipation   . Diabetes mellitus    2012  . Elevated PSA   . GERD (gastroesophageal reflux disease)   . Headache(784.0)    headaches-related to sinuses & uses Tylenol   . Hemorrhoids   . Hernia   . Hyperlipidemia   . Nocturia   . Numbness of fingers   . Osteoarthritis    ankle- left, hands & neck & hip  . PONV (postoperative nausea and vomiting)    WITH ETHER  . Skin cancer, basal cell    sees derm    Past Surgical History:  Procedure Laterality Date  . ANKLE FUSION Left 03-19-13   Dr Mardelle Matte  . ANKLE SURGERY Left   . APPENDECTOMY     1964  . BACK SURGERY     cerv. fusion C1- T1, 2 separate surgeries , T1-T3 2014  . CARPAL TUNNEL RELEASE Right     . CARPAL TUNNEL RELEASE Left 07-14-2014   [facility]  . CYSTOSCOPY     multiple times for infections  . INGUINAL HERNIA REPAIR  03/27/2011   Procedure: HERNIA REPAIR INGUINAL ADULT;  Surgeon: Harl Bowie, MD;  Location: [facility];  Service: General;  Laterality: Left;  . INGUINAL HERNIA REPAIR Bilateral 12/09/2012   Procedure: LAPAROSCOPIC BILATERAL INGUINAL HERNIA REPAIR WITH MESH;  Surgeon: Harl Bowie, MD;  [facility];  Service: General;  Laterality: Bilateral;  . INSERTION OF MESH Bilateral 12/09/2012   Procedure: INSERTION OF MESH;  Surgeon: Harl Bowie, MD;  [facility];  Service: General;  Laterality: Bilateral;  . left ankle mass removed  1972   3 surgeries on L ankle- 73, 2008, 2009  . NECK SURGERY  03/11,    C3-C7  . RECTAL SURGERY  at birth   "[place]" SURGICAL CORRECTION  . ROTATOR CUFF REPAIR W/ DISTAL CLAVICLE EXCISION Right 08/25/2014   And acromioplasty, Dr. Mardelle Matte  . SHOULDER SURGERY  09/17/12  . TONSILLECTOMY    . VARICOCELECTOMY      Social History   Social History  . Marital status: Married    Spouse name:  Arbie Cookey  . Number of children: 3  . Years of education: BA+   Occupational History  . 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 Smoking status: Current Every Day Smoker    Packs/day: 5.00    Types: Cigarettes    Last attempt to quit: 04/10/2012  . Smokeless tobacco: Never Used     Comment: quit on-off since 2014   . Alcohol use No  . Drug use: No  . Sexual activity: Not Currently   Other Topics Concern  . Not on file   Social History Narrative   Married , lives w/ wife   Right-handed   Rare caffeine intake           Medication List       Accurate as of 11/23/15 11:59 PM. Always use your most recent med list.          acetaminophen 500 MG tablet Commonly known as:  TYLENOL Take 500-1,000 mg by mouth every 4 (four) hours as needed. For pain   albuterol 108 (90 Base) MCG/ACT  inhaler Commonly known as:  VENTOLIN HFA Inhale 2 puffs into the lungs every 6 (six) hours as needed for wheezing or shortness of breath.   BENADRYL 25 MG tablet Generic drug:  diphenhydrAMINE Take 25 mg by mouth every 6 (six) hours as needed.   BIOFREEZE ROLL-ON EX Apply topically.   DOCUSATE SODIUM PO Take by mouth. Reported on 01/26/2015   ENBREL 50 MG/ML injection Generic drug:  etanercept Inject 50 mg into the skin once a week.   esomeprazole 10 MG packet Commonly known as:  NEXIUM Take 10 mg by mouth daily before breakfast.   fish oil-omega-3 fatty acids 1000 MG capsule Take 1 g by mouth 2 (two) times daily.   fluticasone 50 MCG/ACT nasal spray Commonly known as:  FLONASE Place 1 spray into both nostrils daily.   folic acid 1 MG tablet Commonly known as:  FOLVITE Take 1 mg by mouth daily.   freestyle lancets Check blood sugars no more than twice daily.   gabapentin 300 MG capsule Commonly known as:  NEURONTIN Take 1 capsule (300 mg total) by mouth 4 (four) times daily.   GAS-X PO Take by mouth. Reported on 01/26/2015   ACCU-CHEK SMARTVIEW test strip Generic drug:  glucose blood Reported on 05/24/2015   glucose blood test strip Commonly known as:  FREESTYLE LITE Check blood sugar no more than twice daily.   hydrocortisone 2.5 % cream Apply 1 application topically 2 (two) times daily. Reported on 01/26/2015   ketoconazole 2 % cream Commonly known as:  NIZORAL Apply 1 application topically daily.   lidocaine 5 % Commonly known as:  LIDODERM Place 2 patches onto the skin daily.   METAMUCIL PO Take by mouth.   metFORMIN 1000 MG tablet Commonly known as:  GLUCOPHAGE Take 1 tablet (1,000 mg total) by mouth 2 (two) times daily with a meal.   Methotrexate (PF) 20 MG/0.4ML Soaj Inject into the skin. Once a week   MYRBETRIQ 50 MG Tb24 tablet Generic drug:  mirabegron ER   ONE TOUCH ULTRA SYSTEM KIT w/Device Kit Use device to check blood sugar.    PROCTOSOL HC 2.5 % rectal cream Generic drug:  hydrocortisone Reported on 01/26/2015   Pseudoephedrine HCl (Deter) 30 MG Taba Take 1 tablet by mouth as needed. Reported on 01/26/2015   rizatriptan 10 MG disintegrating tablet Commonly known as:  MAXALT-MLT Take 1 tablet PO at onset of  migraine- may repeat in two hours if needed.   simvastatin 20 MG tablet Commonly known as:  ZOCOR Take 1 tablet (20 mg total) by mouth at bedtime.   tamsulosin 0.4 MG Caps capsule Commonly known as:  FLOMAX Take 0.4 mg by mouth daily after supper.   topiramate 100 MG tablet Commonly known as:  TOPAMAX Take 1 tablet (100 mg total) by mouth 2 (two) times daily.   XIIDRA 5 % Soln Generic drug:  Lifitegrast Apply to eye.          Objective:   Physical Exam BP 116/66 (BP Location: Left Arm, Patient Position: Sitting, Cuff Size: Small)   Pulse 78   Temp 97.6 F (36.4 C) (Oral)   Resp 12   Ht 5' 4"  (1.626 m)   Wt 125 lb 8 oz (56.9 kg)   SpO2 92%   BMI 21.54 kg/m  General:   Well developed, well nourished . NAD.  HEENT:  Normocephalic . Face symmetric, atraumatic Lungs:  CTA B Normal respiratory effort, no intercostal retractions, no accessory muscle use. Heart: RRR,  no murmur.  no pretibial edema bilaterally  Abdomen:  Not distended, soft, non-tender. No rebound or rigidity.  Skin: Not pale. Not jaundice Neurologic:  alert & oriented X3.  Speech normal, gait appropriate for age and unassisted Psych--  Cognition and judgment appear intact.  Cooperative with normal attention span and concentration.  Behavior appropriate. No anxious or depressed appearing.    Assessment & Plan:   Assessment  DM 2012 Hyperlipidemia zocor  GERD Abnormal gallbladder per CT-- Saw surgery 12-16: No surgery rx MSK: ---Pain mngmt: reports a severe allergic reaction to  NSAIDs-codeine-MSO4 ; can tolerate oxycodone but the pain relief is just mild ; intolerant to Cymbalta 10-2015 ---Rheumatoid  arthritis -- on enbrel - MTX (Dr Elpidio Galea) ---DJD multiple sites, cervical spondylosis -- gabapentin ---CTS ---neuropathy on-off, hands R>L Headaches --topamax GU: sees urology --Elevated PSA --BPH Skin cancer, BCC  PLAN: DM: Continue metformin, check A1c. Reports CBGs has been higher than baseline in the 180s in the morning (he is not taking prednisone anymore). Further advise with results. High cholesterol: On simvastatin, check FLP. Weight loss: Unclear etiology, no depression. He does have occasional diarrhea. If his A1c come back very high that may account for weight loss; check a TSH. Neuropathy: On and off problem for years, current flareup is worse at the right index, increase gabapentin? Patient not interested. Recommend to use a lidocaine patch as needed. Had blood work recently rheumatology, will request results. I'm interested to see a CBC, BMP, AST, ALT. RTC 3-4 months, CPX, no fasting.

## 2015-11-24 NOTE — Assessment & Plan Note (Signed)
DM: Continue metformin, check A1c. Reports CBGs has been higher than baseline in the 180s in the morning (he is not taking prednisone anymore). Further advise with results. High cholesterol: On simvastatin, check FLP. Weight loss: Unclear etiology, no depression. He does have occasional diarrhea. If his A1c come back very high that may account for weight loss; check a TSH. Neuropathy: On and off problem for years, current flareup is worse at the right index, increase gabapentin? Patient not interested. Recommend to use a lidocaine patch as needed. Had blood work recently rheumatology, will request results. I'm interested to see a CBC, BMP, AST, ALT. RTC 3-4 months, CPX, no fasting.

## 2015-12-04 ENCOUNTER — Telehealth: Payer: Self-pay | Admitting: Adult Health

## 2015-12-04 MED ORDER — NORTRIPTYLINE HCL 10 MG PO CAPS: 10.0000 mg | ORAL_CAPSULE | Freq: Every day | ORAL | 3 refills | Status: DC

## 2015-12-04 NOTE — Telephone Encounter (Signed)
Called and spoke to pt. Reports that HAs have not improved. He continues to have at least 2 bad headaches per day even w/ taking topiramate BID. Says that he also had to use rescue med "entirely too much" during the month of November. Would like to begin nortriptyline 10 mg at bedtime as discussed w/ [name] NP at last OV. He will call back in a few days to report toleration of new med and update on symptoms.

## 2015-12-04 NOTE — Telephone Encounter (Signed)
Pt called to advise he is having too many HA's to take rizatriptan (MAXALT-MLT) 10 MG disintegrating tablet . He would like to try something else.

## 2015-12-14 NOTE — Telephone Encounter (Signed)
Patient states the Nortriptyline has not helped his headaches. He has gone to an eye doctor and he suggested headaches may be coming from his sinus' but not from his eyes. Please call and discuss.

## 2015-12-14 NOTE — Telephone Encounter (Signed)
I called the patient and LVM for him to call office 

## 2015-12-15 NOTE — Telephone Encounter (Signed)
Patient is returning [name]'s call.

## 2015-12-15 NOTE — Telephone Encounter (Signed)
I spoke to pt.  He relates that nortriptyline has not helped.  He wonders of sinus related due to pain behind eye in center/ top of head. Had eye exam and was normal.   He is having congestion/ pressure now.  Headaches started in march 2017.  No help with medications that we have prescribed.  Will see Dr. Larose Kells and see what he recommends.  Has not seen ENT if over 40 yrs.  I would let MM/NP know.

## 2015-12-18 NOTE — Telephone Encounter (Signed)
I called the patient. He states that his headache frequency is increasing. He states the headache always occurs behind the right eye and extends to the back of the head on the right side. He does have photophobia and phonophobia. He has nausea but no vomiting. He states that he can have 3 headaches a day. He states that the headache will start and then slowly subside and then may return later. He even can be woken up at night with a migraine. He denies snoring. He states that he takes Tylenol for the headache but with little benefit. He is convinced that this may be sinus related. I advised the patient that we could stop the nortriptyline and consider starting Depakote. I have reviewed Depakote with the patient. He is amenable to this plan however he would like to discuss this with his primary care first to be sure that his headaches are not sinus related. I am amenable to this plan. He will call back if he wants to start Depakote.

## 2015-12-19 ENCOUNTER — Encounter: Payer: Self-pay | Admitting: Internal Medicine

## 2015-12-19 ENCOUNTER — Ambulatory Visit (HOSPITAL_BASED_OUTPATIENT_CLINIC_OR_DEPARTMENT_OTHER)
Admission: RE | Admit: 2015-12-19 | Discharge: 2015-12-19 | Disposition: A | Discharge status: Home / Self Care | Payer: BLUE CROSS/BLUE SHIELD | Source: Ambulatory Visit | Attending: Internal Medicine | Admitting: Internal Medicine

## 2015-12-19 ENCOUNTER — Ambulatory Visit (INDEPENDENT_AMBULATORY_CARE_PROVIDER_SITE_OTHER): Payer: BLUE CROSS/BLUE SHIELD | Admitting: Internal Medicine

## 2015-12-19 VITALS — BP 134/72 | HR 102 | Temp 98.1°F | Resp 14 | Ht 64.0 in | Wt 127.4 lb

## 2015-12-19 DIAGNOSIS — R51 Headache: Secondary | ICD-10-CM | POA: Diagnosis not present

## 2015-12-19 DIAGNOSIS — R519 Headache, unspecified: Secondary | ICD-10-CM

## 2015-12-19 DIAGNOSIS — G8929 Other chronic pain: Secondary | ICD-10-CM

## 2015-12-19 MED ORDER — VERAPAMIL HCL ER 120 MG PO TBCR: 120.0000 mg | EXTENDED_RELEASE_TABLET | Freq: Every day | ORAL | 1 refills | Status: DC

## 2015-12-19 NOTE — Progress Notes (Signed)
Subjective:    Patient ID: Christian Roberts, male    DOB: Sep 17, 1945, 71 y.o.   MRN: [id number]  DOS:  12/19/2015 Type of visit - description : Acute Interval history: Here for eval of headaches. Chronic HAs were well-controlled up until May when they become very frequent and intense. Headaches are only on the left side of his head, on and off, mostly asymptomatic between episodes, associated with nausea, left eye tearing and sneezing. Denies head injuries. The headache sometimes involve the neck. Saw neurology 4 times, they increased Topamax, it is not preventing headaches, they are actually getting more frequent and intense  every 2 hours sometimes. Nortriptyline was tried: Did not help Was prescribed Maxalt which helped but he can only take a limited amount. Patient wonders if he has a sinus infection but denies fever chills, admits to some sore throat and occasionally blows mucus from the nose.   Review of Systems SEe above  Past Medical History:  Diagnosis Date  . Allergic rhinitis   . Asthma    "I outgrew it"  . Carpal tunnel syndrome   . Chronic rheumatic arthritis ([place]) 01/20/2014   Per Dr. Amil Amen   . Complete tear of rotator cuff 2016   Right, MRI in 2016, complete rupture of long head of the biceps as well as a tear of the subscapularis and supraspinatus tendons, Dr. Mardelle Matte  . Complication of anesthesia   . Constipation   . Diabetes mellitus    2012  . Elevated PSA   . GERD (gastroesophageal reflux disease)   . Headache(784.0)    headaches-related to sinuses & uses Tylenol   . Hemorrhoids   . Hernia   . Hyperlipidemia   . Nocturia   . Numbness of fingers   . Osteoarthritis    ankle- left, hands & neck & hip  . PONV (postoperative nausea and vomiting)    WITH ETHER  . Skin cancer, basal cell    sees derm    Past Surgical History:  Procedure Laterality Date  . ANKLE FUSION Left 03-19-13   Dr Mardelle Matte  . ANKLE SURGERY Left   . APPENDECTOMY     1964  .  BACK SURGERY     cerv. fusion C1- T1, 2 separate surgeries , T1-T3 2014  . CARPAL TUNNEL RELEASE Right   . CARPAL TUNNEL RELEASE Left 07-14-2014   [facility]  . CYSTOSCOPY     multiple times for infections  . INGUINAL HERNIA REPAIR  03/27/2011   Procedure: HERNIA REPAIR INGUINAL ADULT;  Surgeon: Harl Bowie, MD;  Location: [facility];  Service: General;  Laterality: Left;  . INGUINAL HERNIA REPAIR Bilateral 12/09/2012   Procedure: LAPAROSCOPIC BILATERAL INGUINAL HERNIA REPAIR WITH MESH;  Surgeon: Harl Bowie, MD;  Location: [facility];  Service: General;  Laterality: Bilateral;  . INSERTION OF MESH Bilateral 12/09/2012   Procedure: INSERTION OF MESH;  Surgeon: Harl Bowie, MD;  Location: [facility];  Service: General;  Laterality: Bilateral;  . left ankle mass removed  1972   3 surgeries on L ankle- 73, 2008, 2009  . NECK SURGERY  03/11,    C3-C7  . RECTAL SURGERY  at birth   "[place]" SURGICAL CORRECTION  . ROTATOR CUFF REPAIR W/ DISTAL CLAVICLE EXCISION Right 08/25/2014   And acromioplasty, Dr. Mardelle Matte  . SHOULDER SURGERY  09/17/12  . TONSILLECTOMY    . VARICOCELECTOMY      Social History   Social History  . Marital  status: Married    Spouse name: Arbie Cookey  . Number of children: 3  . Years of education: BA+   Occupational History  . 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 Smoking status: Current Every Day Smoker    Packs/day: 5.00    Types: Cigarettes    Last attempt to quit: 04/10/2012  . Smokeless tobacco: Never Used     Comment: quit on-off since 2014   . Alcohol use No  . Drug use: No  . Sexual activity: Not Currently   Other Topics Concern  . Not on file   Social History Narrative   Married , lives w/ wife   Right-handed   Rare caffeine intake           Medication List       Accurate as of 12/19/15  7:28 PM. Always use your most recent med list.          acetaminophen 500 MG tablet Commonly known as:  TYLENOL Take  500-1,000 mg by mouth every 4 (four) hours as needed. For pain   albuterol 108 (90 Base) MCG/ACT inhaler Commonly known as:  VENTOLIN HFA Inhale 2 puffs into the lungs every 6 (six) hours as needed for wheezing or shortness of breath.   ASPERCREME LIDOCAINE 4 % Liqd Generic drug:  Lidocaine HCl Apply topically.   BENADRYL 25 MG tablet Generic drug:  diphenhydrAMINE Take 25 mg by mouth every 6 (six) hours as needed.   BIOFREEZE ROLL-ON EX Apply topically.   DOCUSATE SODIUM PO Take by mouth. Reported on 01/26/2015   ENBREL 50 MG/ML injection Generic drug:  etanercept Inject 50 mg into the skin once a week.   esomeprazole 10 MG packet Commonly known as:  NEXIUM Take 10 mg by mouth daily before breakfast.   fish oil-omega-3 fatty acids 1000 MG capsule Take 1 g by mouth 2 (two) times daily.   fluticasone 50 MCG/ACT nasal spray Commonly known as:  FLONASE Place 1 spray into both nostrils daily.   folic acid 1 MG tablet Commonly known as:  FOLVITE Take 1 mg by mouth daily.   freestyle lancets Check blood sugars no more than twice daily.   gabapentin 300 MG capsule Commonly known as:  NEURONTIN Take 1 capsule (300 mg total) by mouth 4 (four) times daily.   GAS-X PO Take by mouth. Reported on 01/26/2015   ACCU-CHEK SMARTVIEW test strip Generic drug:  glucose blood Reported on 05/24/2015   glucose blood test strip Commonly known as:  FREESTYLE LITE Check blood sugar no more than twice daily.   hydrocortisone 2.5 % cream Apply 1 application topically 2 (two) times daily. Reported on 01/26/2015   ketoconazole 2 % cream Commonly known as:  NIZORAL Apply 1 application topically daily.   lidocaine 5 % Commonly known as:  LIDODERM Place 2 patches onto the skin daily.   METAMUCIL PO Take by mouth.   metFORMIN 1000 MG tablet Commonly known as:  GLUCOPHAGE Take 1 tablet (1,000 mg total) by mouth 2 (two) times daily with a meal.   Methotrexate (PF) 20 MG/0.4ML  Soaj Inject into the skin. Once a week   MYRBETRIQ 50 MG Tb24 tablet Generic drug:  mirabegron ER   nortriptyline 10 MG capsule Commonly known as:  PAMELOR Take 1 capsule (10 mg total) by mouth at bedtime.   ONE TOUCH ULTRA SYSTEM KIT w/Device Kit Use device to check blood sugar.   PROCTOSOL HC 2.5 % rectal cream Generic  drug:  hydrocortisone Reported on 01/26/2015   Pseudoephedrine HCl ([place]) 30 MG Taba Take 1 tablet by mouth as needed. Reported on 01/26/2015   rizatriptan 10 MG disintegrating tablet Commonly known as:  MAXALT-MLT Take 1 tablet PO at onset of migraine- may repeat in two hours if needed.   simvastatin 20 MG tablet Commonly known as:  ZOCOR Take 1 tablet (20 mg total) by mouth at bedtime.   tamsulosin 0.4 MG Caps capsule Commonly known as:  FLOMAX Take 0.4 mg by mouth daily after supper.   topiramate 100 MG tablet Commonly known as:  TOPAMAX Take 1 tablet (100 mg total) by mouth 2 (two) times daily.   verapamil 120 MG CR tablet Commonly known as:  CALAN-SR Take 1 tablet (120 mg total) by mouth at bedtime.   XIIDRA 5 % Soln Generic drug:  Lifitegrast Apply to eye.          Objective:   Physical Exam BP 134/72 (BP Location: Left Arm, Patient Position: Sitting, Cuff Size: Small)   Pulse (!) 102   Temp 98.1 F (36.7 C) (Oral)   Resp 14   Ht _0  (1.626 m)   Wt 127 lb 6 oz (57.8 kg)   SpO2 98%   BMI 21.86 kg/m  General:   Well developed, well nourished . NAD.  HEENT:  Normocephalic . Face symmetric, atraumatic. TMs wnl. Throat symmetric; nose slt congested  EOMI, pupils equal and reactive Lungs:  Few rhonchi bilaterally otherwise clear  Normal respiratory effort, no intercostal retractions, no accessory muscle use. Heart: Rate around 100,  no murmur.  No pretibial edema bilaterally  Skin: Not pale. Not jaundice Neurologic:  alert & oriented X3.  Speech normal, gait appropriate for age and unassisted Motor symmetric  Psych--   Cognition and judgment appear intact.  Cooperative with normal attention span and concentration.  Behavior appropriate. No anxious or depressed appearing.      Assessment & Plan:   Assessment  DM 2012 Hyperlipidemia zocor  GERD Abnormal gallbladder per CT-- Saw surgery 12-16: No surgery rx MSK: ---Pain mngmt: reports a severe allergic reaction to  NSAIDs-codeine-MSO4 ; can tolerate oxycodone but the pain relief is just mild ; intolerant to Cymbalta 10-2015 ---Rheumatoid arthritis -- on enbrel - MTX (Dr Elpidio Galea) ---DJD multiple sites, cervical spondylosis -- gabapentin ---CTS ---neuropathy on-off, hands R>L Headaches --topamax GU: sees urology --Elevated PSA --BPH Skin cancer, BCC  PLAN: Headaches: H/o  migraine HAs,  increase since May 2017, saw neurology a few times, increased Topamax is not helping, Maxalt helps but he can't take every day, nortriptyline didn't help, status post 2 rounds of  steroid. Description  suggest cluster headaches, other considerations are worsening migraines,  Sinus infection, cervicogenic pain, neuralgia, etc. He is intolerant/allergic to NSAIDs. Plan:  BMP, sedimentation rate, CBC, CT sinus. Trial with verapamil as a preventive and topical lidocaine as abortive. (  lidocaine 4% nasal >>> One or 2 sprays left nostril, may repeat in 10 minutes if needed. No more than 3 doses in a 24-hour period or 3 days in a week. [phone]. Appreciate our pharmacist for the information. Pt  verbalized understanding of the instructions.). RTC 3 weeks

## 2015-12-19 NOTE — Patient Instructions (Addendum)
GO TO THE LAB : Get the blood work     GO TO THE FRONT DESK Schedule your next appointment for a  follow-up in 3 weeks  Verapamil 1 tablet daily to prevent headaches  Once you have a headache, use lidocaine as needed. More instructions to come.  Schedule a CT

## 2015-12-19 NOTE — Assessment & Plan Note (Signed)
Headaches: H/o  migraine HAs,  increase since May 2017, saw neurology a few times, increased Topamax is not helping, Maxalt helps but he can't take every day, nortriptyline didn't help, status post 2 rounds of  steroid. Description  suggest cluster headaches, other considerations are worsening migraines,  Sinus infection, cervicogenic pain, neuralgia, etc. He is intolerant/allergic to NSAIDs. Plan:  BMP, sedimentation rate, CBC, CT sinus. Trial with verapamil as a preventive and topical lidocaine as abortive. (  lidocaine 4% nasal >>> One or 2 sprays left nostril, may repeat in 10 minutes if needed. No more than 3 doses in a 24-hour period or 3 days in a week. [facility] [phone]. Appreciate our pharmacist for the information. Pt  verbalized understanding of the instructions.). RTC 3 weeks

## 2015-12-19 NOTE — Progress Notes (Signed)
Pre visit review using our clinic review tool, if applicable. No additional management support is needed unless otherwise documented below in the visit note. 

## 2015-12-20 ENCOUNTER — Ambulatory Visit (HOSPITAL_BASED_OUTPATIENT_CLINIC_OR_DEPARTMENT_OTHER): Payer: BLUE CROSS/BLUE SHIELD

## 2015-12-20 LAB — CBC WITH DIFFERENTIAL/PLATELET
BASOS PCT: 0.6 % (ref 0.0–3.0)
Basophils Absolute: 0.1 10*3/uL (ref 0.0–0.1)
EOS ABS: 0.2 10*3/uL (ref 0.0–0.7)
EOS PCT: 2.8 % (ref 0.0–5.0)
HEMATOCRIT: 35.6 % — AB (ref 39.0–52.0)
HEMOGLOBIN: 11.8 g/dL — AB (ref 13.0–17.0)
LYMPHS PCT: 28.3 % (ref 12.0–46.0)
Lymphs Abs: 2.2 10*3/uL (ref 0.7–4.0)
MCHC: 33 g/dL (ref 30.0–36.0)
MCV: 90.1 fl (ref 78.0–100.0)
MONO ABS: 0.6 10*3/uL (ref 0.1–1.0)
Monocytes Relative: 7.6 % (ref 3.0–12.0)
Neutro Abs: 4.8 10*3/uL (ref 1.4–7.7)
Neutrophils Relative %: 60.7 % (ref 43.0–77.0)
Platelets: 470 10*3/uL — ABNORMAL HIGH (ref 150.0–400.0)
RBC: 3.95 Mil/uL — AB (ref 4.22–5.81)
RDW: 15.6 % — ABNORMAL HIGH (ref 11.5–15.5)
WBC: 7.9 10*3/uL (ref 4.0–10.5)

## 2015-12-20 LAB — SEDIMENTATION RATE: SED RATE: 2 mm/h (ref 0–20)

## 2015-12-20 LAB — BASIC METABOLIC PANEL
BUN: 18 mg/dL (ref 6–23)
CHLORIDE: 109 meq/L (ref 96–112)
CO2: 26 mEq/L (ref 19–32)
Calcium: 9.7 mg/dL (ref 8.4–10.5)
Creatinine, Ser: 0.92 mg/dL (ref 0.40–1.50)
GFR: 86.66 mL/min (ref >60.00)
Glucose, Bld: 130 mg/dL — ABNORMAL HIGH (ref 70–99)
POTASSIUM: 4.9 meq/L (ref 3.5–5.1)
SODIUM: 142 meq/L (ref 135–145)

## 2015-12-25 ENCOUNTER — Telehealth: Payer: Self-pay | Admitting: Internal Medicine

## 2015-12-25 NOTE — Telephone Encounter (Signed)
Relation to [id number] Call back number: [phone]    Reason for call:  Patient would like to discuss new medication prescribed by PCP

## 2015-12-25 NOTE — Telephone Encounter (Signed)
FYI. Pt Rx'ed compound Lidocaine and Verapamil.

## 2015-12-26 NOTE — Telephone Encounter (Signed)
Spoke with patient, verapamil has definitely decreased in intensity and frequency of headaches. Has used the lidocaine few times. He also increase gabapentin back to 5 times a day. We talk about possibly increase verapamil to get even better control, we agreed  he will think about it, will also check his BP and heart rate to be sure would be ok to increase CCBs

## 2015-12-27 ENCOUNTER — Encounter: Payer: Self-pay | Admitting: Physical Medicine & Rehabilitation

## 2015-12-27 ENCOUNTER — Encounter
Payer: BLUE CROSS/BLUE SHIELD | Attending: Physical Medicine & Rehabilitation | Admitting: Physical Medicine & Rehabilitation

## 2015-12-27 VITALS — BP 116/65 | HR 87 | Resp 14

## 2015-12-27 DIAGNOSIS — K219 Gastro-esophageal reflux disease without esophagitis: Secondary | ICD-10-CM | POA: Insufficient documentation

## 2015-12-27 DIAGNOSIS — M542 Cervicalgia: Secondary | ICD-10-CM | POA: Insufficient documentation

## 2015-12-27 DIAGNOSIS — M545 Low back pain, unspecified: Secondary | ICD-10-CM

## 2015-12-27 DIAGNOSIS — E119 Type 2 diabetes mellitus without complications: Secondary | ICD-10-CM | POA: Diagnosis not present

## 2015-12-27 DIAGNOSIS — G43009 Migraine without aura, not intractable, without status migrainosus: Secondary | ICD-10-CM | POA: Insufficient documentation

## 2015-12-27 DIAGNOSIS — G479 Sleep disorder, unspecified: Secondary | ICD-10-CM

## 2015-12-27 DIAGNOSIS — E785 Hyperlipidemia, unspecified: Secondary | ICD-10-CM | POA: Insufficient documentation

## 2015-12-27 DIAGNOSIS — M797 Fibromyalgia: Secondary | ICD-10-CM

## 2015-12-27 DIAGNOSIS — M159 Polyosteoarthritis, unspecified: Secondary | ICD-10-CM

## 2015-12-27 DIAGNOSIS — G8929 Other chronic pain: Secondary | ICD-10-CM

## 2015-12-27 NOTE — Progress Notes (Signed)
Subjective:    Patient ID: Christian Roberts, male    DOB: 09-04-1945, 70 y.o.   MRN: [id number]  HPI  70 y/o male with pmh of HLD, GERD, chronic neck pain, DM type 2, parasthesias b/l hands, generalized OA, cervical spondylosis and disc disease, b/l rotator cuff tear and biceps tendon tear headache and psh of ankle fusion, b/l CTS release, neck surgery, b/l shoulder repair presents for follow up for pain all over >> low back pain.  This started ~03/2015.  He denies inciting event.  Getting progressively worse.  It is bilateral.  Lidoderm patches help with the pain.  Excessive activity exacerbates the pain.  Describes the pain as constant throbbing.  It radiates to his hips.  He denies associated numbness, tingling weakness.  He has not tried anything else for his lower back.  Pain prevents from doing activities he enjoys.   Last seen by NP on 10/17/15.  At that time his Cymbalta was weaned due to lethargy. Since last visit he denies falls.  He complains of headaches, but they are being managed by PCP.  He saw PT, but has not gone for his final visit.  TENS unit was helpful.  Heat continues to help.  Lidoderm continues to help.  He states he is limited in pool therapy due to joint limitation.  Sleep is variable. Overall, pt states he is doing slightly better.   Pain Inventory Average Pain 5 Pain Right Now 5 My pain is intermittent, sharp, burning, dull, stabbing, tingling and aching  In the last 24 hours, has pain interfered with the following? General activity 6 Relation with others 0 Enjoyment of life 6 What TIME of day is your pain at its worst? morning, night Sleep (in general) Fair  Pain is worse with: NA Pain improves with: heat/ice, therapy/exercise, pacing activities, medication, TENS and injections Relief from Meds: 4  Mobility walk without assistance walk with assistance use a cane how many minutes can you walk? 60 ability to climb steps?  yes do you drive?  yes Do you have  any goals in this area?  yes  Function retired  Neuro/Psych bladder control problems bowel control problems numbness tingling dizziness loss of taste or smell  Prior Studies Any changes since last visit?  yes x-rays CT/MRI nerve study  Physicians involved in your care Any changes since last visit?  no   Family History  Problem Relation Age of Onset  . Stroke Mother   . Anesthesia problems Mother   . Cerebral aneurysm Father   . Skin cancer Father     melanoma  . Stroke Father   . Diabetes Sister     hypoglycemia  . Breast cancer Sister     x 2  . Lung cancer Sister   . Atrial fibrillation Sister   . Lung cancer Sister   . Emphysema Sister   . Atrial fibrillation Sister   . Colon cancer Neg Hx   . Prostate cancer Neg Hx    Social History   Social History  . Marital status: Married    Spouse name: Arbie Cookey  . Number of children: 3  . Years of education: BA+   Occupational History  . 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 Smoking status: Current Every Day Smoker    Packs/day: 5.00    Types: Cigarettes    Last attempt to quit: 04/10/2012  . Smokeless tobacco: Never Used     Comment: quit on-off  since 2014   . Alcohol use No  . Drug use: No  . Sexual activity: Not Currently   Other Topics Concern  . None   Social History Narrative   Married , lives w/ wife   Right-handed   Rare caffeine intake      Past Surgical History:  Procedure Laterality Date  . ANKLE FUSION Left 03-19-13   Dr Mardelle Matte  . ANKLE SURGERY Left   . APPENDECTOMY     1964  . BACK SURGERY     cerv. fusion C1- T1, 2 separate surgeries , T1-T3 2014  . CARPAL TUNNEL RELEASE Right   . CARPAL TUNNEL RELEASE Left 07-14-2014   [facility]  . CYSTOSCOPY     multiple times for infections  . INGUINAL HERNIA REPAIR  03/27/2011   Procedure: HERNIA REPAIR INGUINAL ADULT;  Surgeon: Harl Bowie, MD;  Location: [facility];  Service: General;  Laterality: Left;  . INGUINAL HERNIA  REPAIR Bilateral 12/09/2012   Procedure: LAPAROSCOPIC BILATERAL INGUINAL HERNIA REPAIR WITH MESH;  Surgeon: Harl Bowie, MD;  Location[facility];  Service: General;  Laterality: Bilateral;  . INSERTION OF MESH Bilateral 12/09/2012   Procedure: INSERTION OF MESH;  Surgeon: Harl Bowie, MD;  Location[facility];  Service: General;  Laterality: Bilateral;  . left ankle mass removed  1972   3 surgeries on L ankle- 73, 2008, 2009  . NECK SURGERY  03/11,    C3-C7  . RECTAL SURGERY  at birth   "[place]" SURGICAL CORRECTION  . ROTATOR CUFF REPAIR W/ DISTAL CLAVICLE EXCISION Right 08/25/2014   And acromioplasty, Dr. Mardelle Matte  . SHOULDER SURGERY  09/17/12  . TONSILLECTOMY    . VARICOCELECTOMY     Past Medical History:  Diagnosis Date  . Allergic rhinitis   . Asthma    "I outgrew it"  . Carpal tunnel syndrome   . Chronic rheumatic arthritis ([place]) 01/20/2014   Per Dr. Amil Amen   . Complete tear of rotator cuff 2016   Right, MRI in 2016, complete rupture of long head of the biceps as well as a tear of the subscapularis and supraspinatus tendons, Dr. Mardelle Matte  . Complication of anesthesia   . Constipation   . Diabetes mellitus    2012  . Elevated PSA   . GERD (gastroesophageal reflux disease)   . Headache(784.0)    headaches-related to sinuses & uses Tylenol   . Hemorrhoids   . Hernia   . Hyperlipidemia   . Nocturia   . Numbness of fingers   . Osteoarthritis    ankle- left, hands & neck & hip  . PONV (postoperative nausea and vomiting)    WITH ETHER  . Skin cancer, basal cell    sees derm   BP 116/65   Pulse 87   Resp 14   SpO2 95%   Opioid Risk Score:   Fall Risk Score:  `1  Depression screen PHQ 2/9  Depression screen [facility] 2/9 10/18/2015 08/24/2015 05/24/2015 01/26/2015 11/15/2014 09/17/2013 07/28/2012  Decreased Interest 0 2 0 0 0 0 0  Down, Depressed, Hopeless 0 2 0 0 0 0 0  PHQ - 2 Score 0 4 0 0 0 0 0  Altered sleeping - 3 - - - - -  Tired, decreased energy -  3 - - - - -  Change in appetite - 2 - - - - -  Feeling bad or failure about yourself  - 0 - - - - -  Trouble concentrating - 2 - - - - -  Moving slowly or fidgety/restless - 0 - - - - -  Suicidal thoughts - 0 - - - - -  PHQ-9 Score - 14 - - - - -  Difficult doing work/chores - Very difficult - - - - -  Some recent data might be hidden   Review of Systems  Constitutional: Positive for unexpected weight change.       Bladder control problems   HENT: Negative.   Eyes: Negative.   Respiratory: Positive for cough.   Cardiovascular: Positive for leg swelling.  Gastrointestinal: Positive for constipation.  Endocrine:       High and low blood sugar   Genitourinary: Positive for frequency and urgency.  Musculoskeletal: Positive for gait problem.  Allergic/Immunologic: Negative.   Neurological: Positive for dizziness and numbness.       Tingling    Hematological: Negative.   Psychiatric/Behavioral: Negative.   All other systems reviewed and are negative.     Objective:   Physical Exam Gen: NAD. Vital signs reviewed HENT: Normocephalic, Atraumatic Eyes: EOMI. No discharge.  Cardio: RRR. No discharge.  Pulm: B/l clear to auscultation.  Effort normal Abd: Soft, BS+ MSK:  Gait minimally antalgic.   No TTP over left iliac crest.    No edema.   Neg FABERs.  Neuro:     Sensation intact to light touch in all LE dermatomes  Reflexes 1+ throughout  Strength  5/5 in all LE myotomes  Neg SLR B/l Skin: Warm and Dry    Assessment & Plan:  70 y/o male with pmh of HLD, GERD, chronic neck pain, DM type 2, parasthesias b/l hands, generalized OA, cervical spondylosis and disc disease, b/l rotator cuff tear and biceps tendon tear headache and psh of ankle fusion, b/l CTS release, neck surgery, b/l shoulder repair presents for follow up of pain all over >> low back pain.   1. Chronic low back pain  Pt states he has a severe allergy to NSAIDs   Pt has a listed allergy to  codeine/morphine  Unable to tolerate Cymbalta due to lethargy  Pt states his wife denied psychology referral  Cont PT for stretching, core strengthening and  Cont TENS, inquire about home use  Cont heat  Cont Lidoderm patches  Encouraged to pursue pool therapy  Cont Tylenol  Will consider brace in future, if necessary  2. Migraines  Cont meds per Neurology/PCP  Pt to speak with Neurology regarding potential for Botox injections again  3. Sleep disturbance  Pt currently taking 900-1200mg  Gabapentin  Secondary to pain, see #1  4. Generalized OA  See #1  5. Fibromyalgia  Unable to tolerate Cymbalta   Will consider Lyrica 75mg  BID, may increase to 150mg  BID on next visit and possible 225 BID therafter, if necessary, pt would like to cont current reg

## 2016-01-03 ENCOUNTER — Encounter: Payer: Self-pay | Admitting: Physical Therapy

## 2016-01-03 ENCOUNTER — Ambulatory Visit: Payer: BLUE CROSS/BLUE SHIELD | Attending: Physical Medicine & Rehabilitation | Admitting: Physical Therapy

## 2016-01-03 DIAGNOSIS — M62830 Muscle spasm of back: Secondary | ICD-10-CM | POA: Insufficient documentation

## 2016-01-03 DIAGNOSIS — M545 Low back pain, unspecified: Secondary | ICD-10-CM

## 2016-01-03 DIAGNOSIS — G8929 Other chronic pain: Secondary | ICD-10-CM | POA: Insufficient documentation

## 2016-01-03 NOTE — Patient Instructions (Addendum)
Gave pt  Handout on recommended TENS unit to order from [facility]. I told the pt the he could return to receive proper instructions on how to use it if he needed it without scheduling an official appointment.

## 2016-01-03 NOTE — Therapy (Signed)
[facility] [place], [place], [address] Phone: [phone]   Fax:  [phone]  Physical Therapy Treatment  Patient Details  Name: Christian Roberts MRN: [id number] Date of Birth: 1945/06/30 Referring Provider: Posey Pronto  Encounter Date: 01/03/2016      PT End of Session - 01/03/16 1527    Visit Number 8   Date for PT Re-Evaluation 11/12/15   PT Start Time 1448   PT Stop Time 1543   PT Time Calculation (min) 55 min   Activity Tolerance Patient tolerated treatment well   Behavior During Therapy [facility] for tasks assessed/performed      Past Medical History:  Diagnosis Date  . Allergic rhinitis   . Asthma    "I outgrew it"  . Carpal tunnel syndrome   . Chronic rheumatic arthritis ([place]) 01/20/2014   Per Dr. Amil Amen   . Complete tear of rotator cuff 2016   Right, MRI in 2016, complete rupture of long head of the biceps as well as a tear of the subscapularis and supraspinatus tendons, Dr. Mardelle Matte  . Complication of anesthesia   . Constipation   . Diabetes mellitus    2012  . Elevated PSA   . GERD (gastroesophageal reflux disease)   . Headache(784.0)    headaches-related to sinuses & uses Tylenol   . Hemorrhoids   . Hernia   . Hyperlipidemia   . Nocturia   . Numbness of fingers   . Osteoarthritis    ankle- left, hands & neck & hip  . PONV (postoperative nausea and vomiting)    WITH ETHER  . Skin cancer, basal cell    sees derm    Past Surgical History:  Procedure Laterality Date  . ANKLE FUSION Left 03-19-13   Dr Mardelle Matte  . ANKLE SURGERY Left   . APPENDECTOMY     1964  . BACK SURGERY     cerv. fusion C1- T1, 2 separate surgeries , T1-T3 2014  . CARPAL TUNNEL RELEASE Right   . CARPAL TUNNEL RELEASE Left 07-14-2014   [place]  . CYSTOSCOPY     multiple times for infections  . INGUINAL HERNIA REPAIR  03/27/2011   Procedure: HERNIA REPAIR INGUINAL ADULT;  Surgeon: Harl Bowie, MD;  Location:  [place];  Service: General;  Laterality: Left;  . INGUINAL HERNIA REPAIR Bilateral 12/09/2012   Procedure: LAPAROSCOPIC BILATERAL INGUINAL HERNIA REPAIR WITH MESH;  Surgeon: Harl Bowie, MD;  Location: [facility];  Service: General;  Laterality: Bilateral;  . INSERTION OF MESH Bilateral 12/09/2012   Procedure: INSERTION OF MESH;  Surgeon: Harl Bowie, MD;  Location: [facility];  Service: General;  Laterality: Bilateral;  . left ankle mass removed  1972   3 surgeries on L ankle- 73, 2008, 2009  . NECK SURGERY  03/11,    C3-C7  . RECTAL SURGERY  at birth   "[place]" SURGICAL CORRECTION  . ROTATOR CUFF REPAIR W/ DISTAL CLAVICLE EXCISION Right 08/25/2014   And acromioplasty, Dr. Mardelle Matte  . SHOULDER SURGERY  09/17/12  . TONSILLECTOMY    . VARICOCELECTOMY      There were no vitals filed for this visit.      Subjective Assessment - 01/03/16 1451    Subjective "Im doing better than most cases"   Currently in Pain? Yes   Pain Score 6    Pain Location --  Head and neck  [facility] PT Treatment/Exercise - 01/03/16 0001      Lumbar Exercises: Aerobic   Stationary Bike NuStep L4 x 7 min    Elliptical I10 R 3 x3 min    UBE (Upper Arm Bike) L3 x 49fd/2rev     Lumbar Exercises: Seated   Other Seated Lumbar Exercises Rows & Lats 25lb 2x15; back ext black band 2x10     Modalities   Modalities Moist Heat;Electrical Stimulation     Moist Heat Therapy   Number Minutes Moist Heat 15 Minutes   Moist Heat Location Cervical     Electrical Stimulation   Electrical Stimulation Location cervical area   Electrical Stimulation Action IFC   Electrical Stimulation Parameters supine   Electrical Stimulation Goals Pain                  PT Short Term Goals - 09/21/15 1516      PT SHORT TERM GOAL #1   Title independent with initial HEP   Status Partially Met           PT Long Term Goals - 01/03/16 1532      PT LONG TERM  GOAL #1   Title understand proper posture and body mechanics   Status Achieved     PT LONG TERM GOAL #2   Title decrease pain 25%   Status Achieved     PT LONG TERM GOAL #3   Title increase lumbar ROM 25%   Status Achieved     PT LONG TERM GOAL #4   Title report being able to stand to cook a meal x 10 minutes without pain >6/10   Status Achieved               Plan - 01/03/16 1528    Clinical Impression Statement Pt repots that he has had a total of 4 deaths in his family causing him to be absent from therapy. Pt reports that his back and shoulder are better but has some next pain. Pt reports that his MD wanted him to return to assure safety with machine level interventions. Pt reports that she felt comfortable with the exercises but was unsure how to use the cardio machines. Pt educated that the set up from each cardio machine would be different but most of them do have a quick start feature. Pt also informed that he should be able to maintain a conversation while on the cardio machine to assess exertion.    Rehab Potential Fair   PT Frequency 2x / week   PT Duration 8 weeks   PT Treatment/Interventions ADLs/Self Care Home Management;Moist Heat;Traction;Ultrasound;Electrical Stimulation;Cryotherapy;Patient/family education;Therapeutic exercise;Therapeutic activities;Manual techniques   PT Next Visit Plan D/C      Patient will benefit from skilled therapeutic intervention in order to improve the following deficits and impairments:  Abnormal gait, Decreased activity tolerance, Decreased mobility, Decreased range of motion, Decreased strength, Increased muscle spasms, Impaired flexibility, Postural dysfunction, Improper body mechanics, Pain  Visit Diagnosis: Chronic midline low back pain without sciatica  Muscle spasm of back  Acute midline low back pain without sciatica     Problem List Patient Active Problem List   Diagnosis Date Noted  . Carpal tunnel syndrome  12/26/2014  . Acute bronchitis 12/08/2014  . Hypersomnolence 12/08/2014  . Headache disorder 11/14/2014  . Paresthesia of both hands 11/14/2014  . PCP NOTES >>>>>>>>>>>>>>>>>> 11/14/2014  . Rectal bleeding 11/08/2014  . Diarrhea 11/08/2014  . Renal cyst 11/08/2014  . Liver cyst 11/08/2014  .  Abnormal CT of the abdomen 11/08/2014  . Annual physical exam 01/20/2014  . Chronic rheumatic arthritis ([place]) 01/20/2014  . Skin infection 12/29/2013  . Skin lesion 07/19/2013  . Recurrent left inguinal hernia 11/02/2012  . Hemorrhoids 05/29/2012  . Left inguinal hernia 03/12/2011  . Diabetes type 2, controlled ([place]) 08/20/2010  . Hyperlipidemia 09/20/2009  . Elevated PSA--BPH-- Dr Risa Grill 07/21/2009  . Neck pain, radiculopathy, s/p surgery, on gabapentin 12/12/2008  . OTHER SPECIFIED DISORDER OF THE ESOPHAGUS 08/24/2008  . GERD 03/30/2007  . DJD (degenerative joint disease) 07/31/2006     PHYSICAL THERAPY DISCHARGE SUMMARY  Visits from Start of Care: 8 Plan: Patient agrees to discharge.  Patient goals were met. Patient is being discharged due to meeting the stated rehab goals.  ?????       Scot Jun, PTA 01/03/2016, 3:40 PM  [address] [place] [place], [place], [address] Phone: [phone]   Fax:  [phone]  Name: Christian Roberts MRN: [id number] Date of Birth: 07-Mar-1945

## 2016-01-10 LAB — PSA: PSA: 5.92

## 2016-01-26 ENCOUNTER — Telehealth: Payer: Self-pay

## 2016-01-26 NOTE — Telephone Encounter (Signed)
Follow up call made to patient. States he had bleeding from nose as well as rectally. Asked patient if there was a reason why he has not been to ED. States he has put things off. Patient states he will go to Saturday clinic tomorrow at [place] if bleeding becomes profuse he needed to go directly to ED. Patient agreed.

## 2016-01-27 ENCOUNTER — Encounter: Payer: Self-pay | Admitting: Family Medicine

## 2016-01-27 ENCOUNTER — Ambulatory Visit (INDEPENDENT_AMBULATORY_CARE_PROVIDER_SITE_OTHER): Payer: BLUE CROSS/BLUE SHIELD | Admitting: Family Medicine

## 2016-01-27 DIAGNOSIS — J322 Chronic ethmoidal sinusitis: Secondary | ICD-10-CM | POA: Insufficient documentation

## 2016-01-27 DIAGNOSIS — R040 Epistaxis: Secondary | ICD-10-CM | POA: Diagnosis not present

## 2016-01-27 DIAGNOSIS — J0120 Acute ethmoidal sinusitis, unspecified: Secondary | ICD-10-CM | POA: Diagnosis not present

## 2016-01-27 MED ORDER — PREDNISONE 10 MG PO TABS: ORAL_TABLET | ORAL | 0 refills | Status: DC

## 2016-01-27 NOTE — Progress Notes (Signed)
   Subjective:    Patient ID: Christian Roberts, male    DOB: [birth year], 71 y.o.   MRN: [id number]  URI   This is a new problem. The problem has been gradually worsening. There has been no fever. Associated symptoms include congestion and sinus pain. Pertinent negatives include no ear pain, sore throat, swollen glands or wheezing. Associated symptoms comments: Nose bleeds, thick clots of blood ongoing for 2 weeks off and on.  Initially with sinus pressure, now severe pain behid nose.  Feeling like nose very dry.   no SOB . Treatments tried: using vaporizer, using Qtip on nose, sudafed flonase but stopped this, does nasal saline daily. The treatment provided mild relief.    He has also been having loose stool and at times off and on bleeding hemorrhoids. Blood on toilet tissue from constant wiping. No resolved.  He has history of anemia... He is concerned he is anemia.  pocc mild dizzy and lightheaded.   Chronic headaches much improved on new migraine med.  Has appt PCP.  He is a smoker.  Review of Systems  HENT: Positive for congestion and sinus pain. Negative for ear pain and sore throat.   Respiratory: Negative for wheezing.    Vitals:   01/27/16 0919  BP: 120/80  Pulse: 88  Temp: 97.6 F (36.4 C)      Objective:   Physical Exam  Constitutional: Vital signs are normal. He appears well-developed and well-nourished.  Non-toxic appearance. He does not appear ill. No distress.  HENT:  Head: Normocephalic and atraumatic.  Right Ear: Hearing, tympanic membrane, external ear and ear canal normal. No tenderness. No foreign bodies. Tympanic membrane is not retracted and not bulging.  Left Ear: Hearing, tympanic membrane, external ear and ear canal normal. No tenderness. No foreign bodies. Tympanic membrane is not retracted and not bulging.  Nose: No mucosal edema or rhinorrhea. Right sinus exhibits maxillary sinus tenderness. Right sinus exhibits no frontal sinus tenderness. Left  sinus exhibits maxillary sinus tenderness. Left sinus exhibits no frontal sinus tenderness.  Mouth/Throat: Uvula is midline, oropharynx is clear and moist and mucous membranes are normal. Normal dentition. No dental caries. No oropharyngeal exudate or tonsillar abscesses.  Ethmoid tenderness mainly   multiple scabs in left and right nares, no active bleeding  Eyes: Conjunctivae, EOM and lids are normal. Pupils are equal, round, and reactive to light. Lids are everted and swept, no foreign bodies found.  Neck: Trachea normal, normal range of motion and phonation normal. Neck supple. Carotid bruit is not present. No thyroid mass and no thyromegaly present.  Cardiovascular: Normal rate, regular rhythm, S1 normal, S2 normal, normal heart sounds, intact distal pulses and normal pulses.  Exam reveals no gallop.   No murmur heard. Pulmonary/Chest: Effort normal and breath sounds normal. No respiratory distress. He has no wheezes. He has no rhonchi. He has no rales.  Abdominal: Soft. Normal appearance and bowel sounds are normal. There is no hepatosplenomegaly. There is no tenderness. There is no rebound, no guarding and no CVA tenderness. No hernia.  Neurological: He is alert. He has normal reflexes.  Skin: Skin is warm, dry and intact. No rash noted.  Psychiatric: He has a normal mood and affect. His speech is normal and behavior is normal. Judgment normal.          Assessment & Plan:

## 2016-01-27 NOTE — Assessment & Plan Note (Signed)
Pt dislodging scabs with multiple nasal treatments and Q-tips.  No sign of severe anemia, but consider lab eval at follow up on Monday AM.

## 2016-01-27 NOTE — Progress Notes (Signed)
Pre visit review using our clinic review tool, if applicable. No additional management support is needed unless otherwise documented below in the visit note. 

## 2016-01-27 NOTE — Addendum Note (Signed)
Addended by: Karle Barr on: 01/27/2016 09:59 AM   Modules accepted: Orders

## 2016-01-27 NOTE — Assessment & Plan Note (Signed)
No sign of current bacterial infeciton. Will have him stop other meds. Treat presure with steroid taper.

## 2016-01-27 NOTE — Patient Instructions (Addendum)
Quit smoking!  Stop all nasal sprays, do not use Qtips. May be dislodging scabs.  Can use humidifier and humidifying gel under nose.  If nosebleed more than 10 min.. Go to ER.  Complete prednisone taper. Keep appt with Dr. Larose Kells on Monday.

## 2016-01-29 ENCOUNTER — Encounter: Payer: Self-pay | Admitting: Internal Medicine

## 2016-01-29 ENCOUNTER — Ambulatory Visit (INDEPENDENT_AMBULATORY_CARE_PROVIDER_SITE_OTHER): Payer: BLUE CROSS/BLUE SHIELD | Admitting: Internal Medicine

## 2016-01-29 VITALS — BP 126/66 | HR 102 | Temp 97.6°F | Resp 14 | Ht 64.0 in | Wt 137.4 lb

## 2016-01-29 DIAGNOSIS — R040 Epistaxis: Secondary | ICD-10-CM

## 2016-01-29 DIAGNOSIS — D649 Anemia, unspecified: Secondary | ICD-10-CM

## 2016-01-29 DIAGNOSIS — R51 Headache: Secondary | ICD-10-CM | POA: Diagnosis not present

## 2016-01-29 DIAGNOSIS — J209 Acute bronchitis, unspecified: Secondary | ICD-10-CM

## 2016-01-29 DIAGNOSIS — R519 Headache, unspecified: Secondary | ICD-10-CM

## 2016-01-29 LAB — IRON: IRON: 65 ug/dL (ref 42–165)

## 2016-01-29 LAB — FERRITIN: FERRITIN: 21.4 ng/mL — AB (ref 22.0–322.0)

## 2016-01-29 LAB — CBC WITH DIFFERENTIAL/PLATELET
BASOS ABS: 0 10*3/uL (ref 0.0–0.1)
BASOS PCT: 0.2 % (ref 0.0–3.0)
EOS ABS: 0 10*3/uL (ref 0.0–0.7)
Eosinophils Relative: 0.2 % (ref 0.0–5.0)
HEMATOCRIT: 34.4 % — AB (ref 39.0–52.0)
HEMOGLOBIN: 11.5 g/dL — AB (ref 13.0–17.0)
LYMPHS PCT: 16.5 % (ref 12.0–46.0)
Lymphs Abs: 1.6 10*3/uL (ref 0.7–4.0)
MCHC: 33.3 g/dL (ref 30.0–36.0)
MCV: 90.3 fl (ref 78.0–100.0)
MONO ABS: 0.5 10*3/uL (ref 0.1–1.0)
Monocytes Relative: 5.2 % (ref 3.0–12.0)
Neutro Abs: 7.7 10*3/uL (ref 1.4–7.7)
Neutrophils Relative %: 77.9 % — ABNORMAL HIGH (ref 43.0–77.0)
Platelets: 386 10*3/uL (ref 150.0–400.0)
RBC: 3.82 Mil/uL — AB (ref 4.22–5.81)
RDW: 16 % — ABNORMAL HIGH (ref 11.5–15.5)
WBC: 9.9 10*3/uL (ref 4.0–10.5)

## 2016-01-29 LAB — VITAMIN B12: VITAMIN B 12: 304 pg/mL (ref 211–911)

## 2016-01-29 LAB — FOLATE

## 2016-01-29 MED ORDER — AMOXICILLIN 875 MG PO TABS: 875.0000 mg | ORAL_TABLET | Freq: Two times a day (BID) | ORAL | 0 refills | Status: DC

## 2016-01-29 NOTE — Assessment & Plan Note (Signed)
HAs: Great response to verapamil, sx are much less frequent/intense, has use lidocaine twice with good results Epistaxis: New issue, no chronic sxs, onset 2 weeks ago, improving with conservative treatment .  Sinusitis? Sinus congestion slightly worse than baseline for few days, doubt acute bacterial sinusitis. Recommend saline nasal spray and Flonase. Addendum: Patient concerned b/c  he takes immunosuppressant, I agree that he is a high risk pt,  I sent a Rx for penicillin, to use if not improving in the next 2 or 3 days. Anemia: H/o anemia, a year ago iron was checked, was slt low, was recommended to see GI but that never happened. Plan-- CBC, iron, ferritin, folic acid, [id number] and refer to GI. Hemorrhoids: On and off problem for years, was quite symptomatic a couple of weeks ago but that has resolved. RTC set for 04-2016, CPX

## 2016-01-29 NOTE — Patient Instructions (Addendum)
For sinus congestion: Flonase 2 sprays daily in the mornings Saline nasal spray as needed

## 2016-01-29 NOTE — Progress Notes (Signed)
Subjective:    Patient ID: Christian Roberts, male    DOB: 21-Nov-1945, 71 y.o.   MRN: [id number]  DOS:  01/29/2016 Type of visit - description : Follow-up, several concerns Interval history: Headaches: Was prescribed verapamil and lidocaine topical, great response to treatment w/ decreased frequency and intensity of headaches. History of anemia: Concern about it, was recommended to see GI, he never did. Sinusitis? Nasal congestion, maxillary areas bilaterally for few days, was prescribed prednisone couple of days ago, symptoms decreased but not gone. 2 weeks history of  nose bleeds, daily, sometimes he saw clots, mostly left nostril. Went to the Saturday clinic, Rx conservative treatment, now sx have slowed down.  Review of Systems Denies fever, chills. No brown nasal discharge. History of hemorrhoids, had on and off bleeding for the last few weeks that has resolved. Is a chronic issue. Minimal cough.  Past Medical History:  Diagnosis Date  . Allergic rhinitis   . Asthma    "I outgrew it"  . Carpal tunnel syndrome   . Chronic rheumatic arthritis ([place]) 01/20/2014   Per Dr. Amil Amen   . Complete tear of rotator cuff 2016   Right, MRI in 2016, complete rupture of long head of the biceps as well as a tear of the subscapularis and supraspinatus tendons, Dr. Mardelle Matte  . Complication of anesthesia   . Constipation   . Diabetes mellitus    2012  . Elevated PSA   . GERD (gastroesophageal reflux disease)   . Headache(784.0)    headaches-related to sinuses & uses Tylenol   . Hemorrhoids   . Hernia   . Hyperlipidemia   . Nocturia   . Numbness of fingers   . Osteoarthritis    ankle- left, hands & neck & hip  . PONV (postoperative nausea and vomiting)    WITH ETHER  . Skin cancer, basal cell    sees derm    Past Surgical History:  Procedure Laterality Date  . ANKLE FUSION Left 03-19-13   Dr Mardelle Matte  . ANKLE SURGERY Left   . APPENDECTOMY     1964  . BACK SURGERY     cerv. fusion  C1- T1, 2 separate surgeries , T1-T3 2014  . CARPAL TUNNEL RELEASE Right   . CARPAL TUNNEL RELEASE Left 07-14-2014   [place]  . CYSTOSCOPY     multiple times for infections  . INGUINAL HERNIA REPAIR  03/27/2011   Procedure: HERNIA REPAIR INGUINAL ADULT;  Surgeon: Harl Bowie, MD;  Location: [facility];  Service: General;  Laterality: Left;  . INGUINAL HERNIA REPAIR Bilateral 12/09/2012   Procedure: LAPAROSCOPIC BILATERAL INGUINAL HERNIA REPAIR WITH MESH;  Surgeon: Harl Bowie, MD;  Location[facility];  Service: General;  Laterality: Bilateral;  . INSERTION OF MESH Bilateral 12/09/2012   Procedure: INSERTION OF MESH;  Surgeon: Harl Bowie, MD;  Location[facility];  Service: General;  Laterality: Bilateral;  . left ankle mass removed  1972   3 surgeries on L ankle- 73, 2008, 2009  . NECK SURGERY  03/11,    C3-C7  . RECTAL SURGERY  at birth   "[place]" SURGICAL CORRECTION  . ROTATOR CUFF REPAIR W/ DISTAL CLAVICLE EXCISION Right 08/25/2014   And acromioplasty, Dr. Mardelle Matte  . SHOULDER SURGERY  09/17/12  . TONSILLECTOMY    . VARICOCELECTOMY      Social History   Social History  . Marital status: Married    Spouse name: Arbie Cookey  . Number of  children: 3  . Years of education: BA+   Occupational History  . 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 Smoking status: Current Every Day Smoker    Packs/day: 5.00    Types: Cigarettes    Last attempt to quit: 04/10/2012  . Smokeless tobacco: Never Used     Comment: quit on-off since 2014   . Alcohol use No  . Drug use: No  . Sexual activity: Not Currently   Other Topics Concern  . Not on file   Social History Narrative   Married , lives w/ wife   Right-handed   Rare caffeine intake         Allergies as of 01/29/2016      Reactions   Aspirin Anaphylaxis   Codeine Phosphate Anaphylaxis   Ibuprofen Anaphylaxis   Levofloxacin Itching, Anaphylaxis   Menthol Swelling   Monosodium Glutamate Diarrhea,  Nausea And Vomiting   Other reaction(s): Cramps (ALLERGY/intolerance)   Morphine Sulfate Anaphylaxis   Naproxen Sodium Anaphylaxis   Nsaids Anaphylaxis   Other Itching   **BAND-AID**   Sulfamethoxazole Anaphylaxis   Cymbalta [duloxetine Hcl] Other (See Comments)   Dizziness, syncope, and sleepiness       Medication List       Accurate as of 01/29/16  6:21 PM. Always use your most recent med list.          acetaminophen 500 MG tablet Commonly known as:  TYLENOL Take 500-1,000 mg by mouth every 4 (four) hours as needed. For pain   albuterol 108 (90 Base) MCG/ACT inhaler Commonly known as:  VENTOLIN HFA Inhale 2 puffs into the lungs every 6 (six) hours as needed for wheezing or shortness of breath.   amoxicillin 875 MG tablet Commonly known as:  AMOXIL Take 1 tablet (875 mg total) by mouth 2 (two) times daily.   ASPERCREME LIDOCAINE 4 % Liqd Generic drug:  Lidocaine HCl Apply topically.   BENADRYL 25 MG tablet Generic drug:  diphenhydrAMINE Take 25 mg by mouth every 6 (six) hours as needed.   BIOFREEZE ROLL-ON EX Apply topically.   DOCUSATE SODIUM PO Take by mouth. Reported on 01/26/2015   ENBREL 50 MG/ML injection Generic drug:  etanercept Inject 50 mg into the skin once a week.   esomeprazole 10 MG packet Commonly known as:  NEXIUM Take 10 mg by mouth daily before breakfast.   fish oil-omega-3 fatty acids 1000 MG capsule Take 1 g by mouth 2 (two) times daily.   fluticasone 50 MCG/ACT nasal spray Commonly known as:  FLONASE Place 1 spray into both nostrils daily.   folic acid 1 MG tablet Commonly known as:  FOLVITE Take 1 mg by mouth daily.   freestyle lancets Check blood sugars no more than twice daily.   gabapentin 300 MG capsule Commonly known as:  NEURONTIN Take 1 capsule (300 mg total) by mouth 4 (four) times daily.   GAS-X PO Take by mouth. Reported on 01/26/2015   ACCU-CHEK SMARTVIEW test strip Generic drug:  glucose blood Reported on  05/24/2015   glucose blood test strip Commonly known as:  FREESTYLE LITE Check blood sugar no more than twice daily.   hydrocortisone 2.5 % cream Apply 1 application topically 2 (two) times daily. Reported on 01/26/2015   ketoconazole 2 % cream Commonly known as:  NIZORAL Apply 1 application topically daily.   lidocaine 5 % Commonly known as:  LIDODERM Place 2 patches onto the skin daily.   METAMUCIL PO  Take by mouth.   metFORMIN 1000 MG tablet Commonly known as:  GLUCOPHAGE Take 1 tablet (1,000 mg total) by mouth 2 (two) times daily with a meal.   Methotrexate (PF) 20 MG/0.4ML Soaj Inject into the skin. Once a week   MYRBETRIQ 50 MG Tb24 tablet Generic drug:  mirabegron ER   nortriptyline 10 MG capsule Commonly known as:  PAMELOR Take 1 capsule (10 mg total) by mouth at bedtime.   ONE TOUCH ULTRA SYSTEM KIT w/Device Kit Use device to check blood sugar.   predniSONE 10 MG tablet Commonly known as:  DELTASONE 3 tabs by mouth daily x 3 days, then 2 tabs by mouth daily x 2 days then 1 tab by mouth daily x 2 days   PROCTOSOL HC 2.5 % rectal cream Generic drug:  hydrocortisone Reported on 01/26/2015   Pseudoephedrine HCl ([place]) 30 MG Taba Take 1 tablet by mouth as needed. Reported on 01/26/2015   rizatriptan 10 MG disintegrating tablet Commonly known as:  MAXALT-MLT Take 1 tablet PO at onset of migraine- may repeat in two hours if needed.   simvastatin 20 MG tablet Commonly known as:  ZOCOR Take 1 tablet (20 mg total) by mouth at bedtime.   tamsulosin 0.4 MG Caps capsule Commonly known as:  FLOMAX Take 0.4 mg by mouth daily after supper.   topiramate 100 MG tablet Commonly known as:  TOPAMAX Take 1 tablet (100 mg total) by mouth 2 (two) times daily.   verapamil 120 MG CR tablet Commonly known as:  CALAN-SR Take 1 tablet (120 mg total) by mouth at bedtime.   XIIDRA 5 % Soln Generic drug:  Lifitegrast Apply to eye.          Objective:   Physical  Exam BP 126/66 (BP Location: Right Arm, Patient Position: Sitting, Cuff Size: Small)   Pulse (!) 102   Temp 97.6 F (36.4 C) (Oral)   Resp 14   Ht 5' 4"  (1.626 m)   Wt 137 lb 6 oz (62.3 kg)   SpO2 98%   BMI 23.58 kg/m  General:   Well developed, well nourished . NAD.  HEENT:  Normocephalic . Face symmetric, atraumatic. TMs normal. Sinuses no TTP, nose is slightly congested. Right nostril without evidence of bleeding. Left nostril: Medial aspect with erythema but no active bleeding.  Lungs:  CTA B Normal respiratory effort, no intercostal retractions, no accessory muscle use. Heart: RRR,  no murmur.  No pretibial edema bilaterally  Skin: Not pale. Not jaundice Neurologic:  alert & oriented X3.  Speech normal, gait appropriate for age and unassisted Psych--  Cognition and judgment appear intact.  Cooperative with normal attention span and concentration.  Behavior appropriate. No anxious or depressed appearing.      Assessment & Plan:    Assessment  DM 2012 Hyperlipidemia zocor  GERD Abnormal gallbladder per CT-- Saw surgery 12-16: No surgery rx MSK: ---Pain mngmt: reports a severe allergic reaction to  NSAIDs-codeine-MSO4 ; can tolerate oxycodone but the pain relief is just mild ; intolerant to Cymbalta 10-2015 ---Rheumatoid arthritis -- on enbrel - MTX (Dr Elpidio Galea) ---DJD multiple sites, cervical spondylosis -- gabapentin ---CTS ---neuropathy on-off, hands R>L Headaches --topamax GU: sees urology --Elevated PSA --BPH Skin cancer, BCC  PLAN: HAs: Great response to verapamil, sx are much less frequent/intense, has use lidocaine twice with good results Epistaxis: New issue, no chronic sxs, onset 2 weeks ago, improving with conservative treatment .  Sinusitis? Sinus congestion slightly worse than baseline for few  days, doubt acute bacterial sinusitis. Recommend saline nasal spray and Flonase. Addendum: Patient concerned b/c  he takes immunosuppressant, I agree that  he is a high risk pt,  I sent a Rx for penicillin, to use if not improving in the next 2 or 3 days. Anemia: H/o anemia, a year ago iron was checked, was slt low, was recommended to see GI but that never happened. Plan-- CBC, iron, ferritin, folic acid, U92 and refer to GI. Hemorrhoids: On and off problem for years, was quite symptomatic a couple of weeks ago but that has resolved. RTC set for 04-2016, CPX

## 2016-01-29 NOTE — Progress Notes (Signed)
Pre visit review using our clinic review tool, if applicable. No additional management support is needed unless otherwise documented below in the visit note. 

## 2016-01-30 ENCOUNTER — Telehealth: Payer: Self-pay | Admitting: Internal Medicine

## 2016-01-30 NOTE — Telephone Encounter (Signed)
Spoke w/ Pt, informed him of below.

## 2016-01-30 NOTE — Telephone Encounter (Signed)
Okay to switch from gabapentin to Lyrica, no objections

## 2016-01-30 NOTE — Telephone Encounter (Signed)
Relation to [id number] Call back [phone]   Reason for call:  Patient states Dr. Posey Pronto wanted him to ask PCP what he thinks about d/c gabapentin (NEURONTIN) 300 MG capsule and trying lyrica, please advise

## 2016-01-30 NOTE — Telephone Encounter (Signed)
FYI

## 2016-02-01 ENCOUNTER — Ambulatory Visit: Payer: BLUE CROSS/BLUE SHIELD | Admitting: Adult Health

## 2016-02-06 ENCOUNTER — Encounter: Payer: Self-pay | Admitting: Adult Health

## 2016-02-06 ENCOUNTER — Encounter: Payer: Self-pay | Admitting: Gastroenterology

## 2016-02-06 ENCOUNTER — Ambulatory Visit (INDEPENDENT_AMBULATORY_CARE_PROVIDER_SITE_OTHER): Payer: BLUE CROSS/BLUE SHIELD | Admitting: Adult Health

## 2016-02-06 ENCOUNTER — Ambulatory Visit (INDEPENDENT_AMBULATORY_CARE_PROVIDER_SITE_OTHER): Payer: BLUE CROSS/BLUE SHIELD | Admitting: Gastroenterology

## 2016-02-06 VITALS — BP 116/66 | HR 91 | Ht 64.0 in | Wt 130.4 lb

## 2016-02-06 VITALS — BP 110/70 | HR 96 | Ht 64.0 in | Wt 131.4 lb

## 2016-02-06 DIAGNOSIS — R51 Headache: Secondary | ICD-10-CM

## 2016-02-06 DIAGNOSIS — D509 Iron deficiency anemia, unspecified: Secondary | ICD-10-CM

## 2016-02-06 DIAGNOSIS — G8929 Other chronic pain: Secondary | ICD-10-CM

## 2016-02-06 DIAGNOSIS — K625 Hemorrhage of anus and rectum: Secondary | ICD-10-CM | POA: Diagnosis not present

## 2016-02-06 DIAGNOSIS — D500 Iron deficiency anemia secondary to blood loss (chronic): Secondary | ICD-10-CM | POA: Insufficient documentation

## 2016-02-06 DIAGNOSIS — R519 Headache, unspecified: Secondary | ICD-10-CM

## 2016-02-06 MED ORDER — NA SULFATE-K SULFATE-MG SULF 17.5-3.13-1.6 GM/177ML PO SOLN: 1.0000 | ORAL | 0 refills | Status: DC

## 2016-02-06 MED ORDER — PROCTOSOL HC 2.5 % RE CREA: TOPICAL_CREAM | Freq: Two times a day (BID) | RECTAL | 2 refills | Status: AC

## 2016-02-06 NOTE — Patient Instructions (Signed)
Continue Topamax and maxalt Continue Verapamil  If you continue to do well then we will wean off Topamax  If your symptoms worsen or you develop new symptoms please let us know.

## 2016-02-06 NOTE — Progress Notes (Signed)
I have read the note, and I agree with the clinical assessment and plan.  Annesha Delgreco KEITH   

## 2016-02-06 NOTE — Progress Notes (Signed)
02/06/2016 HAYVEN FATIMA [id number] 07-01-1945   HISTORY OF PRESENT ILLNESS:  This is a 71 year old male who is previously known to Dr. Deatra Ina, his care will be assumed by Dr. Havery Moros in his absence. He has long-standing issues with rectal bleeding. His last colonoscopy was in October 2002 at which time his found have just diverticulosis. He was due for another colonoscopy in 2012, but says that he had a lot of other issues with orthopedic surgeries, etc. that he needed to address so never proceeded with that.  He was seen by myself in 11/2014 at which time he was scheduled for a colonoscopy but never proceeded with that one either.  He is here now with recurrent complaints of rectal bleeding on and off.  Hgb is 11.5 grams, but has been 11.5-12 grams for the 1.5 years.  Iron studies borderline low with ferritin 21 and serum iron 65.   Past Medical History:  Diagnosis Date  . Allergic rhinitis   . Asthma    "I outgrew it"  . Carpal tunnel syndrome   . Chronic rheumatic arthritis ([place]) 01/20/2014   Per Dr. Amil Amen   . Complete tear of rotator cuff 2016   Right, MRI in 2016, complete rupture of long head of the biceps as well as a tear of the subscapularis and supraspinatus tendons, Dr. Mardelle Matte  . Complication of anesthesia   . Constipation   . Diabetes mellitus    2012  . Elevated PSA   . GERD (gastroesophageal reflux disease)   . Headache(784.0)    headaches-related to sinuses & uses Tylenol   . Hemorrhoids   . Hernia   . Hyperlipidemia   . Nocturia   . Numbness of fingers   . Osteoarthritis    ankle- left, hands & neck & hip  . PONV (postoperative nausea and vomiting)    WITH ETHER  . Skin cancer, basal cell    sees derm   Past Surgical History:  Procedure Laterality Date  . ANKLE FUSION Left 03-19-13   Dr Mardelle Matte  . ANKLE SURGERY Left   . APPENDECTOMY     1964  . BACK SURGERY     cerv. fusion C1- T1, 2 separate surgeries , T1-T3 2014  . CARPAL TUNNEL RELEASE  Right   . CARPAL TUNNEL RELEASE Left 07-14-2014   [facility]  . CYSTOSCOPY     multiple times for infections  . INGUINAL HERNIA REPAIR  03/27/2011   Procedure: HERNIA REPAIR INGUINAL ADULT;  Surgeon: Harl Bowie, MD;  Location: [facility];  Service: General;  Laterality: Left;  . INGUINAL HERNIA REPAIR Bilateral 12/09/2012   Procedure: LAPAROSCOPIC BILATERAL INGUINAL HERNIA REPAIR WITH MESH;  Surgeon: Harl Bowie, MD;  Location: [facility];  Service: General;  Laterality: Bilateral;  . INSERTION OF MESH Bilateral 12/09/2012   Procedure: INSERTION OF MESH;  Surgeon: Harl Bowie, MD;  Location: [facility];  Service: General;  Laterality: Bilateral;  . left ankle mass removed  1972   3 surgeries on L ankle- 73, 2008, 2009  . NECK SURGERY  03/11,    C3-C7  . RECTAL SURGERY  at birth   "[place]" SURGICAL CORRECTION  . ROTATOR CUFF REPAIR W/ DISTAL CLAVICLE EXCISION Right 08/25/2014   And acromioplasty, Dr. Mardelle Matte  . SHOULDER SURGERY  09/17/12  . TONSILLECTOMY    . VARICOCELECTOMY      reports that he has been smoking Cigarettes.  He has been smoking about 5.00 packs  per day. He has never used smokeless tobacco. He reports that he does not drink alcohol or use drugs. family history includes Anesthesia problems in his mother; Atrial fibrillation in his sister and sister; Breast cancer in his sister; Cerebral aneurysm in his father; Diabetes in his sister; Emphysema in his sister; Lung cancer in his sister and sister; Skin cancer in his father; Stroke in his father and mother. Allergies  Allergen Reactions  . Aspirin Anaphylaxis  . Codeine Phosphate Anaphylaxis  . Ibuprofen Anaphylaxis  . Levofloxacin Itching and Anaphylaxis  . Menthol Swelling  . Monosodium Glutamate Diarrhea and Nausea And Vomiting    Other reaction(s): Cramps (ALLERGY/intolerance)  . Morphine Sulfate Anaphylaxis  . Naproxen Sodium Anaphylaxis  . Nsaids Anaphylaxis  . Other Itching    **BAND-AID**  .  Sulfamethoxazole Anaphylaxis  . Cymbalta [Duloxetine Hcl] Other (See Comments)    Dizziness, syncope, and sleepiness       Outpatient Encounter Prescriptions as of 02/06/2016  Medication Sig  . acetaminophen (TYLENOL) 500 MG tablet Take 500-1,000 mg by mouth every 4 (four) hours as needed. For pain  . albuterol (VENTOLIN HFA) 108 (90 BASE) MCG/ACT inhaler Inhale 2 puffs into the lungs every 6 (six) hours as needed for wheezing or shortness of breath.  Marland Kitchen amoxicillin (AMOXIL) 875 MG tablet Take 1 tablet (875 mg total) by mouth 2 (two) times daily.  . Blood Glucose Monitoring Suppl (ONE TOUCH ULTRA SYSTEM KIT) W/DEVICE KIT Use device to check blood sugar.  . diphenhydrAMINE (BENADRYL) 25 MG tablet Take 25 mg by mouth every 6 (six) hours as needed.  Marland Kitchen DOCUSATE SODIUM PO Take by mouth. Reported on 01/26/2015  . esomeprazole (NEXIUM) 10 MG packet Take 10 mg by mouth daily before breakfast.  . etanercept (ENBREL) 50 MG/ML injection Inject 50 mg into the skin once a week.  . fish oil-omega-3 fatty acids 1000 MG capsule Take 1 g by mouth 2 (two) times daily.   . fluticasone (FLONASE) 50 MCG/ACT nasal spray Place 1 spray into both nostrils daily.  . folic acid (FOLVITE) 1 MG tablet Take 1 mg by mouth daily.  Marland Kitchen gabapentin (NEURONTIN) 300 MG capsule Take 1 capsule (300 mg total) by mouth 4 (four) times daily. (Patient taking differently: Take 300 mg by mouth 5 (five) times daily. )  . glucose blood (ACCU-CHEK SMARTVIEW) test strip Reported on 05/24/2015  . glucose blood (FREESTYLE LITE) test strip Check blood sugar no more than twice daily.  . hydrocortisone 2.5 % cream Apply 1 application topically 2 (two) times daily. Reported on 01/26/2015  . ketoconazole (NIZORAL) 2 % cream Apply 1 application topically daily.  Marland Kitchen lidocaine (LIDODERM) 5 % Place 2 patches onto the skin daily.  . Lidocaine HCl (ASPERCREME LIDOCAINE) 4 % LIQD Apply topically.  Marland Kitchen Lifitegrast (XIIDRA) 5 % SOLN Apply to eye.  . Menthol,  Topical Analgesic, (BIOFREEZE ROLL-ON EX) Apply topically.  . metFORMIN (GLUCOPHAGE) 1000 MG tablet Take 1 tablet (1,000 mg total) by mouth 2 (two) times daily with a meal.  . Methotrexate, PF, 20 MG/0.4ML SOAJ Inject into the skin. Once a week  . MYRBETRIQ 50 MG TB24 tablet   . predniSONE (DELTASONE) 10 MG tablet 3 tabs by mouth daily x 3 days, then 2 tabs by mouth daily x 2 days then 1 tab by mouth daily x 2 days  . PROCTOSOL HC 2.5 % rectal cream Place rectally 2 (two) times daily. Reported on 01/26/2015  . Pseudoephedrine HCl, Deter, 30 MG TABA Take  1 tablet by mouth as needed. Reported on 01/26/2015  . Psyllium (METAMUCIL PO) Take by mouth.  . Simethicone (GAS-X PO) Take by mouth. Reported on 01/26/2015  . simvastatin (ZOCOR) 20 MG tablet Take 1 tablet (20 mg total) by mouth at bedtime.  . Tamsulosin HCl (FLOMAX) 0.4 MG CAPS Take 0.4 mg by mouth daily after supper.   . topiramate (TOPAMAX) 100 MG tablet Take 1 tablet (100 mg total) by mouth 2 (two) times daily.  . verapamil (CALAN-SR) 120 MG CR tablet Take 1 tablet (120 mg total) by mouth at bedtime.  . [DISCONTINUED] PROCTOSOL HC 2.5 % rectal cream Reported on 01/26/2015  . Na Sulfate-K Sulfate-Mg Sulf (SUPREP BOWEL PREP KIT) 17.5-3.13-1.6 GM/180ML SOLN Take 1 kit by mouth as directed.   No facility-administered encounter medications on file as of 02/06/2016.      REVIEW OF SYSTEMS  : All other systems reviewed and negative except where noted in the History of Present Illness.   PHYSICAL EXAM: BP 110/70   Pulse 96   Ht 5' 4"  (1.626 m)   Wt 131 lb 6.4 oz (59.6 kg)   SpO2 99%   BMI 22.55 kg/m  General: Well developed white male in no acute distress Head: Normocephalic and atraumatic Eyes:  Sclerae anicteric, conjunctiva pink. Ears: Normal auditory acuity Lungs: Clear throughout to auscultation Heart: Regular rate and rhythm Abdomen: Soft, non-distended.  Normal bowel sounds.  Non-tender. Rectal:  Will be done at the time of  colonoscopy. Musculoskeletal: Symmetrical with no gross deformities  Skin: No lesions on visible extremities Extremities: No edema  Neurological: Alert oriented x 4, grossly non-focal Psychological:  Alert and cooperative. Normal mood and affect  ASSESSMENT AND PLAN: -71 year old male with borderline IDA and chronic rectal bleeding:  Has failed to proceed with colonoscopy on a few occasions.  Will schedule colonoscopy and will perform EGD as well.  The risks, benefits, and alternatives to EGD and colonoscopy were discussed with the patient and he consents to proceed.  Will refill his hemorrhoid cream per his request.  CC:  Colon Branch, MD

## 2016-02-06 NOTE — Progress Notes (Signed)
PATIENT: Christian Roberts DOB: November 26, 1945  REASON FOR VISIT: follow up- headache HISTORY FROM: patient  HISTORY OF PRESENT ILLNESS: Christian Roberts is a 71 year old male with a history of left-sided headaches. He returns today for follow-up. He reports that his primary care placed him on verapamil. He states that since he started this medication his headaches have almost resolved. He states that there are days he feels as if he may get a headache but it never comes. He states that if he does get a headache he uses lidocaine nasal spray with good benefit. He continues to use Topamax and Maxalt. Patient advised that with his left-sided headaches he was also having lacrimation in the left eye and nasal congestion on the left side. The patient had never reported this previously. He denies any new neurological symptoms. He returns today for an evaluation.  HISTORY 11/02/15: Christian Roberts is a 71 year old male with a history of left-sided headaches. He returns today for follow-up. He reports that he has about 2 weeks out of a month that he has a headache off and on. He states that when the headache starts he does take Maxalt and it resolves fairly quickly however the next day be headache returns. He states that he only takes Maxalt 2 days in a row and then will not take any additional tablets as he does not want a rebound headache. He states the headache is always located on the left side of the head. He has mild photophobia but denies phonophobia. He does have nausea but no vomiting. He was placed on Cymbalta by his primary care however he reports he is not able to tolerate this. He is on Topamax 200 mg daily. He returns today for an evaluation.   REVIEW OF SYSTEMS: Out of a complete 14 system review of symptoms, the patient complains only of the following symptoms, and all other reviewed systems are negative.  Headache  ALLERGIES: Allergies  Allergen Reactions  . Aspirin Anaphylaxis  . Codeine  Phosphate Anaphylaxis  . Ibuprofen Anaphylaxis  . Levofloxacin Itching and Anaphylaxis  . Menthol Swelling  . Monosodium Glutamate Diarrhea and Nausea And Vomiting    Other reaction(s): Cramps (ALLERGY/intolerance)  . Morphine Sulfate Anaphylaxis  . Naproxen Sodium Anaphylaxis  . Nsaids Anaphylaxis  . Other Itching    **BAND-AID**  . Sulfamethoxazole Anaphylaxis  . Cymbalta [Duloxetine Hcl] Other (See Comments)    Dizziness, syncope, and sleepiness     HOME MEDICATIONS: Outpatient Medications Prior to Visit  Medication Sig Dispense Refill  . acetaminophen (TYLENOL) 500 MG tablet Take 500-1,000 mg by mouth every 4 (four) hours as needed. For pain    . albuterol (VENTOLIN HFA) 108 (90 BASE) MCG/ACT inhaler Inhale 2 puffs into the lungs every 6 (six) hours as needed for wheezing or shortness of breath. 1 Inhaler 1  . amoxicillin (AMOXIL) 875 MG tablet Take 1 tablet (875 mg total) by mouth 2 (two) times daily. 20 tablet 0  . Blood Glucose Monitoring Suppl (ONE TOUCH ULTRA SYSTEM KIT) W/DEVICE KIT Use device to check blood sugar. 1 each 0  . diphenhydrAMINE (BENADRYL) 25 MG tablet Take 25 mg by mouth every 6 (six) hours as needed.    Marland Kitchen DOCUSATE SODIUM PO Take by mouth. Reported on 01/26/2015    . esomeprazole (NEXIUM) 10 MG packet Take 10 mg by mouth daily before breakfast.    . etanercept (ENBREL) 50 MG/ML injection Inject 50 mg into the skin once a week.    Marland Kitchen  fish oil-omega-3 fatty acids 1000 MG capsule Take 1 g by mouth 2 (two) times daily.     . fluticasone (FLONASE) 50 MCG/ACT nasal spray Place 1 spray into both nostrils daily.    . folic acid (FOLVITE) 1 MG tablet Take 1 mg by mouth daily.    Marland Kitchen gabapentin (NEURONTIN) 300 MG capsule Take 1 capsule (300 mg total) by mouth 4 (four) times daily. (Patient taking differently: Take 300 mg by mouth 5 (five) times daily. ) 450 capsule 1  . glucose blood (ACCU-CHEK SMARTVIEW) test strip Reported on 05/24/2015    . glucose blood (FREESTYLE LITE)  test strip Check blood sugar no more than twice daily. 100 each 12  . hydrocortisone 2.5 % cream Apply 1 application topically 2 (two) times daily. Reported on 01/26/2015    . ketoconazole (NIZORAL) 2 % cream Apply 1 application topically daily. 30 g 1  . lidocaine (LIDODERM) 5 % Place 2 patches onto the skin daily. 90 patch 3  . Lidocaine HCl (ASPERCREME LIDOCAINE) 4 % LIQD Apply topically.    Marland Kitchen Lifitegrast (XIIDRA) 5 % SOLN Apply to eye.    . Menthol, Topical Analgesic, (BIOFREEZE ROLL-ON EX) Apply topically.    . metFORMIN (GLUCOPHAGE) 1000 MG tablet Take 1 tablet (1,000 mg total) by mouth 2 (two) times daily with a meal. 180 tablet 1  . Methotrexate, PF, 20 MG/0.4ML SOAJ Inject into the skin. Once a week    . MYRBETRIQ 50 MG TB24 tablet     . predniSONE (DELTASONE) 10 MG tablet 3 tabs by mouth daily x 3 days, then 2 tabs by mouth daily x 2 days then 1 tab by mouth daily x 2 days 15 tablet 0  . PROCTOSOL HC 2.5 % rectal cream Reported on 01/26/2015    . Pseudoephedrine HCl, Deter, 30 MG TABA Take 1 tablet by mouth as needed. Reported on 01/26/2015    . Psyllium (METAMUCIL PO) Take by mouth.    . Simethicone (GAS-X PO) Take by mouth. Reported on 01/26/2015    . simvastatin (ZOCOR) 20 MG tablet Take 1 tablet (20 mg total) by mouth at bedtime. 90 tablet 1  . Tamsulosin HCl (FLOMAX) 0.4 MG CAPS Take 0.4 mg by mouth daily after supper.     . topiramate (TOPAMAX) 100 MG tablet Take 1 tablet (100 mg total) by mouth 2 (two) times daily. 180 tablet 1  . verapamil (CALAN-SR) 120 MG CR tablet Take 1 tablet (120 mg total) by mouth at bedtime. 30 tablet 1  . Lancets (FREESTYLE) lancets Check blood sugars no more than twice daily. (Patient not taking: Reported on 01/29/2016) 100 each 12  . nortriptyline (PAMELOR) 10 MG capsule Take 1 capsule (10 mg total) by mouth at bedtime. 90 capsule 3  . rizatriptan (MAXALT-MLT) 10 MG disintegrating tablet Take 1 tablet PO at onset of migraine- may repeat in two hours if  needed. 27 tablet 3   No facility-administered medications prior to visit.     PAST MEDICAL HISTORY: Past Medical History:  Diagnosis Date  . Allergic rhinitis   . Asthma    "I outgrew it"  . Carpal tunnel syndrome   . Chronic rheumatic arthritis ([place]) 01/20/2014   Per Dr. Amil Amen   . Complete tear of rotator cuff 2016   Right, MRI in 2016, complete rupture of long head of the biceps as well as a tear of the subscapularis and supraspinatus tendons, Dr. Mardelle Matte  . Complication of anesthesia   . Constipation   .  Diabetes mellitus    2012  . Elevated PSA   . GERD (gastroesophageal reflux disease)   . Headache(784.0)    headaches-related to sinuses & uses Tylenol   . Hemorrhoids   . Hernia   . Hyperlipidemia   . Nocturia   . Numbness of fingers   . Osteoarthritis    ankle- left, hands & neck & hip  . PONV (postoperative nausea and vomiting)    WITH ETHER  . Skin cancer, basal cell    sees derm    PAST SURGICAL HISTORY: Past Surgical History:  Procedure Laterality Date  . ANKLE FUSION Left 03-19-13   Dr Mardelle Matte  . ANKLE SURGERY Left   . APPENDECTOMY     1964  . BACK SURGERY     cerv. fusion C1- T1, 2 separate surgeries , T1-T3 2014  . CARPAL TUNNEL RELEASE Right   . CARPAL TUNNEL RELEASE Left 07-14-2014   [facility]  . CYSTOSCOPY     multiple times for infections  . INGUINAL HERNIA REPAIR  03/27/2011   Procedure: HERNIA REPAIR INGUINAL ADULT;  Surgeon: Harl Bowie, MD;  Location: [facility];  Service: General;  Laterality: Left;  . INGUINAL HERNIA REPAIR Bilateral 12/09/2012   Procedure: LAPAROSCOPIC BILATERAL INGUINAL HERNIA REPAIR WITH MESH;  Surgeon: Harl Bowie, MD;  Location[facility];  Service: General;  Laterality: Bilateral;  . INSERTION OF MESH Bilateral 12/09/2012   Procedure: INSERTION OF MESH;  Surgeon: Harl Bowie, MD;  [facility];  Service: General;  Laterality: Bilateral;  . left ankle mass removed  1972   3 surgeries on L ankle- 73, 2008,  2009  . NECK SURGERY  03/11,    C3-C7  . RECTAL SURGERY  at birth   "[place]" SURGICAL CORRECTION  . ROTATOR CUFF REPAIR W/ DISTAL CLAVICLE EXCISION Right 08/25/2014   And acromioplasty, Dr. Mardelle Matte  . SHOULDER SURGERY  09/17/12  . TONSILLECTOMY    . VARICOCELECTOMY      FAMILY HISTORY: Family History  Problem Relation Age of Onset  . Stroke Mother   . Anesthesia problems Mother   . Cerebral aneurysm Father   . Skin cancer Father     melanoma  . Stroke Father   . Diabetes Sister     hypoglycemia  . Breast cancer Sister     x 2  . Lung cancer Sister   . Atrial fibrillation Sister   . Lung cancer Sister   . Emphysema Sister   . Atrial fibrillation Sister   . Colon cancer Neg Hx   . Prostate cancer Neg Hx     SOCIAL HISTORY: Social History   Social History  . Marital status: Married    Spouse name: Arbie Cookey  . Number of children: 3  . Years of education: BA+   Occupational History  . 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 Smoking status: Current Every Day Smoker    Packs/day: 5.00    Types: Cigarettes    Last attempt to quit: 04/10/2012  . Smokeless tobacco: Never Used     Comment: quit on-off since 2014   . Alcohol use No  . Drug use: No  . Sexual activity: Not Currently   Other Topics Concern  . Not on file   Social History Narrative   Married , lives w/ wife   Right-handed   Rare caffeine intake         PHYSICAL EXAM  Vitals:  02/06/16 0752  BP: 116/66  Pulse: 91  Weight: 130 lb 6.4 oz (59.1 kg)  Height: _0  (1.626 m)   Body mass index is 22.38 kg/m.  Generalized: Well developed, in no acute distress   Neurological examination  Mentation: Alert oriented to time, place, history taking. Follows all commands speech and language fluent Cranial nerve II-XII: Pupils were equal round reactive to light. Extraocular movements were full, visual field were full on confrontational test. Facial sensation and strength  were normal. Uvula tongue midline. Head turning and shoulder shrug  were normal and symmetric. Motor: The motor testing reveals 5 over 5 strength of all 4 extremities. Good symmetric motor tone is noted throughout.  Sensory: Sensory testing is intact to soft touch on all 4 extremities. No evidence of extinction is noted.  Coordination: Cerebellar testing reveals good finger-nose-finger and heel-to-shin bilaterally.  Gait and station: Gait is normal. Tandem gait is normal. Romberg is negative. No drift is seen.  Reflexes: Deep tendon reflexes are symmetric and normal bilaterally.   DIAGNOSTIC DATA (LABS, IMAGING, TESTING) - I reviewed patient records, labs, notes, testing and imaging myself where available.  Lab Results  Component Value Date   WBC 9.9 01/29/2016   HGB 11.5 (L) 01/29/2016   HCT 34.4 (L) 01/29/2016   MCV 90.3 01/29/2016   PLT 386.0 01/29/2016      Component Value Date/Time   NA 142 12/19/2015 1444   NA 146 10/20/2014   K 4.9 12/19/2015 1444   CL 109 12/19/2015 1444   CO2 26 12/19/2015 1444   GLUCOSE 130 (H) 12/19/2015 1444   BUN 18 12/19/2015 1444   BUN 13 10/20/2014   CREATININE 0.92 12/19/2015 1444   CALCIUM 9.7 12/19/2015 1444   PROT 6.9 03/08/2013 0842   ALBUMIN 3.9 03/08/2013 0842   AST 16 10/20/2014   ALT 16 10/20/2014   ALKPHOS 72 10/20/2014   BILITOT 0.7 03/08/2013 0842   GFRNONAA 68 (L) 01/06/2014 1045   GFRAA 79 (L) 01/06/2014 1045   Lab Results  Component Value Date   CHOL 137 11/23/2015   HDL 45.30 11/23/2015   LDLCALC 76 11/23/2015   LDLDIRECT 145.3 12/23/2011   TRIG 77.0 11/23/2015   CHOLHDL 3 11/23/2015   Lab Results  Component Value Date   HGBA1C 6.9 (H) 11/23/2015   Lab Results  Component Value Date   VITAMINB12 304 01/29/2016   Lab Results  Component Value Date   TSH 1.07 11/23/2015      ASSESSMENT AND PLAN 71 y.o. year old male  has a past medical history of Allergic rhinitis; Asthma; Carpal tunnel syndrome; Chronic  rheumatic arthritis ([place]) (01/20/2014); Complete tear of rotator cuff (2016); Complication of anesthesia; Constipation; Diabetes mellitus; Elevated PSA; GERD (gastroesophageal reflux disease); Headache(784.0); Hemorrhoids; Hernia; Hyperlipidemia; Nocturia; Numbness of fingers; Osteoarthritis; PONV (postoperative nausea and vomiting); and Skin cancer, basal cell. here with:  1. Migraine headache  Overall the patient has responded well to verapamil. He will continue on Topamax and Maxalt at this time. However if he continues to do well on verapamil in the future we may consider slowly weaning the patient off of Topamax. Patient voiced understanding. He will follow-up in 6 months or sooner if needed.  I spent 15 minutes with the patient 50% of this time was spent reviewing his diagnoses and medication management.   Ward Givens, MSN, NP-C 02/06/2016, 8:34 AM [facility] [address], [place], [place] [address] [phone]

## 2016-02-06 NOTE — Patient Instructions (Signed)

## 2016-02-07 ENCOUNTER — Other Ambulatory Visit: Payer: Self-pay | Admitting: Emergency Medicine

## 2016-02-07 ENCOUNTER — Telehealth: Payer: Self-pay | Admitting: Gastroenterology

## 2016-02-07 ENCOUNTER — Encounter: Payer: Self-pay | Admitting: Gastroenterology

## 2016-02-07 NOTE — Telephone Encounter (Signed)
Dr. Havery Moros,  Yes. I will call him.

## 2016-02-07 NOTE — Telephone Encounter (Signed)
LMOM to call back

## 2016-02-07 NOTE — Telephone Encounter (Signed)
Oncall Note Patient called at 10:30 PM to report that he has sulpha allergy and the prep he was given had sulphates. I explained to patient that the mechanism is different with prep as compared to sulpha in antibiotics but given he had anaphylactic reaction with sulpha per chart review, should consider switching prep. I tried to explain to patient but did appear he could understand the change in prep instructions with Miralax and Gatorade. Advised him to call back office in AM to get new prep and instructions. He is scheduled for colonoscopy on Feb 1st

## 2016-02-07 NOTE — Progress Notes (Signed)
Agree with assessment and plan as outlined.  

## 2016-02-07 NOTE — Telephone Encounter (Signed)
Spoke to patient and he is adamant that he does not want suprep. I told him that the alternative is miralax and gatorade prep. He is ok with that and will come to office and pick up instructions.

## 2016-02-07 NOTE — Telephone Encounter (Signed)
Thanks Idelia Salm or Cyril Mourning would you mind contacting this patient to clarify? thanks

## 2016-02-07 NOTE — Telephone Encounter (Addendum)
Sent email to patients wife, she called and said she did receive them. Went over instructions with wife and she verbalized understanding.

## 2016-02-08 ENCOUNTER — Encounter: Payer: Self-pay | Admitting: Gastroenterology

## 2016-02-08 ENCOUNTER — Ambulatory Visit (AMBULATORY_SURGERY_CENTER): Payer: BLUE CROSS/BLUE SHIELD | Admitting: Gastroenterology

## 2016-02-08 VITALS — BP 124/75 | HR 91 | Temp 98.6°F | Resp 16 | Ht 64.0 in | Wt 131.0 lb

## 2016-02-08 DIAGNOSIS — D120 Benign neoplasm of cecum: Secondary | ICD-10-CM | POA: Diagnosis not present

## 2016-02-08 DIAGNOSIS — D125 Benign neoplasm of sigmoid colon: Secondary | ICD-10-CM

## 2016-02-08 DIAGNOSIS — K621 Rectal polyp: Secondary | ICD-10-CM

## 2016-02-08 DIAGNOSIS — K2950 Unspecified chronic gastritis without bleeding: Secondary | ICD-10-CM | POA: Diagnosis not present

## 2016-02-08 DIAGNOSIS — D123 Benign neoplasm of transverse colon: Secondary | ICD-10-CM

## 2016-02-08 DIAGNOSIS — D126 Benign neoplasm of colon, unspecified: Secondary | ICD-10-CM | POA: Diagnosis not present

## 2016-02-08 DIAGNOSIS — D128 Benign neoplasm of rectum: Secondary | ICD-10-CM

## 2016-02-08 DIAGNOSIS — D509 Iron deficiency anemia, unspecified: Secondary | ICD-10-CM

## 2016-02-08 DIAGNOSIS — K3189 Other diseases of stomach and duodenum: Secondary | ICD-10-CM | POA: Diagnosis not present

## 2016-02-08 DIAGNOSIS — D129 Benign neoplasm of anus and anal canal: Principal | ICD-10-CM

## 2016-02-08 DIAGNOSIS — D122 Benign neoplasm of ascending colon: Secondary | ICD-10-CM

## 2016-02-08 DIAGNOSIS — K635 Polyp of colon: Secondary | ICD-10-CM

## 2016-02-08 DIAGNOSIS — D124 Benign neoplasm of descending colon: Secondary | ICD-10-CM

## 2016-02-08 DIAGNOSIS — K625 Hemorrhage of anus and rectum: Secondary | ICD-10-CM | POA: Diagnosis not present

## 2016-02-08 LAB — GLUCOSE, CAPILLARY
GLUCOSE-CAPILLARY: 80 mg/dL (ref 65–99)
Glucose-Capillary: 93 mg/dL (ref 65–99)

## 2016-02-08 MED ORDER — SODIUM CHLORIDE 0.9 % IV SOLN: 500.0000 mL | INTRAVENOUS | Status: DC

## 2016-02-08 MED ORDER — FERROUS SULFATE 325 (65 FE) MG PO TABS: 325.0000 mg | ORAL_TABLET | Freq: Two times a day (BID) | ORAL | 0 refills | Status: DC

## 2016-02-08 NOTE — Progress Notes (Signed)
Report to PACU, RN, vss, BBS= Clear.  

## 2016-02-08 NOTE — Op Note (Signed)
[place] Patient Name: Christian Roberts Procedure Date: 02/08/2016 1:51 PM MRN: [id number] Endoscopist: Remo Lipps P. Ayuub Penley MD, MD Age: 71 Referring MD:  Date of Birth: January 09, 1945 Gender: Male Account #: [id number] Procedure:                Upper GI endoscopy Indications:              Iron deficiency anemia Medicines:                Monitored Anesthesia Care Procedure:                Pre-Anesthesia Assessment:                           - Prior to the procedure, a History and Physical                            was performed, and patient medications and                            allergies were reviewed. The patient's tolerance of                            previous anesthesia was also reviewed. The risks                            and benefits of the procedure and the sedation                            options and risks were discussed with the patient.                            All questions were answered, and informed consent                            was obtained. Prior Anticoagulants: The patient has                            taken no previous anticoagulant or antiplatelet                            agents. ASA Grade Assessment: III - A patient with                            severe systemic disease. After reviewing the risks                            and benefits, the patient was deemed in                            satisfactory condition to undergo the procedure.                           After obtaining informed consent, the endoscope was  passed under direct vision. Throughout the                            procedure, the patient's blood pressure, pulse, and                            oxygen saturations were monitored continuously. The                            Model GIF-HQ190 [id number]) scope was introduced                            through the mouth, and advanced to the second part                            of duodenum. The upper  GI endoscopy was                            accomplished without difficulty. The patient                            tolerated the procedure well. Scope In: Scope Out: Findings:                 Esophagogastric landmarks were identified: the                            Z-line was found at 38 cm, the gastroesophageal                            junction was found at 38 cm and the upper extent of                            the gastric folds was found at 38 cm from the                            incisors.                           The exam of the esophagus was otherwise normal.                           Diffuse mildly nodular mucosa was found in the                            gastric body - may be normal variant, otherwise                            appeared normal. Biopsies were taken with a cold                            forceps for histology.                           The exam  of the stomach was otherwise normal.                           The duodenal bulb and second portion of the                            duodenum were normal. Complications:            No immediate complications. Estimated blood loss:                            Minimal. Estimated Blood Loss:     Estimated blood loss was minimal. Impression:               - Esophagogastric landmarks identified.                           - Normal esophagus                           - Nodular mucosa in the gastric body, could be                            normal variant. Biopsied.                           - Normal duodenal bulb and second portion of the                            duodenum. Recommendation:           - Patient has a contact number available for                            emergencies. The signs and symptoms of potential                            delayed complications were discussed with the                            patient. Return to normal activities tomorrow.                            Written discharge instructions  were provided to the                            patient.                           - Resume previous diet.                           - Continue present medications.                           - Await pathology results. Remo Lipps P. Claudie Brickhouse MD, MD 02/08/2016 3:19:31 PM This report has been signed electronically.

## 2016-02-08 NOTE — Progress Notes (Signed)
Called to room to assist during endoscopic procedure.  Patient ID and intended procedure confirmed with present staff. Received instructions for my participation in the procedure from the performing physician.  

## 2016-02-08 NOTE — Patient Instructions (Signed)
YOU HAD AN ENDOSCOPIC PROCEDURE TODAY AT [facility]:   Refer to the procedure report that was given to you for any specific questions about what was found during the examination.  If the procedure report does not answer your questions, please call your gastroenterologist to clarify.  If you requested that your care partner not be given the details of your procedure findings, then the procedure report has been included in a sealed envelope for you to review at your convenience later.  YOU SHOULD EXPECT: Some feelings of bloating in the abdomen. Passage of more gas than usual.  Walking can help get rid of the air that was put into your GI tract during the procedure and reduce the bloating. If you had a lower endoscopy (such as a colonoscopy or flexible sigmoidoscopy) you may notice spotting of blood in your stool or on the toilet paper. If you underwent a bowel prep for your procedure, you may not have a normal bowel movement for a few days.  Please Note:  You might notice some irritation and congestion in your nose or some drainage.  This is from the oxygen used during your procedure.  There is no need for concern and it should clear up in a day or so.  SYMPTOMS TO REPORT IMMEDIATELY:   Following lower endoscopy (colonoscopy or flexible sigmoidoscopy):  Excessive amounts of blood in the stool  Significant tenderness or worsening of abdominal pains  Swelling of the abdomen that is new, acute  Fever of 100F or higher   Following upper endoscopy (EGD)  Vomiting of blood or coffee ground material  New chest pain or pain under the shoulder blades  Painful or persistently difficult swallowing  New shortness of breath  Fever of 100F or higher  Black, tarry-looking stools  For urgent or emergent issues, a gastroenterologist can be reached at any hour by calling [phone].   DIET:  We do recommend a small meal at first, but then you may proceed to your regular diet.  Drink  plenty of fluids but you should avoid alcoholic beverages for 24 hours.  ACTIVITY:  You should plan to take it easy for the rest of today and you should NOT DRIVE or use heavy machinery until tomorrow (because of the sedation medicines used during the test).    FOLLOW UP: Our staff will call the number listed on your records the next business day following your procedure to check on you and address any questions or concerns that you may have regarding the information given to you following your procedure. If we do not reach you, we will leave a message.  However, if you are feeling well and you are not experiencing any problems, there is no need to return our call.  We will assume that you have returned to your regular daily activities without incident.  If any biopsies were taken you will be contacted by phone or by letter within the next 1-3 weeks.  Please call us at [phone] if you have not heard about the biopsies in 3 weeks.    SIGNATURES/CONFIDENTIALITY: You and/or your care partner have signed paperwork which will be entered into your electronic medical record.  These signatures attest to the fact that that the information above on your After Visit Summary has been reviewed and is understood.  Full responsibility of the confidentiality of this discharge information lies with you and/or your care-partner.    INFORMATION ON POLYPS,DIVERTICULOSIS,AND HEMORRHOIDS GIVEN TO YOU TODAY  2 CLIPS WERE PLACED IN YOUR COLON BY DR Havery Moros ASSESSMENT: A CARD WAS GIVEN TO YOU TODAY TO INDICATE THIS AND CARRY IN YOUR WALLET IN CASE YOU HAVE ANY RADIOLOGY PROCEDURES DONE IN THE NEAR FUTURE  AWAIT PATHOLOGY RESULTS     FERROUS SULFATE 325MG  P.O TWICE DAILY SENT TO YOUR PHARMACY FOR YOU TO PICK UP (ORDERED BY DR Havery Moros)

## 2016-02-08 NOTE — Op Note (Signed)
[place] Patient Name: Christian Roberts Procedure Date: 02/08/2016 1:50 PM MRN: [id number] Endoscopist: Remo Lipps P. Armbruster MD, MD Age: 71 Referring MD:  Date of Birth: May 06, 1945 Gender: Male Account #: [id number] Procedure:                Colonoscopy Indications:              Hematochezia, Iron deficiency anemia Medicines:                Monitored Anesthesia Care Procedure:                Pre-Anesthesia Assessment:                           - Prior to the procedure, a History and Physical                            was performed, and patient medications and                            allergies were reviewed. The patient's tolerance of                            previous anesthesia was also reviewed. The risks                            and benefits of the procedure and the sedation                            options and risks were discussed with the patient.                            All questions were answered, and informed consent                            was obtained. Prior Anticoagulants: The patient has                            taken no previous anticoagulant or antiplatelet                            agents. ASA Grade Assessment: III - A patient with                            severe systemic disease. After reviewing the risks                            and benefits, the patient was deemed in                            satisfactory condition to undergo the procedure.                           After obtaining informed consent, the colonoscope  was passed under direct vision. Throughout the                            procedure, the patient's blood pressure, pulse, and                            oxygen saturations were monitored continuously. The                            Model CF-HQ190L [id number]) scope was introduced                            through the anus and advanced to the the terminal                            ileum, with  identification of the appendiceal                            orifice and IC valve. The colonoscopy was performed                            without difficulty. The patient tolerated the                            procedure well. The quality of the bowel                            preparation was adequate. The terminal ileum,                            ileocecal valve, appendiceal orifice, and rectum                            were photographed. Scope In: 2:07:46 PM Scope Out: 3:03:20 PM Scope Withdrawal Time: 0 hours 49 minutes 23 seconds  Total Procedure Duration: 0 hours 55 minutes 34 seconds  Findings:                 The perianal and digital rectal examinations were                            normal.                           The terminal ileum appeared normal.                           A 6 mm polyp was found in the cecum. The polyp was                            sessile. The polyp was removed with a cold snare.                            Resection and retrieval were complete.  A 20 to 25 mm polyp was found in the ascending                            colon. The polyp was sessile. Area was successfully                            injected with saline for a lift polypectomy. The                            polyp was removed with a hot snare in 2 passes.                            Resection and retrieval were complete. Area was                            tattooed with an injection of Spot (carbon black).                            To prevent bleeding after the polypectomy, two                            hemostatic clips were successfully placed. There                            was no bleeding during, or at the end, of the                            procedure.                           Six sessile polyps were found in the ascending                            colon. The polyps were 4 to 10 mm in size. These                            polyps were removed with a cold  snare. Resection                            and retrieval were complete.                           Three sessile polyps were found in the transverse                            colon. The polyps were 6 to 10 mm in size. These                            polyps were removed with a cold snare. Resection                            and retrieval were complete.  A 6 mm polyp was found in the splenic flexure. The                            polyp was sessile. The polyp was removed with a                            cold snare. Resection and retrieval were complete.                           A 6 mm polyp was found in the descending colon. The                            polyp was sessile. The polyp was removed with a                            cold snare. Resection and retrieval were complete.                           A 5 mm polyp was found in the sigmoid colon. The                            polyp was sessile. The polyp was removed with a                            cold snare. Resection and retrieval were complete.                           A 8 mm polyp was found in the sigmoid colon. The                            polyp was semi-pedunculated. The polyp was removed                            with a hot snare. Resection and retrieval were                            complete.                           Multiple flat polyps were found in the rectum and                            recto-sigmoid colon. The polyps were medium in                            size. Biopsies were taken with a cold forceps for                            histology, suspect hyperplastic.                           Multiple medium-mouthed diverticula were found in  the left colon.                           Internal hemorrhoids were found during retroflexion.                           The exam was otherwise without abnormality. Of                            note, I thought a photo of  the IC valve was taken                            but it was not. This procedure was prolonged due to                            fair prep on insertion which took time to clear the                            colon, as well as significant looping encountered                            during this procedure and inability to retain air                            in the left colon. Complications:            No immediate complications. Estimated blood loss:                            Minimal. Estimated Blood Loss:     Estimated blood loss was minimal. Impression:               - The examined portion of the ileum was normal.                           - One 6 mm polyp in the cecum, removed with a cold                            snare. Resected and retrieved.                           - One 20 to 25 mm polyp in the ascending colon,                            removed with a hot snare. Resected and retrieved.                            Injected. Tattooed. Clips were placed.                           - Six 4 to 10 mm polyps in the ascending colon,  removed with a cold snare. Resected and retrieved.                           - Three 6 to 10 mm polyps in the transverse colon,                            removed with a cold snare. Resected and retrieved.                           - One 6 mm polyp at the splenic flexure, removed                            with a cold snare. Resected and retrieved.                           - One 6 mm polyp in the descending colon, removed                            with a cold snare. Resected and retrieved.                           - One 5 mm polyp in the sigmoid colon, removed with                            a cold snare. Resected and retrieved.                           - One 8 mm polyp in the sigmoid colon, removed with                            a hot snare. Resected and retrieved.                           - Multiple medium polyps in the  rectum and at the                            recto-sigmoid colon. Biopsied.                           - Diverticulosis in the left colon.                           - Internal hemorrhoids.                           - The examination was otherwise normal.                           Overall, suspect hemorrhoids is causing bleeding,                            no pathology noted to cause anemia. Recommendation:           - Patient has a  contact number available for                            emergencies. The signs and symptoms of potential                            delayed complications were discussed with the                            patient. Return to normal activities tomorrow.                            Written discharge instructions were provided to the                            patient.                           - Resume previous diet.                           - Continue present medications.                           - No ibuprofen, naproxen, or other non-steroidal                            anti-inflammatory drugs for 2 weeks after polyp                            removal.                           - Await pathology results.                           - Repeat colonoscopy is recommended for                            surveillance. The colonoscopy date will be                            determined after pathology results from today's                            exam become available for review. Remo Lipps P. Armbruster MD, MD 02/08/2016 3:16:18 PM This report has been signed electronically.

## 2016-02-08 NOTE — Progress Notes (Signed)
Patient's blood sugar taken, 80. Asymptomatic.informed Dr. Wilma Flavin, [facility] Monday CRNA and Rush Barer RN(Recovery nurse) no orders received.

## 2016-02-09 ENCOUNTER — Telehealth: Payer: Self-pay

## 2016-02-09 NOTE — Telephone Encounter (Signed)
  Follow up Call-  Call back number 02/08/2016  Post procedure Call Back phone  # [phone]  Permission to leave phone message Yes  Some recent data might be hidden     Patient questions:  Do you have a fever, pain , or abdominal swelling? No. Pain Score  0 *  Have you tolerated food without any problems? Yes.    Have you been able to return to your normal activities? Yes.    Do you have any questions about your discharge instructions: Diet   No. Medications  No. Follow up visit  No.  Do you have questions or concerns about your Care? No.  Actions: * If pain score is 4 or above: No action needed, pain <4.

## 2016-02-10 ENCOUNTER — Encounter (HOSPITAL_COMMUNITY): Payer: Self-pay | Admitting: Emergency Medicine

## 2016-02-10 ENCOUNTER — Inpatient Hospital Stay (HOSPITAL_COMMUNITY)
Admission: EM | Admit: 2016-02-10 | Discharge: 2016-02-11 | DRG: 920 | Disposition: A | Discharge status: Home / Self Care | Payer: BLUE CROSS/BLUE SHIELD | Attending: Internal Medicine | Admitting: Internal Medicine

## 2016-02-10 DIAGNOSIS — D62 Acute posthemorrhagic anemia: Secondary | ICD-10-CM | POA: Diagnosis present

## 2016-02-10 DIAGNOSIS — K640 First degree hemorrhoids: Secondary | ICD-10-CM | POA: Diagnosis present

## 2016-02-10 DIAGNOSIS — R519 Other chronic pain: Secondary | ICD-10-CM | POA: Diagnosis present

## 2016-02-10 DIAGNOSIS — R51 Headache: Secondary | ICD-10-CM | POA: Diagnosis present

## 2016-02-10 DIAGNOSIS — K633 Ulcer of intestine: Secondary | ICD-10-CM | POA: Diagnosis not present

## 2016-02-10 DIAGNOSIS — Z981 Arthrodesis status: Secondary | ICD-10-CM | POA: Diagnosis not present

## 2016-02-10 DIAGNOSIS — M069 Rheumatoid arthritis, unspecified: Secondary | ICD-10-CM | POA: Diagnosis present

## 2016-02-10 DIAGNOSIS — Z833 Family history of diabetes mellitus: Secondary | ICD-10-CM | POA: Diagnosis not present

## 2016-02-10 DIAGNOSIS — Z9889 Other specified postprocedural states: Secondary | ICD-10-CM

## 2016-02-10 DIAGNOSIS — D122 Benign neoplasm of ascending colon: Secondary | ICD-10-CM | POA: Diagnosis present

## 2016-02-10 DIAGNOSIS — K922 Gastrointestinal hemorrhage, unspecified: Secondary | ICD-10-CM | POA: Diagnosis not present

## 2016-02-10 DIAGNOSIS — G8929 Other chronic pain: Secondary | ICD-10-CM | POA: Diagnosis present

## 2016-02-10 DIAGNOSIS — K625 Hemorrhage of anus and rectum: Secondary | ICD-10-CM | POA: Diagnosis not present

## 2016-02-10 DIAGNOSIS — K219 Gastro-esophageal reflux disease without esophagitis: Secondary | ICD-10-CM | POA: Diagnosis present

## 2016-02-10 DIAGNOSIS — K5730 Diverticulosis of large intestine without perforation or abscess without bleeding: Secondary | ICD-10-CM | POA: Diagnosis present

## 2016-02-10 DIAGNOSIS — D500 Iron deficiency anemia secondary to blood loss (chronic): Secondary | ICD-10-CM | POA: Diagnosis present

## 2016-02-10 DIAGNOSIS — E119 Type 2 diabetes mellitus without complications: Secondary | ICD-10-CM | POA: Diagnosis present

## 2016-02-10 DIAGNOSIS — Z79899 Other long term (current) drug therapy: Secondary | ICD-10-CM | POA: Diagnosis not present

## 2016-02-10 DIAGNOSIS — K91840 Postprocedural hemorrhage and hematoma of a digestive system organ or structure following a digestive system procedure: Secondary | ICD-10-CM | POA: Diagnosis not present

## 2016-02-10 DIAGNOSIS — D509 Iron deficiency anemia, unspecified: Secondary | ICD-10-CM

## 2016-02-10 DIAGNOSIS — Y838 Other surgical procedures as the cause of abnormal reaction of the patient, or of later complication, without mention of misadventure at the time of the procedure: Secondary | ICD-10-CM | POA: Diagnosis present

## 2016-02-10 DIAGNOSIS — K921 Melena: Secondary | ICD-10-CM | POA: Diagnosis not present

## 2016-02-10 LAB — PREPARE RBC (CROSSMATCH)

## 2016-02-10 LAB — PROTIME-INR
INR: 1.12
Prothrombin Time: 14.5 seconds (ref 11.4–15.2)

## 2016-02-10 LAB — CBC WITH DIFFERENTIAL/PLATELET
BASOS ABS: 0 10*3/uL (ref 0.0–0.1)
BASOS PCT: 0 %
EOS PCT: 3 %
Eosinophils Absolute: 0.2 10*3/uL (ref 0.0–0.7)
HCT: 26.1 % — ABNORMAL LOW (ref 39.0–52.0)
Hemoglobin: 8.5 g/dL — ABNORMAL LOW (ref 13.0–17.0)
Lymphocytes Relative: 23 %
Lymphs Abs: 2.1 10*3/uL (ref 0.7–4.0)
MCH: 29.9 pg (ref 26.0–34.0)
MCHC: 32.6 g/dL (ref 30.0–36.0)
MCV: 91.9 fL (ref 78.0–100.0)
MONO ABS: 0.9 10*3/uL (ref 0.1–1.0)
Monocytes Relative: 10 %
Neutro Abs: 5.9 10*3/uL (ref 1.7–7.7)
Neutrophils Relative %: 64 %
PLATELETS: 255 10*3/uL (ref 150–400)
RBC: 2.84 MIL/uL — ABNORMAL LOW (ref 4.22–5.81)
RDW: 15.6 % — AB (ref 11.5–15.5)
WBC: 9.2 10*3/uL (ref 4.0–10.5)

## 2016-02-10 LAB — GLUCOSE, CAPILLARY
GLUCOSE-CAPILLARY: 130 mg/dL — AB (ref 65–99)
GLUCOSE-CAPILLARY: 107 mg/dL — AB (ref 65–99)
GLUCOSE-CAPILLARY: 108 mg/dL — AB (ref 65–99)
Glucose-Capillary: 123 mg/dL — ABNORMAL HIGH (ref 65–99)
Glucose-Capillary: 93 mg/dL (ref 65–99)

## 2016-02-10 LAB — BASIC METABOLIC PANEL
Anion gap: 9 (ref 5–15)
BUN: 20 mg/dL (ref 6–20)
CALCIUM: 8.6 mg/dL — AB (ref 8.9–10.3)
CO2: 21 mmol/L — ABNORMAL LOW (ref 22–32)
CREATININE: 0.86 mg/dL (ref 0.61–1.24)
Chloride: 111 mmol/L (ref 101–111)
GFR calc Af Amer: >60 mL/min (ref >60)
GLUCOSE: 144 mg/dL — AB (ref 65–99)
Potassium: 3.9 mmol/L (ref 3.5–5.1)
Sodium: 141 mmol/L (ref 135–145)

## 2016-02-10 LAB — MRSA PCR SCREENING: MRSA by PCR: POSITIVE — AB

## 2016-02-10 LAB — ABO/RH: ABO/RH(D): A POS

## 2016-02-10 LAB — I-STAT CG4 LACTIC ACID, ED: Lactic Acid, Venous: 1.26 mmol/L (ref 0.5–1.9)

## 2016-02-10 MED ORDER — MIRABEGRON ER 50 MG PO TB24
50.0000 mg | ORAL_TABLET | Freq: Every day | ORAL | Status: DC
  Administered 2016-02-10: 50 mg via ORAL
  Filled 2016-02-10 (×2): qty 1

## 2016-02-10 MED ORDER — SODIUM CHLORIDE 0.9 % IV SOLN
10.0000 mL/h | Freq: Once | INTRAVENOUS | Status: AC
  Administered 2016-02-10 (×2): 10 mL/h via INTRAVENOUS

## 2016-02-10 MED ORDER — PANTOPRAZOLE SODIUM 40 MG IV SOLR
40.0000 mg | Freq: Two times a day (BID) | INTRAVENOUS | Status: DC
  Administered 2016-02-10 (×2): 40 mg via INTRAVENOUS
  Filled 2016-02-10 (×2): qty 40

## 2016-02-10 MED ORDER — FLUTICASONE PROPIONATE 50 MCG/ACT NA SUSP
1.0000 | Freq: Every day | NASAL | Status: DC
  Administered 2016-02-10 – 2016-02-11 (×2): 1 via NASAL
  Filled 2016-02-10: qty 16

## 2016-02-10 MED ORDER — ATORVASTATIN CALCIUM 10 MG PO TABS
10.0000 mg | ORAL_TABLET | Freq: Every day | ORAL | Status: DC
  Administered 2016-02-10: 10 mg via ORAL
  Filled 2016-02-10: qty 1

## 2016-02-10 MED ORDER — SODIUM CHLORIDE 0.9 % IV SOLN: Freq: Once | INTRAVENOUS | Status: AC

## 2016-02-10 MED ORDER — MUPIROCIN 2 % EX OINT
1.0000 | TOPICAL_OINTMENT | Freq: Two times a day (BID) | CUTANEOUS | Status: DC
Start: 2016-02-10 — End: 2016-02-11
  Administered 2016-02-10 – 2016-02-11 (×3): 1 via NASAL
  Filled 2016-02-10: qty 22

## 2016-02-10 MED ORDER — TAMSULOSIN HCL 0.4 MG PO CAPS
0.4000 mg | ORAL_CAPSULE | Freq: Every day | ORAL | Status: DC
  Administered 2016-02-10 (×2): 0.4 mg via ORAL
  Filled 2016-02-10 (×2): qty 1

## 2016-02-10 MED ORDER — INSULIN ASPART 100 UNIT/ML ~~LOC~~ SOLN
0.0000 [IU] | SUBCUTANEOUS | Status: DC
Start: 2016-02-10 — End: 2016-02-11
  Administered 2016-02-10 (×2): 1 [IU] via SUBCUTANEOUS
  Administered 2016-02-11: 3 [IU] via SUBCUTANEOUS

## 2016-02-10 MED ORDER — DIPHENHYDRAMINE HCL 25 MG PO CAPS: 25.0000 mg | ORAL_CAPSULE | Freq: Four times a day (QID) | ORAL | Status: DC | PRN

## 2016-02-10 MED ORDER — VERAPAMIL HCL ER 120 MG PO TBCR
120.0000 mg | EXTENDED_RELEASE_TABLET | Freq: Every day | ORAL | Status: DC
  Administered 2016-02-10: 120 mg via ORAL
  Filled 2016-02-10 (×2): qty 1

## 2016-02-10 MED ORDER — TOPIRAMATE 100 MG PO TABS
100.0000 mg | ORAL_TABLET | ORAL | Status: DC
  Administered 2016-02-10 (×2): 100 mg via ORAL
  Filled 2016-02-10 (×2): qty 1

## 2016-02-10 MED ORDER — ACETAMINOPHEN 325 MG PO TABS
650.0000 mg | ORAL_TABLET | Freq: Four times a day (QID) | ORAL | Status: DC | PRN
  Administered 2016-02-10 – 2016-02-11 (×4): 650 mg via ORAL
  Filled 2016-02-10 (×4): qty 2

## 2016-02-10 MED ORDER — OMEGA-3-ACID ETHYL ESTERS 1 G PO CAPS
1.0000 g | ORAL_CAPSULE | Freq: Two times a day (BID) | ORAL | Status: DC
  Administered 2016-02-10 (×2): 1 g via ORAL
  Filled 2016-02-10 (×4): qty 1

## 2016-02-10 MED ORDER — MIRABEGRON ER 50 MG PO TB24
50.0000 mg | ORAL_TABLET | Freq: Every day | ORAL | Status: DC
  Filled 2016-02-10: qty 1

## 2016-02-10 MED ORDER — SODIUM CHLORIDE 0.9% FLUSH
3.0000 mL | Freq: Two times a day (BID) | INTRAVENOUS | Status: DC
  Administered 2016-02-11: 3 mL via INTRAVENOUS

## 2016-02-10 MED ORDER — PEG-KCL-NACL-NASULF-NA ASC-C 100 G PO SOLR
1.0000 | Freq: Once | ORAL | Status: DC
  Filled 2016-02-10: qty 1

## 2016-02-10 MED ORDER — SODIUM CHLORIDE 0.9 % IV BOLUS (SEPSIS)
1000.0000 mL | Freq: Once | INTRAVENOUS | Status: AC
  Administered 2016-02-10: 1000 mL via INTRAVENOUS

## 2016-02-10 MED ORDER — POLYETHYLENE GLYCOL 3350 17 GM/SCOOP PO POWD
0.5000 | Freq: Two times a day (BID) | ORAL | Status: AC
  Administered 2016-02-10 – 2016-02-11 (×2): 127.5 g via ORAL
  Filled 2016-02-10: qty 255

## 2016-02-10 MED ORDER — SODIUM CHLORIDE 0.9 % IV SOLN
INTRAVENOUS | Status: AC
  Administered 2016-02-10: 06:00:00 via INTRAVENOUS

## 2016-02-10 MED ORDER — GABAPENTIN 300 MG PO CAPS
300.0000 mg | ORAL_CAPSULE | Freq: Four times a day (QID) | ORAL | Status: DC
  Administered 2016-02-10 – 2016-02-11 (×6): 300 mg via ORAL
  Filled 2016-02-10 (×6): qty 1

## 2016-02-10 MED ORDER — ONDANSETRON HCL 4 MG PO TABS: 4.0000 mg | ORAL_TABLET | Freq: Four times a day (QID) | ORAL | Status: DC | PRN

## 2016-02-10 MED ORDER — SODIUM CHLORIDE 0.9 % IV SOLN
INTRAVENOUS | Status: DC
  Administered 2016-02-10 – 2016-02-11 (×2): via INTRAVENOUS

## 2016-02-10 MED ORDER — SIMVASTATIN 20 MG PO TABS: 20.0000 mg | ORAL_TABLET | Freq: Every day | ORAL | Status: DC

## 2016-02-10 MED ORDER — LIFITEGRAST 5 % OP SOLN: 1.0000 "application " | Freq: Every day | OPHTHALMIC | Status: DC

## 2016-02-10 MED ORDER — ACETAMINOPHEN 650 MG RE SUPP: 650.0000 mg | Freq: Four times a day (QID) | RECTAL | Status: DC | PRN

## 2016-02-10 MED ORDER — CHLORHEXIDINE GLUCONATE CLOTH 2 % EX PADS
6.0000 | MEDICATED_PAD | Freq: Every day | CUTANEOUS | Status: DC
  Administered 2016-02-10 – 2016-02-11 (×2): 6 via TOPICAL

## 2016-02-10 MED ORDER — HYDROCORTISONE 1 % EX CREA
1.0000 "application " | TOPICAL_CREAM | Freq: Every day | CUTANEOUS | Status: DC | PRN
  Filled 2016-02-10: qty 28

## 2016-02-10 MED ORDER — ONDANSETRON HCL 4 MG/2ML IJ SOLN: 4.0000 mg | Freq: Four times a day (QID) | INTRAMUSCULAR | Status: DC | PRN

## 2016-02-10 MED ORDER — FOLIC ACID 1 MG PO TABS
1.0000 mg | ORAL_TABLET | Freq: Every day | ORAL | Status: DC
  Administered 2016-02-10 – 2016-02-11 (×2): 1 mg via ORAL
  Filled 2016-02-10 (×2): qty 1

## 2016-02-10 MED ORDER — TOPIRAMATE 100 MG PO TABS: 100.0000 mg | ORAL_TABLET | Freq: Two times a day (BID) | ORAL | Status: DC

## 2016-02-10 NOTE — Progress Notes (Signed)
Progress note  Patient admitted early this morning. See H&P. Patient continues to have hematochezia. GI has been consulted, planning for repeat colonoscopy this afternoon. Patient admits to some dizziness but no chest pain, no shortness of breath, also has some lower abdominal pain.  Currently being transfused 1 unit packed red blood cells Continue to monitor CBC GI planning for colonoscopy this PM   Dessa Phi, DO [facility] [email or web address] Password [email or web address] 02/10/2016, 11:32 AM

## 2016-02-10 NOTE — Progress Notes (Signed)
The order for simvastatin(Zocor) was changed to an equivalent dose of atorvastatin(Lipitor) due to the potential drug interaction with Verapamil.  When taken in combination with medications that inhibit its metabolism, simvastatin can accumulate which increases the risk of liver toxicity, myopathy, or rhabdomyolysis.  Simvastatin dose should not exceed 10mg /day in patients taking verapamil, diltiazem, fibrates, or niacin >or= 1g/day.   Simvastatin dose should not exceed 20mg /day in patients taking amlodipine, ranolazine or amiodarone.   Please consider this potential interaction at discharge.  Christian Roberts A 02/10/2016 4:02 PM

## 2016-02-10 NOTE — Consult Note (Signed)
Referring Provider: [facility]  Primary Care Physician:  Kathlene November, MD Primary Gastroenterologist: Steely Hollow Cellar, MD  Reason for Consultation:  Hematochezia, presumed post-polypectomy bleed    Attending physician's note   I have taken a history, examined the patient and reviewed the chart. I agree with the Advanced Practitioner's note, impression and recommendations. Acute LGI bleed, likely a post polypectomy bleed from the 20-25 mm ascending colon polyp removed by snare with cautery which was tattooed and prophyllactically clipped. Diverticular bleed or bleed from other cold snare polypectomy sites possible but unlikely. ABL anemia on chronic iron deficiency anemia. Transfusions to maintain Hb > 8. Attempted to prep today with Moviprep for colonoscopy later today however pt did not tolerate the taste of Moviprep and declined to take. Will change to Miralax in Gatorade prep and schedule colonoscopy for 0800 tomorrow.   Christian Edward, MD [facility] [phone] Mon-Fri 8a-5p [phone] after 5p, weekends, holidays    ASSESSMENT AND PLAN:   1. Postpolypectomy bleed. Patient is hemodynamically stable. Last bloody BM less than an hour ago, dark red per RN. Patient will be scheduled for a repeat colonoscopy for control of bleeding. The risks and benefits of the procedure were discussed and the patient agrees to proceed.   2. ABL superimposed on chronic anemia. Hgb 8.5 down from baseline of 11.5. Getting a unit of blood now  3. Chronic headaches.On Topamax   HPI: Christian Roberts is a 71 y.o. male who underwent EGD / colonoscopy 2 days ago for evaluation of iron deficiency anemia. EGD was unrevealing. Multiple polyps removed  Multiple polyps were removed, including an 86m sessile ascending colon polyp which was lifted then removed by hot snare. For precautionary measures 2 clips were placed and tatoo placed. Patient began having hematochezia during the night. He has mild associated LLQ  discomfort. No dizziness or SOB. He does have a headache, not unusual for him   Past Medical History:  Diagnosis Date  . Allergic rhinitis   . Asthma    "I outgrew it"  . Carpal tunnel syndrome   . [place]   Per Dr. BAmil Amen  . Complete tear of rotator cuff 2016   Right, MRI in 2016, complete rupture of long head of the biceps as well as a tear of the subscapularis and supraspinatus tendons, Dr. LMardelle Matte . Complication of anesthesia   . Constipation   . Diabetes mellitus    2012  . Elevated PSA   . GERD (gastroesophageal reflux disease)   . Headache(784.0)    headaches-related to sinuses & uses Tylenol   . Hemorrhoids   . Hernia   . Hyperlipidemia   . Nocturia   . Numbness of fingers   . Osteoarthritis    ankle- left, hands & neck & hip  . PONV (postoperative nausea and vomiting)    WITH ETHER  . Skin cancer, basal cell    sees derm    Past Surgical History:  Procedure Laterality Date  . ANKLE FUSION Left 03-19-13   Dr LMardelle Matte . ANKLE SURGERY Left   . APPENDECTOMY     1964  . BACK SURGERY     cerv. fusion C1- T1, 2 separate surgeries , T1-T3 2014  . CARPAL TUNNEL RELEASE Right   . CARPAL TUNNEL RELEASE Left 07-14-2014   [place]  . CYSTOSCOPY     multiple times for infections  . INGUINAL HERNIA REPAIR  03/27/2011   Procedure: HERNIA REPAIR INGUINAL ADULT;  Surgeon:  Harl Bowie, MD;  Location: [place];  Service: General;  Laterality: Left;  . INGUINAL HERNIA REPAIR Bilateral 12/09/2012   Procedure: LAPAROSCOPIC BILATERAL INGUINAL HERNIA REPAIR WITH MESH;  Surgeon: Harl Bowie, MD;  Location: [facility];  Service: General;  Laterality: Bilateral;  . INSERTION OF MESH Bilateral 12/09/2012   Procedure: INSERTION OF MESH;  Surgeon: Harl Bowie, MD;  Location: [facility];  Service: General;  Laterality: Bilateral;  . left ankle mass removed  1972   3 surgeries on L ankle- 73, 2008, 2009  . NECK SURGERY  03/11,    C3-C7  .  RECTAL SURGERY  at birth   "[place]" SURGICAL CORRECTION  . ROTATOR CUFF REPAIR W/ DISTAL CLAVICLE EXCISION Right 08/25/2014   And acromioplasty, Dr. Mardelle Matte  . SHOULDER SURGERY  09/17/12  . TONSILLECTOMY    . VARICOCELECTOMY      Prior to Admission medications   Medication Sig Start Date End Date Taking? Authorizing Provider  acetaminophen (TYLENOL) 500 MG tablet Take 500-1,000 mg by mouth every 4 (four) hours as needed. For pain   Yes Historical Provider, MD  albuterol (VENTOLIN HFA) 108 (90 BASE) MCG/ACT inhaler Inhale 2 puffs into the lungs every 6 (six) hours as needed for wheezing or shortness of breath. 03/29/14  Yes Colon Branch, MD  diphenhydrAMINE (BENADRYL) 25 MG tablet Take 25 mg by mouth every 6 (six) hours as needed for allergies.    Yes Historical Provider, MD  DOCUSATE SODIUM PO Take 100 mg by mouth daily as needed (constipation). Reported on 01/26/2015   Yes Historical Provider, MD  esomeprazole (NEXIUM) 10 MG packet Take 10 mg by mouth daily before breakfast.   Yes Historical Provider, MD  etanercept (ENBREL) 50 MG/ML injection Inject 50 mg into the skin once a week.   Yes Historical Provider, MD  ferrous sulfate 325 (65 FE) MG tablet Take 1 tablet (325 mg total) by mouth 2 (two) times daily with a meal. 02/08/16 03/24/16 Yes Manus Gunning, MD  fish oil-omega-3 fatty acids 1000 MG capsule Take 1 g by mouth 2 (two) times daily.    Yes Historical Provider, MD  fluticasone (FLONASE) 50 MCG/ACT nasal spray Place 1 spray into both nostrils daily.   Yes Historical Provider, MD  folic acid (FOLVITE) 1 MG tablet Take 1 mg by mouth daily.   Yes Historical Provider, MD  gabapentin (NEURONTIN) 300 MG capsule Take 1 capsule (300 mg total) by mouth 4 (four) times daily. Patient taking differently: Take 300 mg by mouth 5 (five) times daily.  06/12/15  Yes Colon Branch, MD  hydrocortisone 2.5 % cream Apply 1 application topically daily as needed (itching). Reported on 01/26/2015    Yes Historical Provider, MD  ketoconazole (NIZORAL) 2 % cream Apply 1 application topically daily. 10/18/15  Yes Colon Branch, MD  lidocaine (LIDODERM) 5 % Place 2 patches onto the skin daily. Patient taking differently: Place 2 patches onto the skin daily as needed (pain).  05/24/15  Yes Colon Branch, MD  Lifitegrast Shirley Friar) 5 % SOLN Apply 1 application to eye daily.    Yes Historical Provider, MD  Menthol, Topical Analgesic, (BIOFREEZE ROLL-ON EX) Apply 1 application topically daily as needed (pain).    Yes Historical Provider, MD  metFORMIN (GLUCOPHAGE) 1000 MG tablet Take 1 tablet (1,000 mg total) by mouth 2 (two) times daily with a meal. 09/20/15  Yes Colon Branch, MD  Methotrexate, PF, 20 MG/0.4ML SOAJ Inject  into the skin. Every Thursday   Yes Historical Provider, MD  MYRBETRIQ 50 MG TB24 tablet Take 50 mg by mouth daily.  07/05/13  Yes Historical Provider, MD  Psyllium (METAMUCIL PO) Take 1 tablet by mouth daily.    Yes Historical Provider, MD  Simethicone (GAS-X PO) Take 1 tablet by mouth 3 (three) times daily as needed (gas). Reported on 01/26/2015   Yes Historical Provider, MD  simvastatin (ZOCOR) 20 MG tablet Take 1 tablet (20 mg total) by mouth at bedtime. 11/06/15  Yes Colon Branch, MD  Tamsulosin HCl (FLOMAX) 0.4 MG CAPS Take 0.4 mg by mouth daily after supper.    Yes Historical Provider, MD  topiramate (TOPAMAX) 100 MG tablet Take 1 tablet (100 mg total) by mouth 2 (two) times daily. 11/06/15  Yes Ward Givens, NP  verapamil (CALAN-SR) 120 MG CR tablet Take 1 tablet (120 mg total) by mouth at bedtime. 12/19/15  Yes Colon Branch, MD  Blood Glucose Monitoring Suppl (ONE TOUCH ULTRA SYSTEM KIT) W/DEVICE KIT Use device to check blood sugar. 08/25/14   Colon Branch, MD  glucose blood (FREESTYLE LITE) test strip Check blood sugar no more than twice daily. 08/09/14   Colon Branch, MD  predniSONE (DELTASONE) 10 MG tablet 3 tabs by mouth daily x 3 days, then 2 tabs by mouth daily x 2 days then 1 tab by mouth  daily x 2 days Patient not taking: Reported on 02/10/2016 01/27/16   Amy E Diona Browner, MD  PROCTOSOL HC 2.5 % rectal cream Place rectally 2 (two) times daily. Reported on 01/26/2015 Patient not taking: Reported on 02/08/2016 02/06/16   Laban Emperor Zehr, PA-C    Current Facility-Administered Medications  Medication Dose Route Frequency Provider Last Rate Last Dose  . 0.9 %  sodium chloride infusion   Intravenous Continuous Vianne Bulls, MD 125 mL/hr at 02/10/16 0615    . acetaminophen (TYLENOL) tablet 650 mg  650 mg Oral Q6H PRN Vianne Bulls, MD   650 mg at 02/10/16 2992   Or  . acetaminophen (TYLENOL) suppository 650 mg  650 mg Rectal Q6H PRN Vianne Bulls, MD      . diphenhydrAMINE (BENADRYL) capsule 25 mg  25 mg Oral Q6H PRN Vianne Bulls, MD      . fluticasone (FLONASE) 50 MCG/ACT nasal spray 1 spray  1 spray Each Nare Daily Ilene Qua Opyd, MD      . folic acid (FOLVITE) tablet 1 mg  1 mg Oral Daily Ilene Qua Opyd, MD      . gabapentin (NEURONTIN) capsule 300 mg  300 mg Oral QID Ilene Qua Opyd, MD   300 mg at 02/10/16 0816  . hydrocortisone cream 1 % 1 application  1 application Topical Daily PRN Ilene Qua Opyd, MD      . insulin aspart (novoLOG) injection 0-9 Units  0-9 Units Subcutaneous Q4H Vianne Bulls, MD   1 Units at 02/10/16 [phone]  . Lifitegrast 5 % SOLN 1 application  1 application Ophthalmic Daily Ilene Qua Opyd, MD      . mirabegron ER (MYRBETRIQ) tablet 50 mg  50 mg Oral Daily Timothy S Opyd, MD      . omega-3 acid ethyl esters (LOVAZA) capsule 1 g  1 g Oral BID Ilene Qua Opyd, MD      . ondansetron (ZOFRAN) tablet 4 mg  4 mg Oral Q6H PRN Vianne Bulls, MD       Or  .  ondansetron (ZOFRAN) injection 4 mg  4 mg Intravenous Q6H PRN Ilene Qua Opyd, MD      . pantoprazole (PROTONIX) injection 40 mg  40 mg Intravenous Q12H Vianne Bulls, MD   40 mg at 02/10/16 0817  . simvastatin (ZOCOR) tablet 20 mg  20 mg Oral QHS Timothy S Opyd, MD      . sodium chloride flush (NS) 0.9 % injection 3  mL  3 mL Intravenous Q12H Timothy S Opyd, MD      . tamsulosin (FLOMAX) capsule 0.4 mg  0.4 mg Oral QPC supper Vianne Bulls, MD      . topiramate (TOPAMAX) tablet 100 mg  100 mg Oral BID Ilene Qua Opyd, MD      . verapamil (CALAN-SR) CR tablet 120 mg  120 mg Oral QHS Vianne Bulls, MD        Allergies as of 02/10/2016 - Review Complete 02/10/2016  Allergen Reaction Noted  . Aspirin Anaphylaxis 12/25/2005  . Codeine phosphate Anaphylaxis 12/25/2005  . Ibuprofen Anaphylaxis 12/25/2005  . Levofloxacin Itching and Anaphylaxis 12/25/2005  . Menthol Swelling 03/09/2014  . Monosodium glutamate Diarrhea and Nausea And Vomiting 03/09/2014  . Morphine sulfate Anaphylaxis 12/25/2005  . Naproxen sodium Anaphylaxis 12/25/2005  . Nsaids Anaphylaxis 03/19/2011  . Other Itching 03/09/2014  . Sulfamethoxazole Anaphylaxis 12/25/2005  . Cymbalta [duloxetine hcl] Other (See Comments) 11/02/2015    Family History  Problem Relation Age of Onset  . Stroke Mother   . Anesthesia problems Mother   . Cerebral aneurysm Father   . Skin cancer Father     melanoma  . Stroke Father   . Diabetes Sister     hypoglycemia  . Breast cancer Sister     x 2  . Lung cancer Sister   . Atrial fibrillation Sister   . Lung cancer Sister   . Emphysema Sister   . Atrial fibrillation Sister   . Colon cancer Neg Hx   . Prostate cancer Neg Hx   . Colon polyps Neg Hx   . Esophageal cancer Neg Hx   . Rectal cancer Neg Hx   . Stomach cancer Neg Hx     Social History   Social History  . Marital status: Married    Spouse name: Arbie Cookey  . Number of children: 3  . Years of education: BA+   Occupational History  . 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 Smoking status: Current Every Day Smoker    Packs/day: 0.25    Types: Cigarettes    Last attempt to quit: 04/10/2012  . Smokeless tobacco: Never Used     Comment: quit on-off since 2014   . Alcohol use No  . Drug use: No  . Sexual activity:  Not Currently   Other Topics Concern  . Not on file   Social History Narrative   Married , lives w/ wife   Right-handed   Rare caffeine intake       Review of Systems: All systems reviewed and negative except where noted in HPI.  Physical Exam: Vital signs in last 24 hours: Temp:  [96.6 F (35.9 C)-98.2 F (36.8 C)] 96.6 F (35.9 C) (02/03 0800) Pulse Rate:  [71-91] 87 (02/03 0616) Resp:  [18] 18 (02/03 0616) BP: (114-128)/(68-102) 116/69 (02/03 0616) SpO2:  [97 %-100 %] 100 % (02/03 0616) Weight:  [130 lb 8.2 oz (59.2 kg)-131 lb (59.4 kg)] 130 lb 8.2 oz (59.2 kg) (02/03 0636)  General:   Alert, well-developed, white male in NAD Eyes:  Sclera clear, no icterus.   Conjunctiva pink. Ears:  Normal auditory acuity. Nose:  No deformity, discharge,  or lesions. Mouth:  No deformity or lesions.   Neck:  Supple; no masses  Lungs:  Clear throughout to auscultation.   No wheezes, crackles, or rhonchi.  Heart:  Regular rate and rhythm; no murmurs, clicks, rubs,  or gallops. Abdomen:  Soft,nontender, BS active,nonpalp mass or hsm.   Rectal:  Deferred until time of colonoscopy  Msk:  Symmetrical without gross deformities. . Pulses:  Normal pulses noted. Extremities:  Without clubbing or edema. Neurologic:  Alert and  oriented x4;  grossly normal neurologically. Skin:  Intact without significant lesions or rashes.. Psych:  Alert and cooperative. Normal mood and affect.  Intake/Output from previous day: 02/02 0701 - 02/03 0700 In: 1000 [I.V.:1000] Out: -  Intake/Output this shift: No intake/output data recorded.  Lab Results:  Recent Labs  02/10/16 0446  WBC 9.2  HGB 8.5*  HCT 26.1*  PLT 255   BMET  Recent Labs  02/10/16 0446  NA 141  K 3.9  CL 111  CO2 21*  GLUCOSE 144*  BUN 20  CREATININE 0.86  CALCIUM 8.6*    PT/INR  Recent Labs  02/10/16 0608  LABPROT 14.5  INR 1.12    Tye Savoy, NP-C @  02/10/2016, 9:03 AM  Pager number  [phone]

## 2016-02-10 NOTE — ED Provider Notes (Signed)
[facility] DEPT Provider Note   CSN: [id number] Arrival date & time: 02/10/16  7544     History   Chief Complaint Chief Complaint  Patient presents with  . Rectal Bleeding    HPI Christian Roberts is a 71 y.o. male.  He had a colonoscopy done 2 days ago and had multiple polyps removed. This morning, he woke up and thought he was having diarrhea, but it actually was a mixture of bright and dark red blood. He states that he passed a large amount of blood. There is some mild abdominal cramping. Denies any nausea or vomiting. He denies dizziness or lightheadedness. He is not on any anticoagulants.    Rectal Bleeding    Past Medical History:  Diagnosis Date  . Allergic rhinitis   . Asthma    "I outgrew it"  . Carpal tunnel syndrome   . Chronic rheumatic arthritis ([place]) 01/20/2014   Per Dr. Amil Amen   . Complete tear of rotator cuff 2016   Right, MRI in 2016, complete rupture of long head of the biceps as well as a tear of the subscapularis and supraspinatus tendons, Dr. Mardelle Matte  . Complication of anesthesia   . Constipation   . Diabetes mellitus    2012  . Elevated PSA   . GERD (gastroesophageal reflux disease)   . Headache(784.0)    headaches-related to sinuses & uses Tylenol   . Hemorrhoids   . Hernia   . Hyperlipidemia   . Nocturia   . Numbness of fingers   . Osteoarthritis    ankle- left, hands & neck & hip  . PONV (postoperative nausea and vomiting)    WITH ETHER  . Skin cancer, basal cell    sees derm    Patient Active Problem List   Diagnosis Date Noted  . Iron deficiency anemia 02/06/2016  . Ethmoid sinusitis 01/27/2016  . Epistaxis 01/27/2016  . Carpal tunnel syndrome 12/26/2014  . Acute bronchitis 12/08/2014  . Hypersomnolence 12/08/2014  . Headache disorder 11/14/2014  . Paresthesia of both hands 11/14/2014  . PCP NOTES >>>>>>>>>>>>>>>>>> 11/14/2014  . Rectal bleeding 11/08/2014  . Diarrhea 11/08/2014  . Renal cyst 11/08/2014  . Liver cyst  11/08/2014  . Abnormal CT of the abdomen 11/08/2014  . Annual physical exam 01/20/2014  . Chronic rheumatic arthritis ([place]) 01/20/2014  . Skin infection 12/29/2013  . Skin lesion 07/19/2013  . Recurrent left inguinal hernia 11/02/2012  . Hemorrhoids 05/29/2012  . Left inguinal hernia 03/12/2011  . Diabetes type 2, controlled ([place]) 08/20/2010  . Hyperlipidemia 09/20/2009  . Elevated PSA--BPH-- Dr Risa Grill 07/21/2009  . Neck pain, radiculopathy, s/p surgery, on gabapentin 12/12/2008  . OTHER SPECIFIED DISORDER OF THE ESOPHAGUS 08/24/2008  . GERD 03/30/2007  . DJD (degenerative joint disease) 07/31/2006    Past Surgical History:  Procedure Laterality Date  . ANKLE FUSION Left 03-19-13   Dr Mardelle Matte  . ANKLE SURGERY Left   . APPENDECTOMY     1964  . BACK SURGERY     cerv. fusion C1- T1, 2 separate surgeries , T1-T3 2014  . CARPAL TUNNEL RELEASE Right   . CARPAL TUNNEL RELEASE Left 07-14-2014   [facility]  . CYSTOSCOPY     multiple times for infections  . INGUINAL HERNIA REPAIR  03/27/2011   Procedure: HERNIA REPAIR INGUINAL ADULT;  Surgeon: Harl Bowie, MD;  Location: [facility];  Service: General;  Laterality: Left;  . INGUINAL HERNIA REPAIR Bilateral 12/09/2012   Procedure: LAPAROSCOPIC BILATERAL INGUINAL HERNIA REPAIR  WITH MESH;  Surgeon: Harl Bowie, MD;  Location: [facility];  Service: General;  Laterality: Bilateral;  . INSERTION OF MESH Bilateral 12/09/2012   Procedure: INSERTION OF MESH;  Surgeon: Harl Bowie, MD;  Location: [facility];  Service: General;  Laterality: Bilateral;  . left ankle mass removed  1972   3 surgeries on L ankle- 73, 2008, 2009  . NECK SURGERY  03/11,    C3-C7  . RECTAL SURGERY  at birth   "[place]" SURGICAL CORRECTION  . ROTATOR CUFF REPAIR W/ DISTAL CLAVICLE EXCISION Right 08/25/2014   And acromioplasty, Dr. Mardelle Matte  . SHOULDER SURGERY  09/17/12  . TONSILLECTOMY    . VARICOCELECTOMY         Home Medications    Prior to  Admission medications   Medication Sig Start Date End Date Taking? Authorizing Provider  acetaminophen (TYLENOL) 500 MG tablet Take 500-1,000 mg by mouth every 4 (four) hours as needed. For pain    Historical Provider, MD  albuterol (VENTOLIN HFA) 108 (90 BASE) MCG/ACT inhaler Inhale 2 puffs into the lungs every 6 (six) hours as needed for wheezing or shortness of breath. 03/29/14   Colon Branch, MD  amoxicillin (AMOXIL) 875 MG tablet Take 1 tablet (875 mg total) by mouth 2 (two) times daily. Patient not taking: Reported on 02/08/2016 01/29/16   Colon Branch, MD  Blood Glucose Monitoring Suppl (ONE TOUCH ULTRA SYSTEM KIT) W/DEVICE KIT Use device to check blood sugar. 08/25/14   Colon Branch, MD  diphenhydrAMINE (BENADRYL) 25 MG tablet Take 25 mg by mouth every 6 (six) hours as needed.    Historical Provider, MD  DOCUSATE SODIUM PO Take by mouth. Reported on 01/26/2015    Historical Provider, MD  esomeprazole (NEXIUM) 10 MG packet Take 10 mg by mouth daily before breakfast.    Historical Provider, MD  etanercept (ENBREL) 50 MG/ML injection Inject 50 mg into the skin once a week.    Historical Provider, MD  ferrous sulfate 325 (65 FE) MG tablet Take 1 tablet (325 mg total) by mouth 2 (two) times daily with a meal. 02/08/16 03/24/16  Manus Gunning, MD  fish oil-omega-3 fatty acids 1000 MG capsule Take 1 g by mouth 2 (two) times daily.     Historical Provider, MD  fluticasone (FLONASE) 50 MCG/ACT nasal spray Place 1 spray into both nostrils daily.    Historical Provider, MD  folic acid (FOLVITE) 1 MG tablet Take 1 mg by mouth daily.    Historical Provider, MD  gabapentin (NEURONTIN) 300 MG capsule Take 1 capsule (300 mg total) by mouth 4 (four) times daily. Patient taking differently: Take 300 mg by mouth 5 (five) times daily.  06/12/15   Colon Branch, MD  glucose blood (ACCU-CHEK SMARTVIEW) test strip Reported on 05/24/2015 05/23/12   Historical Provider, MD  glucose blood (FREESTYLE LITE) test strip Check blood  sugar no more than twice daily. 08/09/14   Colon Branch, MD  hydrocortisone 2.5 % cream Apply 1 application topically 2 (two) times daily. Reported on 01/26/2015    Historical Provider, MD  ketoconazole (NIZORAL) 2 % cream Apply 1 application topically daily. 10/18/15   Colon Branch, MD  lidocaine (LIDODERM) 5 % Place 2 patches onto the skin daily. 05/24/15   Colon Branch, MD  Lidocaine HCl (ASPERCREME LIDOCAINE) 4 % LIQD Apply topically.    Historical Provider, MD  Lifitegrast Shirley Friar) 5 % SOLN Apply to eye.  Historical Provider, MD  Menthol, Topical Analgesic, (BIOFREEZE ROLL-ON EX) Apply topically.    Historical Provider, MD  metFORMIN (GLUCOPHAGE) 1000 MG tablet Take 1 tablet (1,000 mg total) by mouth 2 (two) times daily with a meal. 09/20/15   Colon Branch, MD  Methotrexate, PF, 20 MG/0.4ML SOAJ Inject into the skin. Once a week    Historical Provider, MD  MYRBETRIQ 50 MG TB24 tablet  07/05/13   Historical Provider, MD  predniSONE (DELTASONE) 10 MG tablet 3 tabs by mouth daily x 3 days, then 2 tabs by mouth daily x 2 days then 1 tab by mouth daily x 2 days 01/27/16   Amy E Diona Browner, MD  PROCTOSOL HC 2.5 % rectal cream Place rectally 2 (two) times daily. Reported on 01/26/2015 Patient not taking: Reported on 02/08/2016 02/06/16   Janett Billow D Zehr, PA-C  Pseudoephedrine HCl, [place], 30 MG TABA Take 1 tablet by mouth as needed. Reported on 01/26/2015    Historical Provider, MD  Psyllium (METAMUCIL PO) Take by mouth.    Historical Provider, MD  Simethicone (GAS-X PO) Take by mouth. Reported on 01/26/2015    Historical Provider, MD  simvastatin (ZOCOR) 20 MG tablet Take 1 tablet (20 mg total) by mouth at bedtime. 11/06/15   Colon Branch, MD  Tamsulosin HCl (FLOMAX) 0.4 MG CAPS Take 0.4 mg by mouth daily after supper.     Historical Provider, MD  topiramate (TOPAMAX) 100 MG tablet Take 1 tablet (100 mg total) by mouth 2 (two) times daily. 11/06/15   Ward Givens, NP  verapamil (CALAN-SR) 120 MG CR tablet Take 1 tablet  (120 mg total) by mouth at bedtime. 12/19/15   Colon Branch, MD    Family History Family History  Problem Relation Age of Onset  . Stroke Mother   . Anesthesia problems Mother   . Cerebral aneurysm Father   . Skin cancer Father     melanoma  . Stroke Father   . Diabetes Sister     hypoglycemia  . Breast cancer Sister     x 2  . Lung cancer Sister   . Atrial fibrillation Sister   . Lung cancer Sister   . Emphysema Sister   . Atrial fibrillation Sister   . Colon cancer Neg Hx   . Prostate cancer Neg Hx   . Colon polyps Neg Hx   . Esophageal cancer Neg Hx   . Rectal cancer Neg Hx   . Stomach cancer Neg Hx     Social History Social History  Substance Use Topics  . Smoking status: Current Every Day Smoker    Packs/day: 0.25    Types: Cigarettes    Last attempt to quit: 04/10/2012  . Smokeless tobacco: Never Used     Comment: quit on-off since 2014   . Alcohol use No     Allergies   Aspirin; Codeine phosphate; Ibuprofen; Levofloxacin; Menthol; Monosodium glutamate; Morphine sulfate; Naproxen sodium; Nsaids; Other; Sulfamethoxazole; and Cymbalta [duloxetine hcl]   Review of Systems Review of Systems  Gastrointestinal: Positive for hematochezia.  All other systems reviewed and are negative.    Physical Exam Updated Vital Signs BP (!) 126/102 (BP Location: Left Arm)   Pulse 71   Temp 98.2 F (36.8 C) (Oral)   Resp 18   Ht 5' 4"  (1.626 m)   Wt 131 lb (59.4 kg)   SpO2 98%   BMI 22.49 kg/m   Physical Exam  Nursing note and vitals reviewed.  70 year  old male, resting comfortably and in no acute distress. Vital signs are Significant for diastolic hypertension. Oxygen saturation is 98%, which is normal. Head is normocephalic and atraumatic. PERRLA, EOMI. Oropharynx is clear. Neck is nontender and supple without adenopathy or JVD. Back is nontender and there is no CVA tenderness. Lungs are clear without rales, wheezes, or rhonchi. Chest is nontender. Heart has  regular rate and rhythm without murmur. Abdomen is soft, flat, nontender without masses or hepatosplenomegaly and peristalsis is normoactive. Extremities have no cyanosis or edema, full range of motion is present. Skin is warm and dry without rash. Neurologic: Mental status is normal, cranial nerves are intact, there are no motor or sensory deficits.  ED Treatments / Results  Labs (all labs ordered are listed, but only abnormal results are displayed) Labs Reviewed  BASIC METABOLIC PANEL - Abnormal; Notable for the following:       Result Value   CO2 21 (*)    Glucose, Bld 144 (*)    Calcium 8.6 (*)    All other components within normal limits  CBC WITH DIFFERENTIAL/PLATELET - Abnormal; Notable for the following:    RBC 2.84 (*)    Hemoglobin 8.5 (*)    HCT 26.1 (*)    RDW 15.6 (*)    All other components within normal limits  I-STAT CG4 LACTIC ACID, ED  TYPE AND SCREEN    Procedures Procedures (including critical care time) CRITICAL CARE Performed by: GQBVQ,XIHWT Total critical care time: 40 minutes Critical care time was exclusive of separately billable procedures and treating other patients. Critical care was necessary to treat or prevent imminent or life-threatening deterioration. Critical care was time spent personally by me on the following activities: development of treatment plan with patient and/or surrogate as well as nursing, discussions with consultants, evaluation of patient's response to treatment, examination of patient, obtaining history from patient or surrogate, ordering and performing treatments and interventions, ordering and review of laboratory studies, ordering and review of radiographic studies, pulse oximetry and re-evaluation of patient's condition.  Medications Ordered in ED Medications  sodium chloride 0.9 % bolus 1,000 mL (not administered)     Initial Impression / Assessment and Plan / ED Course  I have reviewed the triage vital signs and the  nursing notes.  Pertinent lab results that were available during my care of the patient were reviewed by me and considered in my medical decision making (see chart for details).  Rectal bleeding following polyp removal. Old records were reviewed confirming colonoscopy on February 1. There were 12 identified polyps that were removed including one which required placement of hemostatic clips, and also multiple polyps in the sigmoid colon which were not individually identified. Any one of these could be the bleeding site. He is currently hemodynamically stable. We'll check hemoglobin and started on IV fluids.  Hemoglobin has shown a 3 g drop since preprocedure. Case is discussed with Dr. Myna Hidalgo of [facility] agrees to admit the patient to stepdown unit. Case is discussed with Dr. Fuller Plan, on call for gastroenterology, who will come in to see the patient.  Final Clinical Impressions(s) / ED Diagnoses   Final diagnoses:  Acute lower gastrointestinal bleeding  Acute blood loss anemia    New Prescriptions New Prescriptions   No medications on file     Delora Fuel, MD 88/82/80 [id number]

## 2016-02-10 NOTE — ED Triage Notes (Signed)
Patient complaining of rectal bleeding. Patient started having symptoms this morning. Patient had a colonoscopy without any difficulty. Patient states he woke yesterday morning and was bleeding.

## 2016-02-10 NOTE — ED Notes (Signed)
ICU Charge RN was notified of pt's bed assignment, and transfer in 20 minutes.

## 2016-02-10 NOTE — H&P (Signed)
History and Physical    ELBER GALYEAN BWG:[id number] DOB: May 01, 1945 DOA: 02/10/2016  PCP: Kathlene November, MD   Patient coming from: Home  Chief Complaint: BRBPR   HPI: Christian Roberts is a 71 y.o. male with medical history significant for type 2 diabetes mellitus, chronic daily headaches, rheumatoid arthritis, GERD, recurrent rectal bleeding with iron deficiency anemia, now presenting to the emergency department for evaluation of bright red blood per rectum. Patient was referred to gastroenterology for evaluation of his iron deficiency anemia and was evaluated with upper and lower endoscopy on 02/08/2016. Many polyps were removed during the colonoscopy, most with cold snare, but one removed by hot knife from the ascending colon with 2 hemostatic clips placed. There was no bleeding during or immediately after the procedure. EGD was mainly unremarkable but the gastric body mucosa was nodular, possibly a normal variant, and was biopsied. Patient was discharged home after the procedure in stable condition and reports having an uneventful day yesterday. He went to sleep last night in his usual state but woke this morning at approximately 3:30 AM with a sensation that he would have diarrhea. Patient reports having a large volume of bright red blood without stool or clots. He had been directed by his gastroenterologist to seek evaluation in the emergency department if this were to happen, so the patient did just that. He denies any chest pain or palpitations and denies lightheadedness.  ED Course: Upon arrival to the ED, patient is found to be afebrile, saturating well on room air, and with vital signs stable. Chemistry panel is unremarkable and CBC is notable for a hemoglobin of 8.5, down from 11.5 on 01/29/2016. Patient was given 1 L of normal saline in the emergency department and type and screen was performed. Gastroenterology was consulted by the ED physician and they will come in to evaluate the  patient.while still in the emergency department, the patient experienced another sensation that he would have diarrhea, but had recurrence in bright red blood per rectum. Patient will be admitted to the stepdown unit for ongoing evaluation and management of acute lower GI bleed following polypectomy.  Review of Systems:  All other systems reviewed and apart from HPI, are negative.  Past Medical History:  Diagnosis Date  . Allergic rhinitis   . Asthma    "I outgrew it"  . Carpal tunnel syndrome   . Chronic rheumatic arthritis ([place]) 01/20/2014   Per Dr. Amil Amen   . Complete tear of rotator cuff 2016   Right, MRI in 2016, complete rupture of long head of the biceps as well as a tear of the subscapularis and supraspinatus tendons, Dr. Mardelle Matte  . Complication of anesthesia   . Constipation   . Diabetes mellitus    2012  . Elevated PSA   . GERD (gastroesophageal reflux disease)   . Headache(784.0)    headaches-related to sinuses & uses Tylenol   . Hemorrhoids   . Hernia   . Hyperlipidemia   . Nocturia   . Numbness of fingers   . Osteoarthritis    ankle- left, hands & neck & hip  . PONV (postoperative nausea and vomiting)    WITH ETHER  . Skin cancer, basal cell    sees derm    Past Surgical History:  Procedure Laterality Date  . ANKLE FUSION Left 03-19-13   Dr Mardelle Matte  . ANKLE SURGERY Left   . APPENDECTOMY     1964  . BACK SURGERY     cerv. fusion  C1- T1, 2 separate surgeries , T1-T3 2014  . CARPAL TUNNEL RELEASE Right   . CARPAL TUNNEL RELEASE Left 07-14-2014   [facility]  . CYSTOSCOPY     multiple times for infections  . INGUINAL HERNIA REPAIR  03/27/2011   Procedure: HERNIA REPAIR INGUINAL ADULT;  Surgeon: Harl Bowie, MD;  Location: [facility];  Service: General;  Laterality: Left;  . INGUINAL HERNIA REPAIR Bilateral 12/09/2012   Procedure: LAPAROSCOPIC BILATERAL INGUINAL HERNIA REPAIR WITH MESH;  Surgeon: Harl Bowie, MD;  Location: [facility];  Service: General;   Laterality: Bilateral;  . INSERTION OF MESH Bilateral 12/09/2012   Procedure: INSERTION OF MESH;  Surgeon: Harl Bowie, MD;  Location: [facility];  Service: General;  Laterality: Bilateral;  . left ankle mass removed  1972   3 surgeries on L ankle- 73, 2008, 2009  . NECK SURGERY  03/11,    C3-C7  . RECTAL SURGERY  at birth   "[place]" SURGICAL CORRECTION  . ROTATOR CUFF REPAIR W/ DISTAL CLAVICLE EXCISION Right 08/25/2014   And acromioplasty, Dr. Mardelle Matte  . SHOULDER SURGERY  09/17/12  . TONSILLECTOMY    . VARICOCELECTOMY       reports that he has been smoking Cigarettes.  He has been smoking about 0.25 packs per day. He has never used smokeless tobacco. He reports that he does not drink alcohol or use drugs.  Allergies  Allergen Reactions  . Aspirin Anaphylaxis  . Codeine Phosphate Anaphylaxis  . Ibuprofen Anaphylaxis  . Levofloxacin Itching and Anaphylaxis  . Menthol Swelling  . Monosodium Glutamate Diarrhea and Nausea And Vomiting    Other reaction(s): Cramps (ALLERGY/intolerance)  . Morphine Sulfate Anaphylaxis  . Naproxen Sodium Anaphylaxis  . Nsaids Anaphylaxis  . Other Itching    **BAND-AID**  . Sulfamethoxazole Anaphylaxis  . Cymbalta [Duloxetine Hcl] Other (See Comments)    Dizziness, syncope, and sleepiness     Family History  Problem Relation Age of Onset  . Stroke Mother   . Anesthesia problems Mother   . Cerebral aneurysm Father   . Skin cancer Father     melanoma  . Stroke Father   . Diabetes Sister     hypoglycemia  . Breast cancer Sister     x 2  . Lung cancer Sister   . Atrial fibrillation Sister   . Lung cancer Sister   . Emphysema Sister   . Atrial fibrillation Sister   . Colon cancer Neg Hx   . Prostate cancer Neg Hx   . Colon polyps Neg Hx   . Esophageal cancer Neg Hx   . Rectal cancer Neg Hx   . Stomach cancer Neg Hx      Prior to Admission medications   Medication Sig Start Date End Date Taking? Authorizing Provider    acetaminophen (TYLENOL) 500 MG tablet Take 500-1,000 mg by mouth every 4 (four) hours as needed. For pain   Yes Historical Provider, MD  albuterol (VENTOLIN HFA) 108 (90 BASE) MCG/ACT inhaler Inhale 2 puffs into the lungs every 6 (six) hours as needed for wheezing or shortness of breath. 03/29/14  Yes Colon Branch, MD  diphenhydrAMINE (BENADRYL) 25 MG tablet Take 25 mg by mouth every 6 (six) hours as needed for allergies.    Yes Historical Provider, MD  DOCUSATE SODIUM PO Take 100 mg by mouth daily as needed (constipation). Reported on 01/26/2015   Yes Historical Provider, MD  esomeprazole (NEXIUM) 10 MG packet Take 10 mg  by mouth daily before breakfast.   Yes Historical Provider, MD  etanercept (ENBREL) 50 MG/ML injection Inject 50 mg into the skin once a week.   Yes Historical Provider, MD  ferrous sulfate 325 (65 FE) MG tablet Take 1 tablet (325 mg total) by mouth 2 (two) times daily with a meal. 02/08/16 03/24/16 Yes Manus Gunning, MD  fish oil-omega-3 fatty acids 1000 MG capsule Take 1 g by mouth 2 (two) times daily.    Yes Historical Provider, MD  fluticasone (FLONASE) 50 MCG/ACT nasal spray Place 1 spray into both nostrils daily.   Yes Historical Provider, MD  folic acid (FOLVITE) 1 MG tablet Take 1 mg by mouth daily.   Yes Historical Provider, MD  gabapentin (NEURONTIN) 300 MG capsule Take 1 capsule (300 mg total) by mouth 4 (four) times daily. Patient taking differently: Take 300 mg by mouth 5 (five) times daily.  06/12/15  Yes Colon Branch, MD  hydrocortisone 2.5 % cream Apply 1 application topically daily as needed (itching). Reported on 01/26/2015   Yes Historical Provider, MD  ketoconazole (NIZORAL) 2 % cream Apply 1 application topically daily. 10/18/15  Yes Colon Branch, MD  lidocaine (LIDODERM) 5 % Place 2 patches onto the skin daily. Patient taking differently: Place 2 patches onto the skin daily as needed (pain).  05/24/15  Yes Colon Branch, MD  Lifitegrast Shirley Friar) 5 % SOLN Apply 1  application to eye daily.    Yes Historical Provider, MD  Menthol, Topical Analgesic, (BIOFREEZE ROLL-ON EX) Apply 1 application topically daily as needed (pain).    Yes Historical Provider, MD  metFORMIN (GLUCOPHAGE) 1000 MG tablet Take 1 tablet (1,000 mg total) by mouth 2 (two) times daily with a meal. 09/20/15  Yes Colon Branch, MD  Methotrexate, PF, 20 MG/0.4ML SOAJ Inject into the skin. Every Thursday   Yes Historical Provider, MD  MYRBETRIQ 50 MG TB24 tablet Take 50 mg by mouth daily.  07/05/13  Yes Historical Provider, MD  Psyllium (METAMUCIL PO) Take 1 tablet by mouth daily.    Yes Historical Provider, MD  Simethicone (GAS-X PO) Take 1 tablet by mouth 3 (three) times daily as needed (gas). Reported on 01/26/2015   Yes Historical Provider, MD  simvastatin (ZOCOR) 20 MG tablet Take 1 tablet (20 mg total) by mouth at bedtime. 11/06/15  Yes Colon Branch, MD  Tamsulosin HCl (FLOMAX) 0.4 MG CAPS Take 0.4 mg by mouth daily after supper.    Yes Historical Provider, MD  topiramate (TOPAMAX) 100 MG tablet Take 1 tablet (100 mg total) by mouth 2 (two) times daily. 11/06/15  Yes Ward Givens, NP  verapamil (CALAN-SR) 120 MG CR tablet Take 1 tablet (120 mg total) by mouth at bedtime. 12/19/15  Yes Colon Branch, MD  Blood Glucose Monitoring Suppl (ONE TOUCH ULTRA SYSTEM KIT) W/DEVICE KIT Use device to check blood sugar. 08/25/14   Colon Branch, MD  glucose blood (FREESTYLE LITE) test strip Check blood sugar no more than twice daily. 08/09/14   Colon Branch, MD  predniSONE (DELTASONE) 10 MG tablet 3 tabs by mouth daily x 3 days, then 2 tabs by mouth daily x 2 days then 1 tab by mouth daily x 2 days Patient not taking: Reported on 02/10/2016 01/27/16   Amy E Diona Browner, MD  PROCTOSOL HC 2.5 % rectal cream Place rectally 2 (two) times daily. Reported on 01/26/2015 Patient not taking: Reported on 02/08/2016 02/06/16   Laban Emperor Zehr, PA-C  Physical Exam: Vitals:   02/10/16 0426 02/10/16 0500 02/10/16 0515 02/10/16 0530  BP:  (!) 126/102 128/72  114/68  Pulse: 71 91 88 87  Resp: 18   18  Temp: 98.2 F (36.8 C)     TempSrc: Oral     SpO2: 98% 100% 98% 97%  Weight:      Height:          Constitutional: NAD, calm, comfortable, no pallor or diaphoresis Eyes: PERTLA, lids and conjunctivae normal ENMT: Mucous membranes are moist. Posterior pharynx clear of any exudate or lesions.   Neck: normal, supple, no masses, no thyromegaly Respiratory: clear to auscultation bilaterally, no wheezing, no crackles. Normal respiratory effort.  Cardiovascular: S1 & S2 heard, regular rate and rhythm. No extremity edema. No significant JVD. Abdomen: No distension, no tenderness, no masses palpated. Bowel sounds normal.  Musculoskeletal: no clubbing / cyanosis. No joint deformity upper and lower extremities. Normal muscle tone.  Skin: no significant rashes, lesions, ulcers. Warm, dry, well-perfused. Neurologic: CN 2-12 grossly intact. Sensation intact, DTR normal. Strength 5/5 in all 4 limbs.  Psychiatric: Normal judgment and insight. Alert and oriented x 3. Normal mood and affect.     Labs on Admission: I have personally reviewed following labs and imaging studies  CBC:  Recent Labs Lab 02/10/16 0446  WBC 9.2  NEUTROABS 5.9  HGB 8.5*  HCT 26.1*  MCV 91.9  PLT 161   Basic Metabolic Panel:  Recent Labs Lab 02/10/16 0446  NA 141  K 3.9  CL 111  CO2 21*  GLUCOSE 144*  BUN 20  CREATININE 0.86  CALCIUM 8.6*   GFR: Estimated Creatinine Clearance: 67.9 mL/min (by C-G formula based on SCr of 0.86 mg/dL). Liver Function Tests: No results for input(s): AST, ALT, ALKPHOS, BILITOT, PROT, ALBUMIN in the last 168 hours. No results for input(s): LIPASE, AMYLASE in the last 168 hours. No results for input(s): AMMONIA in the last 168 hours. Coagulation Profile: No results for input(s): INR, PROTIME in the last 168 hours. Cardiac Enzymes: No results for input(s): CKTOTAL, CKMB, CKMBINDEX, TROPONINI in the last 168  hours. BNP (last 3 results) No results for input(s): PROBNP in the last 8760 hours. HbA1C: No results for input(s): HGBA1C in the last 72 hours. CBG:  Recent Labs Lab 02/08/16 1331 02/08/16 1510  GLUCAP 80 93   Lipid Profile: No results for input(s): CHOL, HDL, LDLCALC, TRIG, CHOLHDL, LDLDIRECT in the last 72 hours. Thyroid Function Tests: No results for input(s): TSH, T4TOTAL, FREET4, T3FREE, THYROIDAB in the last 72 hours. Anemia Panel: No results for input(s): VITAMINB12, FOLATE, FERRITIN, TIBC, IRON, RETICCTPCT in the last 72 hours. Urine analysis:    Component Value Date/Time   COLORURINE yellow 10/20/2007 0838   APPEARANCEUR Clear 10/20/2007 0838   LABSPEC 1.020 10/20/2007 0838   PHURINE 5.5 10/20/2007 0838   HGBUR negative 10/20/2007 0838   BILIRUBINUR n 08/02/2011 1413   PROTEINUR n 08/02/2011 1413   UROBILINOGEN 0.2 08/02/2011 1413   UROBILINOGEN 1.0 10/20/2007 0838   NITRITE n 08/02/2011 1413   NITRITE negative 10/20/2007 0838   LEUKOCYTESUR small (1+) 08/02/2011 1413   Sepsis Labs: _0 (procalcitonin:4,lacticidven:4) )No results found for this or any previous visit (from the past 240 hour(s)).   Radiological Exams on Admission: No results found.  EKG: Not performed, will obtain as appropriate.   Assessment/Plan   1. BRBPR, post-polypectomy   - Pt underwent EGD and colonoscopy with numerous polypectomies on 02/08/16  - He woke ~3:30 am on  02/10/16 with sensation of needed to move bowels, but passed BRB only  - Hgb is 8.5 on presentation, down from 11.5 on 01/29/16; he has stable BP and HR   - He was given 1 liter NS in ED and type and screen was obtained; GI was consulted and will come evaluate patient  - There was a second episode of painless BRBPR in ED - Given the acute 3 g drop in Hgb and continued bleeding, will transfuse 1 unit pRBCs now and have a second unit held  - RN asked to place order for post-transfusion CBC  - Keep NPO, follow-up GI  recommendations   2. Iron-deficiency anemia  - Pt has hx of intermittent GIB with IDA  - As above, he was evaluated by GI with endoscopy; gastric mucosa was biopsied and many colonic polyps taken; pathology remains pending  - Type and screen has been performed and 1 unit pRBC ordered for immediate transfusion    3. Type II DM  - A1c was 6.9% in November 2017  - Managed at home with metformin only; this is held on admission  - Check CBG q4h and start a low-intensity sliding-scale correctional   4. Chronic headaches  - Follows with neurology  - Stable, no HA on admission  - Continue suppressive therapies with verapamil and Topomax   5. Rheumatoid arthritis - Stable, managed with methotrexate and Enbrel     DVT prophylaxis: SCD's Code Status: Full  Family Communication: Wife updated at bedside Disposition Plan: Admit to SDU Consults called: Gastroenterology Admission status: Inpatient    Vianne Bulls, MD [facility] Pager [phone]  If 7PM-7AM, please contact night-coverage [email or web address] Password [facility]  02/10/2016, 6:13 AM

## 2016-02-11 ENCOUNTER — Encounter (HOSPITAL_COMMUNITY)
Admission: EM | Disposition: A | Discharge status: Home / Self Care | Payer: Self-pay | Source: Home / Self Care | Attending: Family Medicine

## 2016-02-11 ENCOUNTER — Encounter (HOSPITAL_COMMUNITY): Payer: Self-pay | Admitting: Gastroenterology

## 2016-02-11 DIAGNOSIS — K922 Gastrointestinal hemorrhage, unspecified: Secondary | ICD-10-CM

## 2016-02-11 DIAGNOSIS — K633 Ulcer of intestine: Secondary | ICD-10-CM

## 2016-02-11 DIAGNOSIS — K921 Melena: Secondary | ICD-10-CM

## 2016-02-11 HISTORY — PX: COLONOSCOPY: SHX5424

## 2016-02-11 LAB — GLUCOSE, CAPILLARY
GLUCOSE-CAPILLARY: 112 mg/dL — AB (ref 65–99)
Glucose-Capillary: 224 mg/dL — ABNORMAL HIGH (ref 65–99)
Glucose-Capillary: 127 mg/dL — ABNORMAL HIGH (ref 65–99)
Glucose-Capillary: 170 mg/dL — ABNORMAL HIGH (ref 65–99)

## 2016-02-11 LAB — BASIC METABOLIC PANEL
ANION GAP: 3 — AB (ref 5–15)
BUN: 11 mg/dL (ref 6–20)
CALCIUM: 8.8 mg/dL — AB (ref 8.9–10.3)
CHLORIDE: 114 mmol/L — AB (ref 101–111)
CO2: 24 mmol/L (ref 22–32)
Creatinine, Ser: 1.07 mg/dL (ref 0.61–1.24)
GFR calc Af Amer: >60 mL/min (ref >60)
GFR calc non Af Amer: >60 mL/min (ref >60)
GLUCOSE: 176 mg/dL — AB (ref 65–99)
Potassium: 3.8 mmol/L (ref 3.5–5.1)
Sodium: 141 mmol/L (ref 135–145)

## 2016-02-11 LAB — CBC WITH DIFFERENTIAL/PLATELET
BASOS ABS: 0 10*3/uL (ref 0.0–0.1)
Basophils Relative: 1 %
Eosinophils Absolute: 0.1 10*3/uL (ref 0.0–0.7)
Eosinophils Relative: 2 %
HEMATOCRIT: 27.9 % — AB (ref 39.0–52.0)
HEMOGLOBIN: 8.9 g/dL — AB (ref 13.0–17.0)
LYMPHS PCT: 33 %
Lymphs Abs: 2.4 10*3/uL (ref 0.7–4.0)
MCH: 28.7 pg (ref 26.0–34.0)
MCHC: 31.9 g/dL (ref 30.0–36.0)
MCV: 90 fL (ref 78.0–100.0)
MONO ABS: 0.8 10*3/uL (ref 0.1–1.0)
MONOS PCT: 12 %
NEUTROS ABS: 3.9 10*3/uL (ref 1.7–7.7)
Neutrophils Relative %: 54 %
Platelets: 268 10*3/uL (ref 150–400)
RBC: 3.1 MIL/uL — ABNORMAL LOW (ref 4.22–5.81)
RDW: 16.1 % — AB (ref 11.5–15.5)
WBC: 7.3 10*3/uL (ref 4.0–10.5)

## 2016-02-11 SURGERY — COLONOSCOPY
Anesthesia: Moderate Sedation

## 2016-02-11 MED ORDER — FENTANYL CITRATE (PF) 100 MCG/2ML IJ SOLN
INTRAMUSCULAR | Status: DC | PRN
  Administered 2016-02-11: 25 ug via INTRAVENOUS

## 2016-02-11 MED ORDER — ATORVASTATIN CALCIUM 10 MG PO TABS: 10.0000 mg | ORAL_TABLET | Freq: Every day | ORAL | 0 refills | Status: DC

## 2016-02-11 MED ORDER — MIDAZOLAM HCL 5 MG/5ML IJ SOLN
INTRAMUSCULAR | Status: DC | PRN
  Administered 2016-02-11: 2 mg via INTRAVENOUS

## 2016-02-11 MED ORDER — FENTANYL CITRATE (PF) 100 MCG/2ML IJ SOLN
INTRAMUSCULAR | Status: AC
  Filled 2016-02-11: qty 4

## 2016-02-11 MED ORDER — PANTOPRAZOLE SODIUM 40 MG PO TBEC
40.0000 mg | DELAYED_RELEASE_TABLET | Freq: Every day | ORAL | Status: DC
  Administered 2016-02-11: 40 mg via ORAL
  Filled 2016-02-11 (×4): qty 1

## 2016-02-11 MED ORDER — DIPHENHYDRAMINE HCL 50 MG/ML IJ SOLN
INTRAMUSCULAR | Status: DC | PRN
  Administered 2016-02-11: 25 mg via INTRAVENOUS

## 2016-02-11 MED ORDER — EPINEPHRINE PF 1 MG/10ML IJ SOSY
PREFILLED_SYRINGE | INTRAMUSCULAR | Status: AC
  Filled 2016-02-11: qty 10

## 2016-02-11 MED ORDER — DIPHENHYDRAMINE HCL 50 MG/ML IJ SOLN
INTRAMUSCULAR | Status: AC
  Filled 2016-02-11: qty 1

## 2016-02-11 MED ORDER — MIDAZOLAM HCL 5 MG/ML IJ SOLN
INTRAMUSCULAR | Status: AC
  Filled 2016-02-11: qty 2

## 2016-02-11 NOTE — Progress Notes (Signed)
During procedure patient's blood pressure dropped to 80s over 40s. Dr. Fuller Plan was aware during the case. Verbal order to give 500 normal saline bolus and to not give more sedation. Patient tolerated procedure well and says he is ready to eat and drink again. Patient will continue to be monitored on the stepdown ICU.

## 2016-02-11 NOTE — H&P (View-Only) (Signed)
Referring Provider: [facility]  Primary Care Physician:  Kathlene November, MD Primary Gastroenterologist: Kiln Cellar, MD  Reason for Consultation:  Hematochezia, presumed post-polypectomy bleed    Attending physician's note   I have taken a history, examined the patient and reviewed the chart. I agree with the Advanced Practitioner's note, impression and recommendations. Acute LGI bleed, likely a post polypectomy bleed from the 20-25 mm ascending colon polyp removed by snare with cautery which was tattooed and prophyllactically clipped. Diverticular bleed or bleed from other cold snare polypectomy sites possible but unlikely. ABL anemia on chronic iron deficiency anemia. Transfusions to maintain Hb > 8. Attempted to prep today with Moviprep for colonoscopy later today however pt did not tolerate the taste of Moviprep and declined to take. Will change to Miralax in Gatorade prep and schedule colonoscopy for 0800 tomorrow.   [name], MD [facility] [phone] Mon-Fri 8a-5p [phone] after 5p, weekends, holidays    ASSESSMENT AND PLAN:   1. Postpolypectomy bleed. Patient is hemodynamically stable. Last bloody BM less than an hour ago, dark red per RN. Patient will be scheduled for a repeat colonoscopy for control of bleeding. The risks and benefits of the procedure were discussed and the patient agrees to proceed.   2. ABL superimposed on chronic anemia. Hgb 8.5 down from baseline of 11.5. Getting a unit of blood now  3. Chronic headaches.On Topamax   HPI: Christian Roberts is a 71 y.o. male who underwent EGD / colonoscopy 2 days ago for evaluation of iron deficiency anemia. EGD was unrevealing. Multiple polyps removed  Multiple polyps were removed, including an 55m sessile ascending colon polyp which was lifted then removed by hot snare. For precautionary measures 2 clips were placed and tatoo placed. Patient began having hematochezia during the night. He has mild associated LLQ  discomfort. No dizziness or SOB. He does have a headache, not unusual for him   Past Medical History:  Diagnosis Date  . Allergic rhinitis   . Asthma    "I outgrew it"  . Carpal tunnel syndrome   . Chronic rheumatic arthritis ([place] 01/20/2014   Per Dr. BAmil Amen  . Complete tear of rotator cuff 2016   Right, MRI in 2016, complete rupture of long head of the biceps as well as a tear of the subscapularis and supraspinatus tendons, Dr. LMardelle Matte . Complication of anesthesia   . Constipation   . Diabetes mellitus    2012  . Elevated PSA   . GERD (gastroesophageal reflux disease)   . Headache(784.0)    headaches-related to sinuses & uses Tylenol   . Hemorrhoids   . Hernia   . Hyperlipidemia   . Nocturia   . Numbness of fingers   . Osteoarthritis    ankle- left, hands & neck & hip  . PONV (postoperative nausea and vomiting)    WITH ETHER  . Skin cancer, basal cell    sees derm    Past Surgical History:  Procedure Laterality Date  . ANKLE FUSION Left 03-19-13   Dr LMardelle Matte . ANKLE SURGERY Left   . APPENDECTOMY     1964  . BACK SURGERY     cerv. fusion C1- T1, 2 separate surgeries , T1-T3 2014  . CARPAL TUNNEL RELEASE Right   . CARPAL TUNNEL RELEASE Left 07-14-2014   [place]  . CYSTOSCOPY     multiple times for infections  . INGUINAL HERNIA REPAIR  03/27/2011   Procedure: HERNIA REPAIR INGUINAL ADULT;  Surgeon:  Harl Bowie, MD;  Location: [facility];  Service: General;  Laterality: Left;  . INGUINAL HERNIA REPAIR Bilateral 12/09/2012   Procedure: LAPAROSCOPIC BILATERAL INGUINAL HERNIA REPAIR WITH MESH;  Surgeon: Harl Bowie, MD;  Location: [facility];  Service: General;  Laterality: Bilateral;  . INSERTION OF MESH Bilateral 12/09/2012   Procedure: INSERTION OF MESH;  Surgeon: Harl Bowie, MD;  Location: [facility];  Service: General;  Laterality: Bilateral;  . left ankle mass removed  1972   3 surgeries on L ankle- 73, 2008, 2009  . NECK SURGERY  03/11,    C3-C7  .  RECTAL SURGERY  at birth   "[place]" SURGICAL CORRECTION  . ROTATOR CUFF REPAIR W/ DISTAL CLAVICLE EXCISION Right 08/25/2014   And acromioplasty, Dr. Mardelle Matte  . SHOULDER SURGERY  09/17/12  . TONSILLECTOMY    . VARICOCELECTOMY      Prior to Admission medications   Medication Sig Start Date End Date Taking? Authorizing Provider  acetaminophen (TYLENOL) 500 MG tablet Take 500-1,000 mg by mouth every 4 (four) hours as needed. For pain   Yes Historical Provider, MD  albuterol (VENTOLIN HFA) 108 (90 BASE) MCG/ACT inhaler Inhale 2 puffs into the lungs every 6 (six) hours as needed for wheezing or shortness of breath. 03/29/14  Yes Colon Branch, MD  diphenhydrAMINE (BENADRYL) 25 MG tablet Take 25 mg by mouth every 6 (six) hours as needed for allergies.    Yes Historical Provider, MD  DOCUSATE SODIUM PO Take 100 mg by mouth daily as needed (constipation). Reported on 01/26/2015   Yes Historical Provider, MD  esomeprazole (NEXIUM) 10 MG packet Take 10 mg by mouth daily before breakfast.   Yes Historical Provider, MD  etanercept (ENBREL) 50 MG/ML injection Inject 50 mg into the skin once a week.   Yes Historical Provider, MD  ferrous sulfate 325 (65 FE) MG tablet Take 1 tablet (325 mg total) by mouth 2 (two) times daily with a meal. 02/08/16 03/24/16 Yes Manus Gunning, MD  fish oil-omega-3 fatty acids 1000 MG capsule Take 1 g by mouth 2 (two) times daily.    Yes Historical Provider, MD  fluticasone (FLONASE) 50 MCG/ACT nasal spray Place 1 spray into both nostrils daily.   Yes Historical Provider, MD  folic acid (FOLVITE) 1 MG tablet Take 1 mg by mouth daily.   Yes Historical Provider, MD  gabapentin (NEURONTIN) 300 MG capsule Take 1 capsule (300 mg total) by mouth 4 (four) times daily. Patient taking differently: Take 300 mg by mouth 5 (five) times daily.  06/12/15  Yes Colon Branch, MD  hydrocortisone 2.5 % cream Apply 1 application topically daily as needed (itching). Reported on 01/26/2015    Yes Historical Provider, MD  ketoconazole (NIZORAL) 2 % cream Apply 1 application topically daily. 10/18/15  Yes Colon Branch, MD  lidocaine (LIDODERM) 5 % Place 2 patches onto the skin daily. Patient taking differently: Place 2 patches onto the skin daily as needed (pain).  05/24/15  Yes Colon Branch, MD  Lifitegrast Shirley Friar) 5 % SOLN Apply 1 application to eye daily.    Yes Historical Provider, MD  Menthol, Topical Analgesic, (BIOFREEZE ROLL-ON EX) Apply 1 application topically daily as needed (pain).    Yes Historical Provider, MD  metFORMIN (GLUCOPHAGE) 1000 MG tablet Take 1 tablet (1,000 mg total) by mouth 2 (two) times daily with a meal. 09/20/15  Yes Colon Branch, MD  Methotrexate, PF, 20 MG/0.4ML SOAJ Inject  into the skin. Every Thursday   Yes Historical Provider, MD  MYRBETRIQ 50 MG TB24 tablet Take 50 mg by mouth daily.  07/05/13  Yes Historical Provider, MD  Psyllium (METAMUCIL PO) Take 1 tablet by mouth daily.    Yes Historical Provider, MD  Simethicone (GAS-X PO) Take 1 tablet by mouth 3 (three) times daily as needed (gas). Reported on 01/26/2015   Yes Historical Provider, MD  simvastatin (ZOCOR) 20 MG tablet Take 1 tablet (20 mg total) by mouth at bedtime. 11/06/15  Yes Colon Branch, MD  Tamsulosin HCl (FLOMAX) 0.4 MG CAPS Take 0.4 mg by mouth daily after supper.    Yes Historical Provider, MD  topiramate (TOPAMAX) 100 MG tablet Take 1 tablet (100 mg total) by mouth 2 (two) times daily. 11/06/15  Yes Ward Givens, NP  verapamil (CALAN-SR) 120 MG CR tablet Take 1 tablet (120 mg total) by mouth at bedtime. 12/19/15  Yes Colon Branch, MD  Blood Glucose Monitoring Suppl (ONE TOUCH ULTRA SYSTEM KIT) W/DEVICE KIT Use device to check blood sugar. 08/25/14   Colon Branch, MD  glucose blood (FREESTYLE LITE) test strip Check blood sugar no more than twice daily. 08/09/14   Colon Branch, MD  predniSONE (DELTASONE) 10 MG tablet 3 tabs by mouth daily x 3 days, then 2 tabs by mouth daily x 2 days then 1 tab by mouth  daily x 2 days Patient not taking: Reported on 02/10/2016 01/27/16   Amy E Diona Browner, MD  PROCTOSOL HC 2.5 % rectal cream Place rectally 2 (two) times daily. Reported on 01/26/2015 Patient not taking: Reported on 02/08/2016 02/06/16   Laban Emperor Zehr, PA-C    Current Facility-Administered Medications  Medication Dose Route Frequency Provider Last Rate Last Dose  . 0.9 %  sodium chloride infusion   Intravenous Continuous Vianne Bulls, MD 125 mL/hr at 02/10/16 0615    . acetaminophen (TYLENOL) tablet 650 mg  650 mg Oral Q6H PRN Vianne Bulls, MD   650 mg at 02/10/16 8916   Or  . acetaminophen (TYLENOL) suppository 650 mg  650 mg Rectal Q6H PRN Vianne Bulls, MD      . diphenhydrAMINE (BENADRYL) capsule 25 mg  25 mg Oral Q6H PRN Vianne Bulls, MD      . fluticasone (FLONASE) 50 MCG/ACT nasal spray 1 spray  1 spray Each Nare Daily Ilene Qua Opyd, MD      . folic acid (FOLVITE) tablet 1 mg  1 mg Oral Daily Ilene Qua Opyd, MD      . gabapentin (NEURONTIN) capsule 300 mg  300 mg Oral QID Ilene Qua Opyd, MD   300 mg at 02/10/16 0816  . hydrocortisone cream 1 % 1 application  1 application Topical Daily PRN Ilene Qua Opyd, MD      . insulin aspart (novoLOG) injection 0-9 Units  0-9 Units Subcutaneous Q4H Vianne Bulls, MD   1 Units at 02/10/16 [phone]  . Lifitegrast 5 % SOLN 1 application  1 application Ophthalmic Daily Ilene Qua Opyd, MD      . mirabegron ER (MYRBETRIQ) tablet 50 mg  50 mg Oral Daily Timothy S Opyd, MD      . omega-3 acid ethyl esters (LOVAZA) capsule 1 g  1 g Oral BID Ilene Qua Opyd, MD      . ondansetron (ZOFRAN) tablet 4 mg  4 mg Oral Q6H PRN Vianne Bulls, MD       Or  .  ondansetron (ZOFRAN) injection 4 mg  4 mg Intravenous Q6H PRN Ilene Qua Opyd, MD      . pantoprazole (PROTONIX) injection 40 mg  40 mg Intravenous Q12H Vianne Bulls, MD   40 mg at 02/10/16 0817  . simvastatin (ZOCOR) tablet 20 mg  20 mg Oral QHS Timothy S Opyd, MD      . sodium chloride flush (NS) 0.9 % injection 3  mL  3 mL Intravenous Q12H Timothy S Opyd, MD      . tamsulosin (FLOMAX) capsule 0.4 mg  0.4 mg Oral QPC supper Vianne Bulls, MD      . topiramate (TOPAMAX) tablet 100 mg  100 mg Oral BID Ilene Qua Opyd, MD      . verapamil (CALAN-SR) CR tablet 120 mg  120 mg Oral QHS Vianne Bulls, MD        Allergies as of 02/10/2016 - Review Complete 02/10/2016  Allergen Reaction Noted  . Aspirin Anaphylaxis 12/25/2005  . Codeine phosphate Anaphylaxis 12/25/2005  . Ibuprofen Anaphylaxis 12/25/2005  . Levofloxacin Itching and Anaphylaxis 12/25/2005  . Menthol Swelling 03/09/2014  . Monosodium glutamate Diarrhea and Nausea And Vomiting 03/09/2014  . Morphine sulfate Anaphylaxis 12/25/2005  . Naproxen sodium Anaphylaxis 12/25/2005  . Nsaids Anaphylaxis 03/19/2011  . Other Itching 03/09/2014  . Sulfamethoxazole Anaphylaxis 12/25/2005  . Cymbalta [duloxetine hcl] Other (See Comments) 11/02/2015    Family History  Problem Relation Age of Onset  . Stroke Mother   . Anesthesia problems Mother   . Cerebral aneurysm Father   . Skin cancer Father     melanoma  . Stroke Father   . Diabetes Sister     hypoglycemia  . Breast cancer Sister     x 2  . Lung cancer Sister   . Atrial fibrillation Sister   . Lung cancer Sister   . Emphysema Sister   . Atrial fibrillation Sister   . Colon cancer Neg Hx   . Prostate cancer Neg Hx   . Colon polyps Neg Hx   . Esophageal cancer Neg Hx   . Rectal cancer Neg Hx   . Stomach cancer Neg Hx     Social History   Social History  . Marital status: Married    Spouse name: Arbie Cookey  . Number of children: 3  . Years of education: BA+   Occupational History  . 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 Smoking status: Current Every Day Smoker    Packs/day: 0.25    Types: Cigarettes    Last attempt to quit: 04/10/2012  . Smokeless tobacco: Never Used     Comment: quit on-off since 2014   . Alcohol use No  . Drug use: No  . Sexual activity:  Not Currently   Other Topics Concern  . Not on file   Social History Narrative   Married , lives w/ wife   Right-handed   Rare caffeine intake       Review of Systems: All systems reviewed and negative except where noted in HPI.  Physical Exam: Vital signs in last 24 hours: Temp:  [96.6 F (35.9 C)-98.2 F (36.8 C)] 96.6 F (35.9 C) (02/03 0800) Pulse Rate:  [71-91] 87 (02/03 0616) Resp:  [18] 18 (02/03 0616) BP: (114-128)/(68-102) 116/69 (02/03 0616) SpO2:  [97 %-100 %] 100 % (02/03 0616) Weight:  [130 lb 8.2 oz (59.2 kg)-131 lb (59.4 kg)] 130 lb 8.2 oz (59.2 kg) (02/03 0636)  General:   Alert, well-developed, white male in NAD Eyes:  Sclera clear, no icterus.   Conjunctiva pink. Ears:  Normal auditory acuity. Nose:  No deformity, discharge,  or lesions. Mouth:  No deformity or lesions.   Neck:  Supple; no masses  Lungs:  Clear throughout to auscultation.   No wheezes, crackles, or rhonchi.  Heart:  Regular rate and rhythm; no murmurs, clicks, rubs,  or gallops. Abdomen:  Soft,nontender, BS active,nonpalp mass or hsm.   Rectal:  Deferred until time of colonoscopy  Msk:  Symmetrical without gross deformities. . Pulses:  Normal pulses noted. Extremities:  Without clubbing or edema. Neurologic:  Alert and  oriented x4;  grossly normal neurologically. Skin:  Intact without significant lesions or rashes.. Psych:  Alert and cooperative. Normal mood and affect.  Intake/Output from previous day: 02/02 0701 - 02/03 0700 In: 1000 [I.V.:1000] Out: -  Intake/Output this shift: No intake/output data recorded.  Lab Results:  Recent Labs  02/10/16 0446  WBC 9.2  HGB 8.5*  HCT 26.1*  PLT 255   BMET  Recent Labs  02/10/16 0446  NA 141  K 3.9  CL 111  CO2 21*  GLUCOSE 144*  BUN 20  CREATININE 0.86  CALCIUM 8.6*    PT/INR  Recent Labs  02/10/16 0608  LABPROT 14.5  INR 1.12    Tye Savoy, NP-C @  02/10/2016, 9:03 AM  Pager number  [phone]

## 2016-02-11 NOTE — Op Note (Addendum)
[facility] Patient Name: Christian Roberts Procedure Date: 02/11/2016 MRN: [id number] Attending MD: Ladene Artist , MD Date of Birth: 16-Jan-1945 CSN: [id number] Age: 71 Admit Type: Inpatient Procedure:                Colonoscopy Indications:              Treatment of bleeding from polypectomy site Providers:                Pricilla Riffle. Fuller Plan, MD, Carolynn Comment, RN, Elspeth Cho Tech., Technician Referring MD:             [facility] Medicines:                Monitored Anesthesia Care, Fentanyl 25 micrograms                            IV, Midazolam 2 mg IV, Diphenhydramine 25 mg IV Complications:            No immediate complications. Estimated blood loss:                            None. Estimated Blood Loss:     Estimated blood loss: none. Procedure:                Pre-Anesthesia Assessment:                           - Prior to the procedure, a History and Physical                            was performed, and patient medications and                            allergies were reviewed. The patient's tolerance of                            previous anesthesia was also reviewed. The risks                            and benefits of the procedure and the sedation                            options and risks were discussed with the patient.                            All questions were answered, and informed consent                            was obtained. Prior Anticoagulants: The patient has                            taken no previous anticoagulant or antiplatelet  agents. ASA Grade Assessment: III - A patient with                            severe systemic disease. After reviewing the risks                            and benefits, the patient was deemed in                            satisfactory condition to undergo the procedure.                           After obtaining informed consent, the colonoscope                             was passed under direct vision. Throughout the                            procedure, the patient's blood pressure, pulse, and                            oxygen saturations were monitored continuously. The                            [id number] scope was introduced through                            the anus and advanced to the the cecum, identified                            by appendiceal orifice and ileocecal valve. The                            ileocecal valve, appendiceal orifice, and rectum                            were photographed. The quality of the bowel                            preparation was fair which limited the exam. The                            patient tolerated the procedure well. The                            colonoscopy was somewhat difficult due to                            inadequate bowel prep, significant looping and a                            tortuous colon. Successful completion of the  procedure was aided by using manual pressure and                            withdrawing and reinserting the scope. Scope In: 8:14:19 AM Scope Out: 8:30:31 AM Scope Withdrawal Time: 0 hours 9 minutes 4 seconds  Total Procedure Duration: 0 hours 16 minutes 12 seconds  Findings:      The perianal and digital rectal examinations were normal.      Internal hemorrhoids were found during retroflexion. The hemorrhoids       were small and Grade I (internal hemorrhoids that do not prolapse).      A few 6-7 mm ulcers were found in the transverse colon, recent       polypectomy sites. No bleeding was present. No stigmata of recent       bleeding were seen.      A single (solitary) fifteen mm ulcer was found in the ascending colon at       the polypectomy site with 2 clips previously placed. No bleeding was       present. Stigmata of recent bleeding were present-visible vessel. For       hemostasis, two hemostatic clips were  successfully placed (MR       conditional). 1 additional clip did not deploy correctly. There was no       bleeding during, or at the end, of the procedure. At the end of the       procedure there were 4 clips closing the polypectomy site with 1 clip on       the vessel.      A few medium-mouthed diverticula were found in the sigmoid colon and       descending colon. There was no evidence of diverticular bleeding.      The exam was otherwise without abnormality on direct and retroflexion       views. A detailed exam was not performed as the procedure was focused on       treatment of the polypectomy site. Impression:               - Preparation of the colon was fair.                           - Internal hemorrhoids.                           - A few ulcers in the transverse colon.                           - A single (solitary) ulcer in the ascending colon                            at prior polypectomy site with 2 clips noted. 2                            additional clips placed.                           - Left colon diverticulosis. No evidence of  diverticular bleeding.                           - Otherwise normal exam and direct and retroflexed                            views however a detailed exam was not performed as                            this procedure was focused on treatment of post                            polypectomy bleeding. Moderate Sedation:      Moderate (conscious) sedation was administered by the endoscopy nurse       and supervised by the endoscopist. The following parameters were       monitored: oxygen saturation, heart rate, blood pressure, respiratory       rate, EKG, adequacy of pulmonary ventilation, and response to care.       Total physician intraservice time was 21 minutes. Recommendation:           - Repeat colonoscopy date to be determined after                            pending pathology results are reviewed for                             surveillance.                           - Patient has a contact number available for                            emergencies. The signs and symptoms of potential                            delayed complications were discussed with the                            patient. Return to normal activities tomorrow.                            Written discharge instructions were provided to the                            patient.                           - Full liquid diet and advance to a soft diet.                           - Continue present medications.                           - No fish oil, aspirin, ibuprofen, naproxen, or  other non-steroidal anti-inflammatory drugs for 3                            weeks.                           - Outpatient GI follow up with Dr. Havery Moros.                           - If diet tolerated and patient remains stable will                            be ok discharge later today or tomorrow. Procedure Code(s):        --- Professional ---                           [phone], Colonoscopy, flexible; with control of                            bleeding, any method                           [id number], Moderate sedation services provided by the                            same physician or other qualified health care                            professional performing the diagnostic or                            therapeutic service that the sedation supports,                            requiring the presence of an independent trained                            observer to assist in the monitoring of the                            patient's level of consciousness and physiological                            status; initial 15 minutes of intraservice time,                            patient age 50 years or older Diagnosis Code(s):        --- Professional ---                           [id number], Postprocedural hemorrhage of a digestive                             system organ or structure following a digestive  system procedure CPT copyright 2016 [employer]. All rights reserved. The codes documented in this report are preliminary and upon coder review may  be revised to meet current compliance requirements. Ladene Artist, MD 02/11/2016 8:50:20 AM This report has been signed electronically. Number of Addenda: 0

## 2016-02-11 NOTE — Progress Notes (Signed)
PROGRESS NOTE    BANE HAGY  GEZ:[id number] DOB: Oct 09, 1945 DOA: 02/10/2016 PCP: Kathlene November, MD     Brief Narrative:  Christian Roberts is a 71 y.o. male with medical history significant for type 2 diabetes mellitus, chronic daily headaches, rheumatoid arthritis, GERD, recurrent rectal bleeding with iron deficiency anemia, now presenting to the emergency department for evaluation of bright red blood per rectum. Patient was referred to gastroenterology for evaluation of his iron deficiency anemia and was evaluated with upper and lower endoscopy on 02/08/2016. Many polyps were removed during the colonoscopy, most with cold snare, but one removed by hot knife from the ascending colon with 2 hemostatic clips placed. There was no bleeding during or immediately after the procedure. EGD was mainly unremarkable but the gastric body mucosa was nodular, possibly a normal variant, and was biopsied. Patient was discharged home after the procedure in stable condition and reports having an uneventful day yesterday. He went to sleep last night in his usual state but woke this morning at approximately 3:30 AM with a sensation that he would have diarrhea. Patient reports having a large volume of bright red blood without stool or clots.  Assessment & Plan:   Principal Problem:   Rectal bleeding Active Problems:   GERD   Diabetes type 2, controlled (HCC)   Chronic rheumatic arthritis (HCC)   Chronic headache   Iron deficiency anemia   Lower gastrointestinal bleeding   S/P colonoscopic polypectomy   Bright red rectal bleeding   Acute blood loss anemia   BRBPR, post-polypectomy   - Pt underwent EGD and colonoscopy with numerous polypectomies on 02/08/16  - Received 1u pRBC, CBC now stable - Colonoscopy 2/4 with single ulcer in ascending colon with 2 additional clips placed - Full liquid diet and advance as tolerated - Possibly discharge later today vs tmrw depending on patient's clinical improvement today  with diet   Iron-deficiency anemia  - Pt has hx of intermittent GIB with IDA  - As above, he was evaluated by GI with endoscopy; gastric mucosa was biopsied and many colonic polyps taken; pathology remains pending   Type II DM  - A1c was 6.9% in November 2017  - Managed at home with metformin only; this is held on admission  - SSI   Chronic headaches  - Follows with neurology  - Continue suppressive therapies with verapamil and Topomax   Rheumatoid arthritis - Stable, managed with methotrexate and Enbrel     DVT prophylaxis: SCDs Code Status: Full Family Communication: wife at bedside Disposition Plan: home    Consultants:   GI  Procedures:   Colonoscopy 2/3  Antimicrobials:   None    Subjective: Patient underwent colonoscopy this morning without acute complications. Currently complaint is hunger but no nausea, vomiting, BRBPR, abdominal pain.   Objective: Vitals:   02/11/16 0835 02/11/16 0840 02/11/16 0845 02/11/16 1100  BP: (!) 91/42 (!) 97/44 (!) 111/57 (!) 114/55  Pulse: 71 70 75 81  Resp: _0 Temp:      TempSrc:      SpO2: 100% 100% 100% 98%  Weight:      Height:        Intake/Output Summary (Last 24 hours) at 02/11/16 1153 Last data filed at 02/11/16 0500  Gross per 24 hour  Intake          4418.33 ml  Output              300 ml  Net  4118.33 ml   Filed Weights   02/10/16 0424 02/10/16 0636 02/11/16 0500  Weight: 59.4 kg (131 lb) 59.2 kg (130 lb 8.2 oz) 60.2 kg (132 lb 11.5 oz)    Examination:  General exam: Appears calm and comfortable  Respiratory system: Clear to auscultation. Respiratory effort normal. Cardiovascular system: S1 & S2 heard, RRR. No JVD, murmurs, rubs, gallops or clicks. No pedal edema. Gastrointestinal system: Abdomen is nondistended, soft and nontender. No organomegaly or masses felt. Normal bowel sounds heard. Central nervous system: Alert and oriented. No focal neurological deficits. Extremities:  Symmetric 5 x 5 power. Skin: No rashes, lesions or ulcers Psychiatry: Judgement and insight appear normal. Mood & affect appropriate.   Data Reviewed: I have personally reviewed following labs and imaging studies  CBC:  Recent Labs Lab 02/10/16 0446 02/11/16 0646  WBC 9.2 7.3  NEUTROABS 5.9 3.9  HGB 8.5* 8.9*  HCT 26.1* 27.9*  MCV 91.9 90.0  PLT 255 595   Basic Metabolic Panel:  Recent Labs Lab 02/10/16 0446 02/11/16 0646  NA 141 141  K 3.9 3.8  CL 111 114*  CO2 21* 24  GLUCOSE 144* 176*  BUN 20 11  CREATININE 0.86 1.07  CALCIUM 8.6* 8.8*   GFR: Estimated Creatinine Clearance: 54.6 mL/min (by C-G formula based on SCr of 1.07 mg/dL). Liver Function Tests: No results for input(s): AST, ALT, ALKPHOS, BILITOT, PROT, ALBUMIN in the last 168 hours. No results for input(s): LIPASE, AMYLASE in the last 168 hours. No results for input(s): AMMONIA in the last 168 hours. Coagulation Profile:  Recent Labs Lab 02/10/16 0608  INR 1.12   Cardiac Enzymes: No results for input(s): CKTOTAL, CKMB, CKMBINDEX, TROPONINI in the last 168 hours. BNP (last 3 results) No results for input(s): PROBNP in the last 8760 hours. HbA1C: No results for input(s): HGBA1C in the last 72 hours. CBG:  Recent Labs Lab 02/10/16 1559 02/10/16 2101 02/10/16 2349 02/11/16 0523 02/11/16 0728  GLUCAP 108* 123* 93 224* 127*   Lipid Profile: No results for input(s): CHOL, HDL, LDLCALC, TRIG, CHOLHDL, LDLDIRECT in the last 72 hours. Thyroid Function Tests: No results for input(s): TSH, T4TOTAL, FREET4, T3FREE, THYROIDAB in the last 72 hours. Anemia Panel: No results for input(s): VITAMINB12, FOLATE, FERRITIN, TIBC, IRON, RETICCTPCT in the last 72 hours. Sepsis Labs:  Recent Labs Lab 02/10/16 0501  LATICACIDVEN 1.26    Recent Results (from the past 240 hour(s))  MRSA PCR Screening     Status: Abnormal   Collection Time: 02/10/16  6:32 AM  Result Value Ref Range Status   MRSA by PCR  POSITIVE (A) NEGATIVE Final    Comment: RESULT CALLED TO, READ BACK BY AND VERIFIED WITH: M GILLIAM AT 1300 ON 02.03.2018 BY NBROOKS        The GeneXpert MRSA Assay (FDA approved for NASAL specimens only), is one component of a comprehensive MRSA colonization surveillance program. It is not intended to diagnose MRSA infection nor to guide or monitor treatment for MRSA infections.        Radiology Studies: No results found.    Scheduled Meds: . atorvastatin  10 mg Oral QHS  . Chlorhexidine Gluconate Cloth  6 each Topical Q0600  . fluticasone  1 spray Each Nare Daily  . folic acid  1 mg Oral Daily  . gabapentin  300 mg Oral QID  . insulin aspart  0-9 Units Subcutaneous Q4H  . Lifitegrast  1 application Ophthalmic Daily  . mirabegron ER  50 mg Oral  QHS  . mupirocin ointment  1 application Nasal BID  . omega-3 acid ethyl esters  1 g Oral BID  . pantoprazole  40 mg Oral Q0600  . peg 3350 powder  1 kit Oral Once  . sodium chloride flush  3 mL Intravenous Q12H  . tamsulosin  0.4 mg Oral QPC supper  . topiramate  100 mg Oral 2 times per day  . verapamil  120 mg Oral QHS   Continuous Infusions: . sodium chloride 75 mL/hr at 02/11/16 1006     LOS: 1 day    Time spent: 30 minutes   Dessa Phi, DO [facility] [email or web address] Password [email or web address] 02/11/2016, 11:53 AM

## 2016-02-11 NOTE — Discharge Summary (Signed)
Physician Discharge Summary  Christian Roberts XTG:[id number] DOB: [birth year] DOA: 02/10/2016  PCP: Christian November, MD  Admit date: 02/10/2016 Discharge date: 02/11/2016  Admitted From: Home Disposition:  Home  Recommendations for Outpatient Follow-up:  1. Follow up with PCP in 1 week  2. Follow up with GI in 1 week  3. Please obtain CBC in 1 week  4. Please follow up on the following pending results: polyp biopsy results  5. Do not take any blood thinning agents, NSAIDs, aspirin  6. Simvastatin(Zocor) was changed to an equivalent dose of atorvastatin(Lipitor) due to the potential drug interaction with verapamil. When taken in combination with medications that inhibit its metabolism, simvastatin can accumulate which increases the risk of liver toxicity, myopathy, or rhabdomyolysis.  Home Health: No  Equipment/Devices: None   Discharge Condition: Stable CODE STATUS: Full  Diet recommendation: Carb modified / heart healthy   Brief/Interim Summary: From H&P: Christian Roberts a 71 y.o.malewith medical history significant for type 2 diabetes mellitus, chronic daily headaches, rheumatoid arthritis, GERD, recurrent rectal bleeding with iron deficiency anemia, now presenting to the emergency department for evaluation of bright red blood per rectum. Patient was referred to gastroenterology for evaluation of his iron deficiency anemia and was evaluated with upper and lower endoscopy on 02/08/2016. Many polyps were removed during the colonoscopy, most with cold snare, but one removed by hot knife from the ascending colon with 2 hemostatic clips placed. There was no bleeding during or immediately after the procedure. EGD was mainly unremarkable but the gastric body mucosa was nodular, possibly a normal variant, and was biopsied. Patient was discharged home after the procedure in stable condition and reports having an uneventful day yesterday. He went to sleep last night in his usual state but woke this morning  at approximately 3:30 AM with a sensation that he would have diarrhea. Patient reports having a large volume of bright red blood without stool or clots.  Interim: He was given 1 u pRBC and GI consulted. Underwent colonoscopy this morning and had additional clips placed. Post-procedure, he progressed well and was tolerating diet. Hgb stabilized.   Discharge Diagnoses:  Principal Problem:   Rectal bleeding Active Problems:   GERD   Diabetes type 2, controlled (HCC)   Chronic rheumatic arthritis (HCC)   Chronic headache   Iron deficiency anemia   Lower gastrointestinal bleeding   S/P colonoscopic polypectomy   Bright red rectal bleeding   Acute blood loss anemia  BRBPR, post-polypectomy  - Pt underwent EGD and colonoscopy with numerous polypectomies on 02/08/16  - Received 1u pRBC, CBC now stable - Colonoscopy 2/4 with single ulcer in ascending colon with 2 additional clips placed - Full liquid diet and advance as tolerated, tolerating diet well this afternoon  - Follow up with PCP and GI outpatient   Iron-deficiency anemia  - Pt has hx of intermittent GIB with IDA  - As above, he was evaluated by GI with endoscopy; gastric mucosa was biopsied and many colonic polyps taken; pathology remains pending   Type II DM  - A1c was 6.9% in Roberts 2017  - Managed at home with metformin only; this is held on admission  - SSI   Chronic headaches  - Follows with neurology  - Continue suppressive therapies with verapamil and Topomax   Rheumatoid arthritis - Stable, managed with methotrexate and Enbrel      Discharge Instructions  Discharge Instructions    Diet - low sodium heart healthy    Complete by:  As directed    Discharge instructions    Complete by:  As directed    Recommendations for Outpatient Follow-up:  Follow up with PCP in 1 week  Follow up with GI in 1 week  Please obtain CBC in 1 week  Please follow up on the following pending results: polyp biopsy results   Do not take any blood thinning agents, NSAIDs, aspirin  Simvastatin(Zocor) was changed to an equivalent dose of atorvastatin(Lipitor) due to the potential drug interaction with verapamil. When taken in combination with medications that inhibit its metabolism, simvastatin can accumulate which increases the risk of liver toxicity, myopathy, or rhabdomyolysis.   Increase activity slowly    Complete by:  As directed      Allergies as of 02/11/2016      Reactions   Aspirin Anaphylaxis   Codeine Phosphate Anaphylaxis   Ibuprofen Anaphylaxis   Levofloxacin Itching, Anaphylaxis   Menthol Swelling   Monosodium Glutamate Diarrhea, Nausea And Vomiting   Other reaction(s): Cramps (ALLERGY/intolerance)   Morphine Sulfate Anaphylaxis   Naproxen Sodium Anaphylaxis   Nsaids Anaphylaxis   Other Itching   **BAND-AID**   Sulfamethoxazole Anaphylaxis   Cymbalta [duloxetine Hcl] Other (See Comments)   Dizziness, syncope, and sleepiness       Medication List    STOP taking these medications   amoxicillin 875 MG tablet Commonly known as:  AMOXIL   predniSONE 10 MG tablet Commonly known as:  DELTASONE   simvastatin 20 MG tablet Commonly known as:  ZOCOR     TAKE these medications   acetaminophen 500 MG tablet Commonly known as:  TYLENOL Take 500-1,000 mg by mouth every 4 (four) hours as needed. For pain   albuterol 108 (90 Base) MCG/ACT inhaler Commonly known as:  VENTOLIN HFA Inhale 2 puffs into the lungs every 6 (six) hours as needed for wheezing or shortness of breath.   atorvastatin 10 MG tablet Commonly known as:  LIPITOR Take 1 tablet (10 mg total) by mouth at bedtime.   BENADRYL 25 MG tablet Generic drug:  diphenhydrAMINE Take 25 mg by mouth every 6 (six) hours as needed for allergies.   BIOFREEZE ROLL-ON EX Apply 1 application topically daily as needed (pain).   DOCUSATE SODIUM PO Take 100 mg by mouth daily as needed (constipation). Reported on 01/26/2015   ENBREL 50 MG/ML  injection Generic drug:  etanercept Inject 50 mg into the skin once a week.   esomeprazole 10 MG packet Commonly known as:  NEXIUM Take 10 mg by mouth daily before breakfast.   ferrous sulfate 325 (65 FE) MG tablet Take 1 tablet (325 mg total) by mouth 2 (two) times daily with a meal.   fish oil-omega-3 fatty acids 1000 MG capsule Take 1 g by mouth 2 (two) times daily.   fluticasone 50 MCG/ACT nasal spray Commonly known as:  FLONASE Place 1 spray into both nostrils daily.   folic acid 1 MG tablet Commonly known as:  FOLVITE Take 1 mg by mouth daily.   gabapentin 300 MG capsule Commonly known as:  NEURONTIN Take 1 capsule (300 mg total) by mouth 4 (four) times daily. What changed:  when to take this   GAS-X PO Take 1 tablet by mouth 3 (three) times daily as needed (gas). Reported on 01/26/2015   glucose blood test strip Commonly known as:  FREESTYLE LITE Check blood sugar no more than twice daily.   hydrocortisone 2.5 % cream Apply 1 application topically daily as needed (itching). Reported on  01/26/2015   ketoconazole 2 % cream Commonly known as:  NIZORAL Apply 1 application topically daily.   lidocaine 5 % Commonly known as:  LIDODERM Place 2 patches onto the skin daily. What changed:  when to take this  reasons to take this   METAMUCIL PO Take 1 tablet by mouth daily.   metFORMIN 1000 MG tablet Commonly known as:  GLUCOPHAGE Take 1 tablet (1,000 mg total) by mouth 2 (two) times daily with a meal.   Methotrexate (PF) 20 MG/0.4ML Soaj Inject into the skin. Every Thursday   MYRBETRIQ 50 MG Tb24 tablet Generic drug:  mirabegron ER Take 50 mg by mouth daily.   ONE TOUCH ULTRA SYSTEM KIT w/Device Kit Use device to check blood sugar.   PROCTOSOL HC 2.5 % rectal cream Generic drug:  hydrocortisone Place rectally 2 (two) times daily. Reported on 01/26/2015   tamsulosin 0.4 MG Caps capsule Commonly known as:  FLOMAX Take 0.4 mg by mouth daily after  supper.   topiramate 100 MG tablet Commonly known as:  TOPAMAX Take 1 tablet (100 mg total) by mouth 2 (two) times daily.   verapamil 120 MG CR tablet Commonly known as:  CALAN-SR Take 1 tablet (120 mg total) by mouth at bedtime.   XIIDRA 5 % Soln Generic drug:  Lifitegrast Apply 1 application to eye daily.      [place], MD. Schedule an appointment as soon as possible for a visit in 1 week(s).   Specialty:  Internal Medicine Contact information: [address] [place] [address] [phone]        Manus Gunning, MD. Schedule an appointment as soon as possible for a visit in 1 week(s).   Specialty:  Gastroenterology Contact information: [address] [place] [address] [phone]          Allergies  Allergen Reactions  . Aspirin Anaphylaxis  . Codeine Phosphate Anaphylaxis  . Ibuprofen Anaphylaxis  . Levofloxacin Itching and Anaphylaxis  . Menthol Swelling  . Monosodium Glutamate Diarrhea and Nausea And Vomiting    Other reaction(s): Cramps (ALLERGY/intolerance)  . Morphine Sulfate Anaphylaxis  . Naproxen Sodium Anaphylaxis  . Nsaids Anaphylaxis  . Other Itching    **BAND-AID**  . Sulfamethoxazole Anaphylaxis  . Cymbalta [Duloxetine Hcl] Other (See Comments)    Dizziness, syncope, and sleepiness     Consultations:  GI   Procedures/Studies: No results found.  Colonoscopy 02/11/2016    Discharge Exam: Vitals:   02/11/16 1400 02/11/16 1500  BP: (!) 115/50   Pulse: 75 70  Resp: 18 18  Temp:     Vitals:   02/11/16 1200 02/11/16 1300 02/11/16 1400 02/11/16 1500  BP: (!) 119/59 (!) 114/54 (!) 115/50   Pulse: 90 76 75 70  Resp: 16 17 18 18   Temp: 97.3 F (36.3 C)     TempSrc: Oral     SpO2: 99% 96% 97% 97%  Weight:      Height:       General: Pt is alert, awake, not in acute distress Cardiovascular: RRR, S1/S2 +, no rubs, no gallops Respiratory: CTA bilaterally, no  wheezing, no rhonchi Abdominal: Soft, NT, ND, bowel sounds + Extremities: no edema, no cyanosis    The results of significant diagnostics from this hospitalization (including imaging, microbiology, ancillary and laboratory) are listed below for reference.     Microbiology: Recent Results (from the past 240 hour(s))  MRSA PCR Screening  Status: Abnormal   Collection Time: 02/10/16  6:32 AM  Result Value Ref Range Status   MRSA by PCR POSITIVE (A) NEGATIVE Final    Comment: RESULT CALLED TO, READ BACK BY AND VERIFIED WITH: M GILLIAM AT 22 ON 02.03.2018 BY NBROOKS        The GeneXpert MRSA Assay (FDA approved for NASAL specimens only), is one component of a comprehensive MRSA colonization surveillance program. It is not intended to diagnose MRSA infection nor to guide or monitor treatment for MRSA infections.      Labs: BNP (last 3 results) No results for input(s): BNP in the last 8760 hours. Basic Metabolic Panel:  Recent Labs Lab 02/10/16 0446 02/11/16 0646  NA 141 141  K 3.9 3.8  CL 111 114*  CO2 21* 24  GLUCOSE 144* 176*  BUN 20 11  CREATININE 0.86 1.07  CALCIUM 8.6* 8.8*   Liver Function Tests: No results for input(s): AST, ALT, ALKPHOS, BILITOT, PROT, ALBUMIN in the last 168 hours. No results for input(s): LIPASE, AMYLASE in the last 168 hours. No results for input(s): AMMONIA in the last 168 hours. CBC:  Recent Labs Lab 02/10/16 0446 02/11/16 0646  WBC 9.2 7.3  NEUTROABS 5.9 3.9  HGB 8.5* 8.9*  HCT 26.1* 27.9*  MCV 91.9 90.0  PLT 255 268   Cardiac Enzymes: No results for input(s): CKTOTAL, CKMB, CKMBINDEX, TROPONINI in the last 168 hours. BNP: Invalid input(s): POCBNP CBG:  Recent Labs Lab 02/10/16 2349 02/11/16 0523 02/11/16 0728 02/11/16 1234 02/11/16 1528  GLUCAP 93 224* 127* 170* 112*   D-Dimer No results for input(s): DDIMER in the last 72 hours. Hgb A1c No results for input(s): HGBA1C in the last 72 hours. Lipid  Profile No results for input(s): CHOL, HDL, LDLCALC, TRIG, CHOLHDL, LDLDIRECT in the last 72 hours. Thyroid function studies No results for input(s): TSH, T4TOTAL, T3FREE, THYROIDAB in the last 72 hours.  Invalid input(s): FREET3 Anemia work up No results for input(s): VITAMINB12, FOLATE, FERRITIN, TIBC, IRON, RETICCTPCT in the last 72 hours. Urinalysis    Component Value Date/Time   COLORURINE yellow 10/20/2007 0838   APPEARANCEUR Clear 10/20/2007 0838   LABSPEC 1.020 10/20/2007 0838   PHURINE 5.5 10/20/2007 0838   HGBUR negative 10/20/2007 0838   BILIRUBINUR n 08/02/2011 1413   PROTEINUR n 08/02/2011 1413   UROBILINOGEN 0.2 08/02/2011 1413   UROBILINOGEN 1.0 10/20/2007 0838   NITRITE n 08/02/2011 1413   NITRITE negative 10/20/2007 0838   LEUKOCYTESUR small (1+) 08/02/2011 1413   Sepsis Labs Invalid input(s): PROCALCITONIN,  WBC,  LACTICIDVEN Microbiology Recent Results (from the past 240 hour(s))  MRSA PCR Screening     Status: Abnormal   Collection Time: 02/10/16  6:32 AM  Result Value Ref Range Status   MRSA by PCR POSITIVE (A) NEGATIVE Final    Comment: RESULT CALLED TO, READ BACK BY AND VERIFIED WITH: M GILLIAM AT 1300 ON 02.03.2018 BY NBROOKS        The GeneXpert MRSA Assay (FDA approved for NASAL specimens only), is one component of a comprehensive MRSA colonization surveillance program. It is not intended to diagnose MRSA infection nor to guide or monitor treatment for MRSA infections.      Time coordinating discharge: 40 minutes  SIGNED:  Dessa Phi, DO [facility] Pager [phone]  If 7PM-7AM, please contact night-coverage [email or web address] Password [email or web address] 02/11/2016, 4:15 PM

## 2016-02-11 NOTE — Interval H&P Note (Signed)
History and Physical Interval Note:  02/11/2016 8:09 AM  Christian Roberts  has presented today for surgery, with the diagnosis of post-polypectomy bleed  The various methods of treatment have been discussed with the patient and family. After consideration of risks, benefits and other options for treatment, the patient has consented to  Procedure(s): COLONOSCOPY (N/A) as a surgical intervention .  The patient's history has been reviewed, patient examined, no change in status, stable for surgery.  I have reviewed the patient's chart and labs.  Questions were answered to the patient's satisfaction.     Pricilla Riffle. Fuller Plan

## 2016-02-12 ENCOUNTER — Telehealth: Payer: Self-pay | Admitting: Internal Medicine

## 2016-02-12 ENCOUNTER — Telehealth: Payer: Self-pay | Admitting: Gastroenterology

## 2016-02-12 NOTE — Telephone Encounter (Signed)
Patient called with questions regarding his ER discharge instructions on who to schedule what with. Please advise  Phone: [phone]

## 2016-02-12 NOTE — Telephone Encounter (Signed)
Spoke to patient, I have scheduled him to see one of our APP on 2/13.

## 2016-02-12 NOTE — Telephone Encounter (Signed)
Appointment scheduled for patient Hospital follow up. 02/16/16 at 2:30 pm

## 2016-02-12 NOTE — Telephone Encounter (Signed)
Pt admitted 02/10/2016. Needs hosp f/u.

## 2016-02-14 ENCOUNTER — Telehealth: Payer: Self-pay | Admitting: Internal Medicine

## 2016-02-14 LAB — TYPE AND SCREEN
ABO/RH(D): A POS
Antibody Screen: NEGATIVE
UNIT DIVISION: 0
UNIT DIVISION: 0
Unit division: 0

## 2016-02-14 NOTE — Telephone Encounter (Signed)
Pt called called in he says that he had a colonoscopy with Dr. Fuller Plan and was told that records will be sent to PCP. Pt would like to have PCP review results and go over them with him.    Please call back [phone]

## 2016-02-14 NOTE — Telephone Encounter (Signed)
Pt seeing Percell Miller on 02/16/2016 for hosp f/u, requesting to speak w/ PCP.

## 2016-02-14 NOTE — Telephone Encounter (Signed)
Relation to PO:[id number] Call back number:[phone] Pharmacy:    [id number] - [place], [phone] (Phone) [phone] (Fax)     Reason for call:  Patient requesting a 90 day supply gabapentin (NEURONTIN) 300 MG capsule and  verapamil (CALAN-SR) 120 MG CR tablet

## 2016-02-14 NOTE — Telephone Encounter (Signed)
Patient canceled Hospital Follow up with PA and would like to follow up with PCP only but due to transportation patient can only be seen in the afternoon, please advise if same day 30 minute hospital follow up can be scheduled, please advise

## 2016-02-14 NOTE — Telephone Encounter (Signed)
Were review the report  together when he comes back for a checkup

## 2016-02-14 NOTE — Telephone Encounter (Signed)
Pt was admitted 02/10/2016 for GI bleed.

## 2016-02-15 MED ORDER — VERAPAMIL HCL ER 120 MG PO TBCR: 120.0000 mg | EXTENDED_RELEASE_TABLET | Freq: Every day | ORAL | 1 refills | Status: DC

## 2016-02-15 MED ORDER — GABAPENTIN 300 MG PO CAPS: 300.0000 mg | ORAL_CAPSULE | Freq: Four times a day (QID) | ORAL | 1 refills | Status: DC

## 2016-02-15 NOTE — Telephone Encounter (Signed)
Okay to use two 15 min appts.

## 2016-02-15 NOTE — Telephone Encounter (Signed)
Patient scheduled with Dr. Larose Kells at 02/16/2016 at 2:30 hospital follow up 30 minutes.

## 2016-02-15 NOTE — Telephone Encounter (Signed)
Rxs sent

## 2016-02-16 ENCOUNTER — Encounter: Payer: Self-pay | Admitting: Internal Medicine

## 2016-02-16 ENCOUNTER — Inpatient Hospital Stay: Payer: BLUE CROSS/BLUE SHIELD | Admitting: Medical

## 2016-02-16 ENCOUNTER — Inpatient Hospital Stay: Payer: BLUE CROSS/BLUE SHIELD | Admitting: Internal Medicine

## 2016-02-16 ENCOUNTER — Ambulatory Visit (INDEPENDENT_AMBULATORY_CARE_PROVIDER_SITE_OTHER): Payer: BLUE CROSS/BLUE SHIELD | Admitting: Internal Medicine

## 2016-02-16 VITALS — BP 120/56 | HR 96 | Temp 97.9°F | Resp 12 | Ht 64.0 in | Wt 134.1 lb

## 2016-02-16 DIAGNOSIS — K922 Gastrointestinal hemorrhage, unspecified: Secondary | ICD-10-CM

## 2016-02-16 DIAGNOSIS — D509 Iron deficiency anemia, unspecified: Secondary | ICD-10-CM | POA: Diagnosis not present

## 2016-02-16 LAB — CBC WITH DIFFERENTIAL/PLATELET
BASOS PCT: 1 %
Basophils Absolute: 63 cells/uL (ref 0–200)
EOS PCT: 3 %
Eosinophils Absolute: 189 cells/uL (ref 15–500)
HEMATOCRIT: 29.4 % — AB (ref 38.5–50.0)
HEMOGLOBIN: 9.5 g/dL — AB (ref 13.2–17.1)
LYMPHS ABS: 1890 {cells}/uL (ref 850–3900)
Lymphocytes Relative: 30 %
MCH: 29.3 pg (ref 27.0–33.0)
MCHC: 32.3 g/dL (ref 32.0–36.0)
MCV: 90.7 fL (ref 80.0–100.0)
MONO ABS: 882 {cells}/uL (ref 200–950)
MPV: 8.2 fL (ref 7.5–12.5)
Monocytes Relative: 14 %
NEUTROS ABS: 3276 {cells}/uL (ref 1500–7800)
NEUTROS PCT: 52 %
Platelets: 329 10*3/uL (ref 140–400)
RBC: 3.24 MIL/uL — ABNORMAL LOW (ref 4.20–5.80)
RDW: 15.4 % — ABNORMAL HIGH (ref 11.0–15.0)
WBC: 6.3 10*3/uL (ref 3.8–10.8)

## 2016-02-16 MED ORDER — ROSUVASTATIN CALCIUM 10 MG PO TABS: 10.0000 mg | ORAL_TABLET | Freq: Every day | ORAL | 6 refills | Status: DC

## 2016-02-16 NOTE — Progress Notes (Signed)
Subjective:    Patient ID: Christian Roberts, male    DOB: [birth year], 71 y.o.   MRN: [id number]  DOS:  02/16/2016 Type of visit - description : Hospital follow-up, no TCM Interval history: Admitted to the hospital 02-10-2016, discharged the next day.  The patient had upper and lower endoscopy 02/08/2011, many polyps were removed during the colonoscopy, he was discharged home, presented to the ER with large volume bright red blood per rectum, admitted to the hospital, he received 1 PRBC, at the time of discharge he was stable. Repeated colonoscopy 02/11/2016 - A few ulcers in the transverse colon. - A single (solitary) ulcer in the ascending colon at prior polypectomy site with 2 clips noted. 2 additional clips placed.   Review of Systems Since the hospital stay, he is doing well, appetite is coming back, he is tolerating food. No more blood in the stools, no  nausea, vomiting, abdominal pain. BMs daily No fever or chills. No constipation. Denies feeling dizzy, fainting or orthostatic Also was put back on Lipitor. See assessment and plan  Past Medical History:  Diagnosis Date  . Allergic rhinitis   . Asthma    "I outgrew it"  . Carpal tunnel syndrome   . Chronic rheumatic arthritis ([place]) 01/20/2014   Per Dr. Amil Amen   . Complete tear of rotator cuff 2016   Right, MRI in 2016, complete rupture of long head of the biceps as well as a tear of the subscapularis and supraspinatus tendons, Dr. Mardelle Matte  . Complication of anesthesia   . Constipation   . Diabetes mellitus    2012  . Elevated PSA   . GERD (gastroesophageal reflux disease)   . Headache(784.0)    headaches-related to sinuses & uses Tylenol   . Hemorrhoids   . Hernia   . Hyperlipidemia   . Nocturia   . Numbness of fingers   . Osteoarthritis    ankle- left, hands & neck & hip  . PONV (postoperative nausea and vomiting)    WITH ETHER  . Skin cancer, basal cell    sees derm    Past Surgical History:  Procedure  Laterality Date  . ANKLE FUSION Left 03-19-13   Dr Mardelle Matte  . ANKLE SURGERY Left   . APPENDECTOMY     1964  . BACK SURGERY     cerv. fusion C1- T1, 2 separate surgeries , T1-T3 2014  . CARPAL TUNNEL RELEASE Right   . CARPAL TUNNEL RELEASE Left 07-14-2014   [facility]  . COLONOSCOPY N/A 02/11/2016   Procedure: COLONOSCOPY;  Surgeon: Ladene Artist, MD;  Location: [facility];  Service: Endoscopy;  Laterality: N/A;  . CYSTOSCOPY     multiple times for infections  . INGUINAL HERNIA REPAIR  03/27/2011   Procedure: HERNIA REPAIR INGUINAL ADULT;  Surgeon: Harl Bowie, MD;  Location: [facility];  Service: General;  Laterality: Left;  . INGUINAL HERNIA REPAIR Bilateral 12/09/2012   Procedure: LAPAROSCOPIC BILATERAL INGUINAL HERNIA REPAIR WITH MESH;  Surgeon: Harl Bowie, MD;  Location: [facility];  Service: General;  Laterality: Bilateral;  . INSERTION OF MESH Bilateral 12/09/2012   Procedure: INSERTION OF MESH;  Surgeon: Harl Bowie, MD;  Location: [facility];  Service: General;  Laterality: Bilateral;  . left ankle mass removed  1972   3 surgeries on L ankle- 73, 2008, 2009  . NECK SURGERY  03/11,    C3-C7  . RECTAL SURGERY  at birth   "[place]" SURGICAL  CORRECTION  . ROTATOR CUFF REPAIR W/ DISTAL CLAVICLE EXCISION Right 08/25/2014   And acromioplasty, Dr. Mardelle Matte  . SHOULDER SURGERY  09/17/12  . TONSILLECTOMY    . VARICOCELECTOMY      Social History   Social History  . Marital status: Married    Spouse name: Arbie Cookey  . Number of children: 3  . Years of education: BA+   Occupational History  . 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 Smoking status: Current Every Day Smoker    Packs/day: 0.25    Types: Cigarettes    Last attempt to quit: 04/10/2012  . Smokeless tobacco: Never Used     Comment: quit on-off since 2014   . Alcohol use No  . Drug use: No  . Sexual activity: Not Currently   Other Topics Concern  . Not on file   Social History  Narrative   Married , lives w/ wife   Right-handed   Rare caffeine intake         Allergies as of 02/16/2016      Reactions   Aspirin Anaphylaxis   Codeine Phosphate Anaphylaxis   Ibuprofen Anaphylaxis   Levofloxacin Itching, Anaphylaxis   Menthol Swelling   Monosodium Glutamate Diarrhea, Nausea And Vomiting   Other reaction(s): Cramps (ALLERGY/intolerance)   Morphine Sulfate Anaphylaxis   Naproxen Sodium Anaphylaxis   Nsaids Anaphylaxis   Other Itching   **BAND-AID**   Sulfamethoxazole Anaphylaxis   Cymbalta [duloxetine Hcl] Other (See Comments)   Dizziness, syncope, and sleepiness       Medication List       Accurate as of 02/16/16 11:59 PM. Always use your most recent med list.          acetaminophen 500 MG tablet Commonly known as:  TYLENOL Take 500-1,000 mg by mouth every 4 (four) hours as needed. For pain   albuterol 108 (90 Base) MCG/ACT inhaler Commonly known as:  VENTOLIN HFA Inhale 2 puffs into the lungs every 6 (six) hours as needed for wheezing or shortness of breath.   BENADRYL 25 MG tablet Generic drug:  diphenhydrAMINE Take 25 mg by mouth every 6 (six) hours as needed for allergies.   BIOFREEZE ROLL-ON EX Apply 1 application topically daily as needed (pain).   DOCUSATE SODIUM PO Take 100 mg by mouth daily as needed (constipation). Reported on 01/26/2015   ENBREL 50 MG/ML injection Generic drug:  etanercept Inject 50 mg into the skin once a week.   esomeprazole 10 MG packet Commonly known as:  NEXIUM Take 10 mg by mouth daily before breakfast.   ferrous sulfate 325 (65 FE) MG tablet Take 1 tablet (325 mg total) by mouth 2 (two) times daily with a meal.   fish oil-omega-3 fatty acids 1000 MG capsule Take 1 g by mouth 2 (two) times daily.   fluticasone 50 MCG/ACT nasal spray Commonly known as:  FLONASE Place 1 spray into both nostrils daily.   folic acid 1 MG tablet Commonly known as:  FOLVITE Take 1 mg by mouth daily.   gabapentin 300  MG capsule Commonly known as:  NEURONTIN Take 1 capsule (300 mg total) by mouth 4 (four) times daily.   GAS-X PO Take 1 tablet by mouth 3 (three) times daily as needed (gas). Reported on 01/26/2015   glucose blood test strip Commonly known as:  FREESTYLE LITE Check blood sugar no more than twice daily.   hydrocortisone 2.5 % cream Apply 1 application topically daily as needed (  itching). Reported on 01/26/2015   ketoconazole 2 % cream Commonly known as:  NIZORAL Apply 1 application topically daily.   lidocaine 5 % Commonly known as:  LIDODERM Place 2 patches onto the skin daily.   METAMUCIL PO Take 1 tablet by mouth daily.   metFORMIN 1000 MG tablet Commonly known as:  GLUCOPHAGE Take 1 tablet (1,000 mg total) by mouth 2 (two) times daily with a meal.   Methotrexate (PF) 20 MG/0.4ML Soaj Inject into the skin. Every Thursday   MYRBETRIQ 50 MG Tb24 tablet Generic drug:  mirabegron ER Take 50 mg by mouth daily.   ONE TOUCH ULTRA SYSTEM KIT w/Device Kit Use device to check blood sugar.   PROCTOSOL HC 2.5 % rectal cream Generic drug:  hydrocortisone Place rectally 2 (two) times daily. Reported on 01/26/2015   rosuvastatin 10 MG tablet Commonly known as:  CRESTOR Take 1 tablet (10 mg total) by mouth daily.   tamsulosin 0.4 MG Caps capsule Commonly known as:  FLOMAX Take 0.4 mg by mouth daily after supper.   topiramate 100 MG tablet Commonly known as:  TOPAMAX Take 1 tablet (100 mg total) by mouth 2 (two) times daily.   verapamil 120 MG CR tablet Commonly known as:  CALAN-SR Take 1 tablet (120 mg total) by mouth at bedtime.   XIIDRA 5 % Soln Generic drug:  Lifitegrast Apply 1 application to eye daily.          Objective:   Physical Exam BP (!) 120/56 (BP Location: Left Arm, Patient Position: Sitting, Cuff Size: Small)   Pulse 96   Temp 97.9 F (36.6 C) (Oral)   Resp 12   Ht 5' 4"  (1.626 m)   Wt 134 lb 2 oz (60.8 kg)   SpO2 98%   BMI 23.02 kg/m    General:   Well developed, well nourished . NAD.  HEENT:  Normocephalic . Face symmetric, atraumatic. slt pale, no jaundice Lungs:  CTA B Normal respiratory effort, no intercostal retractions, no accessory muscle use. Heart: RRR,  no murmur.  no pretibial edema bilaterally  Abdomen:  Not distended, soft, non-tender. No rebound or rigidity.  Skin: Not pale. Not jaundice Neurologic:  alert & oriented X3.  Speech normal, gait appropriate for age and unassisted Psych--  Cognition and judgment appear intact.  Cooperative with normal attention span and concentration.  Behavior appropriate. No anxious or depressed appearing.    Assessment & Plan:   Assessment  DM 2012 Hyperlipidemia zocor  GERD Abnormal gallbladder per CT-- Saw surgery 12-16: No surgery rx MSK: ---Pain mngmt: reports a severe allergic reaction to  NSAIDs-codeine-MSO4 ; can tolerate oxycodone but the pain relief is just mild ; intolerant to Cymbalta 10-2015 ---Rheumatoid arthritis -- on enbrel - MTX (Dr Elpidio Galea) ---DJD multiple sites, cervical spondylosis -- gabapentin ---CTS ---neuropathy on-off, hands R>L Headaches --topamax GU: sees urology --Elevated PSA --BPH Skin cancer, BCC GI:  Iron deficiency, colonoscopy and EGD done 02/08/2016 (multiple colon polyps, EGD -- see report, bx mild chronic inflammation) Admitted with RBPR 02/10/2016 >> repeated Cscope  PLAN: GI bleed: Recovering from GI bleed, post-polypectomy, doing well clinically, recommend to continue iron, add a multivitamin, check a CBC. ER if alarming symptoms, see AVS. We'll see GI in few weeks. High cholesterol: Was on simvastatin, pharmacist at the hospital told him about the interaction between simvastatin and verapamil, although he had no clinical problem he is reluctant to go back on simvastatin. Plan: Trial with Crestor. RTC 04/2016 as scheduled.

## 2016-02-16 NOTE — Patient Instructions (Signed)
Please get your blood work  Stop atorvastatin, start Crestor 1 tablet at night  Continue taking iron and a multivitamin  Call anytime if you have his stomach pain, change in the color of the stools, fever, chills, nausea or vomiting  See you in April

## 2016-02-16 NOTE — Progress Notes (Signed)
Pre visit review using our clinic review tool, if applicable. No additional management support is needed unless otherwise documented below in the visit note. 

## 2016-02-18 NOTE — Assessment & Plan Note (Signed)
GI bleed: Recovering from GI bleed, post-polypectomy, doing well clinically, recommend to continue iron, add a multivitamin, check a CBC. ER if alarming symptoms, see AVS. We'll see GI in few weeks. High cholesterol: Was on simvastatin, pharmacist at the hospital told him about the interaction between simvastatin and verapamil, although he had no clinical problem he is reluctant to go back on simvastatin. Plan: Trial with Crestor. RTC 04/2016 as scheduled.

## 2016-02-19 ENCOUNTER — Ambulatory Visit: Payer: BLUE CROSS/BLUE SHIELD | Admitting: Nurse Practitioner

## 2016-02-20 ENCOUNTER — Ambulatory Visit: Payer: BLUE CROSS/BLUE SHIELD | Admitting: Nurse Practitioner

## 2016-02-21 ENCOUNTER — Telehealth: Payer: Self-pay | Admitting: Internal Medicine

## 2016-02-21 NOTE — Telephone Encounter (Signed)
Pt dropped off a letter to Dr. Larose Kells, in regards to a gym membership, document placed in tray at front office

## 2016-02-27 NOTE — Telephone Encounter (Signed)
Please type a letter.

## 2016-02-27 NOTE — Telephone Encounter (Signed)
Letter printed and signed, placed at front desk for pick up-Pt informed.

## 2016-02-28 ENCOUNTER — Encounter: Payer: BLUE CROSS/BLUE SHIELD | Admitting: Physical Medicine & Rehabilitation

## 2016-02-28 ENCOUNTER — Ambulatory Visit: Payer: BLUE CROSS/BLUE SHIELD | Admitting: Nurse Practitioner

## 2016-03-22 ENCOUNTER — Other Ambulatory Visit: Payer: Self-pay | Admitting: Gastroenterology

## 2016-03-22 DIAGNOSIS — D509 Iron deficiency anemia, unspecified: Secondary | ICD-10-CM

## 2016-03-22 DIAGNOSIS — K625 Hemorrhage of anus and rectum: Secondary | ICD-10-CM

## 2016-04-15 ENCOUNTER — Other Ambulatory Visit: Payer: Self-pay | Admitting: Internal Medicine

## 2016-04-17 ENCOUNTER — Telehealth: Payer: Self-pay | Admitting: Internal Medicine

## 2016-04-17 NOTE — Telephone Encounter (Signed)
Patient states since changing to rosuvastatin (CRESTOR) 10 MG tablet he has as an increase of leg cramps. States provider told him to let him know if this happens. He says its been like this since starting medication but did not report until now. Please advise.

## 2016-04-18 MED ORDER — PRAVASTATIN SODIUM 40 MG PO TABS: 40.0000 mg | ORAL_TABLET | Freq: Every day | ORAL | 3 refills | Status: DC

## 2016-04-18 NOTE — Telephone Encounter (Signed)
Stop Crestor today Will start Pravachol 40 mg in 2 weeks: 1 by mouth daily at bedtime #30 , 3 refills

## 2016-04-18 NOTE — Telephone Encounter (Signed)
Spoke w/ Pt, informed of recommendations, Rx sent to [facility] as requested.

## 2016-04-18 NOTE — Telephone Encounter (Signed)
Please advise 

## 2016-04-26 ENCOUNTER — Encounter: Payer: Self-pay | Admitting: Internal Medicine

## 2016-04-26 ENCOUNTER — Ambulatory Visit (INDEPENDENT_AMBULATORY_CARE_PROVIDER_SITE_OTHER): Payer: BLUE CROSS/BLUE SHIELD | Admitting: Internal Medicine

## 2016-04-26 VITALS — BP 116/72 | HR 70 | Temp 98.3°F | Resp 14 | Ht 64.0 in | Wt 129.1 lb

## 2016-04-26 DIAGNOSIS — E119 Type 2 diabetes mellitus without complications: Secondary | ICD-10-CM

## 2016-04-26 DIAGNOSIS — Z0000 Encounter for general adult medical examination without abnormal findings: Secondary | ICD-10-CM

## 2016-04-26 DIAGNOSIS — D509 Iron deficiency anemia, unspecified: Secondary | ICD-10-CM | POA: Diagnosis not present

## 2016-04-26 LAB — FERRITIN: Ferritin: 43.8 ng/mL (ref 22.0–322.0)

## 2016-04-26 LAB — COMPREHENSIVE METABOLIC PANEL
ALBUMIN: 4.2 g/dL (ref 3.5–5.2)
ALK PHOS: 52 U/L (ref 39–117)
ALT: 14 U/L (ref 0–53)
AST: 11 U/L (ref 0–37)
BILIRUBIN TOTAL: 0.5 mg/dL (ref 0.2–1.2)
BUN: 18 mg/dL (ref 6–23)
CALCIUM: 9.5 mg/dL (ref 8.4–10.5)
CO2: 28 mEq/L (ref 19–32)
CREATININE: 1.02 mg/dL (ref 0.40–1.50)
Chloride: 108 mEq/L (ref 96–112)
GFR: 76.85 mL/min (ref >60.00)
Glucose, Bld: 117 mg/dL — ABNORMAL HIGH (ref 70–99)
Potassium: 3.8 mEq/L (ref 3.5–5.1)
SODIUM: 141 meq/L (ref 135–145)
TOTAL PROTEIN: 6.3 g/dL (ref 6.0–8.3)

## 2016-04-26 LAB — CBC WITH DIFFERENTIAL/PLATELET
BASOS ABS: 0.1 10*3/uL (ref 0.0–0.1)
Basophils Relative: 1 % (ref 0.0–3.0)
EOS ABS: 0.2 10*3/uL (ref 0.0–0.7)
Eosinophils Relative: 3.7 % (ref 0.0–5.0)
HCT: 36.1 % — ABNORMAL LOW (ref 39.0–52.0)
Hemoglobin: 11.8 g/dL — ABNORMAL LOW (ref 13.0–17.0)
LYMPHS PCT: 28.4 % (ref 12.0–46.0)
Lymphs Abs: 1.9 10*3/uL (ref 0.7–4.0)
MCHC: 32.8 g/dL (ref 30.0–36.0)
MCV: 92.2 fl (ref 78.0–100.0)
MONO ABS: 0.7 10*3/uL (ref 0.1–1.0)
Monocytes Relative: 11.2 % (ref 3.0–12.0)
Neutro Abs: 3.7 10*3/uL (ref 1.4–7.7)
Neutrophils Relative %: 55.7 % (ref 43.0–77.0)
Platelets: 327 10*3/uL (ref 150.0–400.0)
RBC: 3.91 Mil/uL — AB (ref 4.22–5.81)
RDW: 15.1 % (ref 11.5–15.5)
WBC: 6.6 10*3/uL (ref 4.0–10.5)

## 2016-04-26 LAB — MICROALBUMIN / CREATININE URINE RATIO
CREATININE, U: 128.2 mg/dL
MICROALB UR: 2.5 mg/dL — AB (ref 0.0–1.9)
MICROALB/CREAT RATIO: 2 mg/g (ref 0.0–30.0)

## 2016-04-26 LAB — HEMOGLOBIN A1C: HEMOGLOBIN A1C: 6.8 % — AB (ref 4.6–6.5)

## 2016-04-26 LAB — IRON: Iron: 26 ug/dL — ABNORMAL LOW (ref 42–165)

## 2016-04-26 NOTE — Assessment & Plan Note (Addendum)
Td 2009; Zostavax 2013; shingrex discussed, if he decides to proceed, will discuss with  rheumatology. Pneumonia shot 2013;Prevnar 2015 --CCS: Colonoscopy 2002, diverticuli, random biopsies were done. Flex sigmoidoscopy 2006, + hemorrhoids, Dr. Deatra Ina Cscope 02-1016, had post polypectomy bleed, next per GI --PSAs per urology --DEXA: rec to discuss w/ Dr Elpidio Galea  --Diet  Discussed --Labs: CMP, CBC, iron, ferritin, A1c, micro

## 2016-04-26 NOTE — Progress Notes (Signed)
Subjective:    Patient ID: Christian Roberts, male    DOB: October 12, 1945, 71 y.o.   MRN: [id number]  DOS:  04/26/2016 Type of visit - description : CPX Interval history: No major events since the last visit    Review of Systems Occasional eye discharge, to see the eye doctor today One-day history of loose stools without nausea, vomiting, blood in the stools. No abdominal pain. BPH symptoms still there, at baseline. No gross hematuria. He stopped tobacco 02-2016. He feels very thirsty and has increased appetite, these are chronic issues.   Other than above, a 14 point review of systems is negative    Past Medical History:  Diagnosis Date  . Allergic rhinitis   . Asthma    "I outgrew it"  . Carpal tunnel syndrome   . Chronic rheumatic arthritis ([place]) 01/20/2014   Per Dr. Amil Amen   . Complete tear of rotator cuff 2016   Right, MRI in 2016, complete rupture of long head of the biceps as well as a tear of the subscapularis and supraspinatus tendons, Dr. Mardelle Matte  . Complication of anesthesia   . Constipation   . Diabetes mellitus    2012  . Elevated PSA   . GERD (gastroesophageal reflux disease)   . Headache(784.0)    headaches-related to sinuses & uses Tylenol   . Hemorrhoids   . Hernia   . Hyperlipidemia   . Nocturia   . Numbness of fingers   . Osteoarthritis    ankle- left, hands & neck & hip  . PONV (postoperative nausea and vomiting)    WITH ETHER  . Skin cancer, basal cell    sees derm    Past Surgical History:  Procedure Laterality Date  . ANKLE FUSION Left 03-19-13   Dr Mardelle Matte  . ANKLE SURGERY Left   . APPENDECTOMY     1964  . BACK SURGERY     cerv. fusion C1- T1, 2 separate surgeries , T1-T3 2014  . CARPAL TUNNEL RELEASE Right   . CARPAL TUNNEL RELEASE Left 07-14-2014   [facility]  . COLONOSCOPY N/A 02/11/2016   Procedure: COLONOSCOPY;  Surgeon: Ladene Artist, MD;  [facility];  Service: Endoscopy;  Laterality: N/A;  . CYSTOSCOPY     multiple times  for infections  . INGUINAL HERNIA REPAIR  03/27/2011   Procedure: HERNIA REPAIR INGUINAL ADULT;  Surgeon: Harl Bowie, MD;  Location: [place];  Service: General;  Laterality: Left;  . INGUINAL HERNIA REPAIR Bilateral 12/09/2012   Procedure: LAPAROSCOPIC BILATERAL INGUINAL HERNIA REPAIR WITH MESH;  Surgeon: Harl Bowie, MD;  [facility]: [facility];  Service: General;  Laterality: Bilateral;  . INSERTION OF MESH Bilateral 12/09/2012   Procedure: INSERTION OF MESH;  Surgeon: Harl Bowie, MD;  [facility]: [facility];  Service: General;  Laterality: Bilateral;  . left ankle mass removed  1972   3 surgeries on L ankle- 73, 2008, 2009  . NECK SURGERY  03/11,    C3-C7  . RECTAL SURGERY  at birth   "[place]" SURGICAL CORRECTION  . ROTATOR CUFF REPAIR W/ DISTAL CLAVICLE EXCISION Right 08/25/2014   And acromioplasty, Dr. Mardelle Matte  . SHOULDER SURGERY  09/17/12  . TONSILLECTOMY    . VARICOCELECTOMY      Social History   Social History  . Marital status: Married    Spouse name: Arbie Cookey  . Number of children: 3  . Years of education: BA+   Occupational History  .  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 Smoking status: Current Every Day Smoker    Packs/day: 0.25    Types: Cigarettes    Last attempt to quit: 04/10/2012  . Smokeless tobacco: Never Used     Comment: no since 02-2016  . Alcohol use No  . Drug use: No  . Sexual activity: Not Currently   Other Topics Concern  . Not on file   Social History Narrative   Married , lives w/ wife   Right-handed   Rare caffeine intake        Family History  Problem Relation Age of Onset  . Stroke Mother   . Anesthesia problems Mother   . Cerebral aneurysm Father   . Skin cancer Father     melanoma  . Stroke Father   . Diabetes Sister     hypoglycemia  . Breast cancer Sister     x 2  . Lung cancer Sister   . Atrial fibrillation Sister   . Lung cancer Sister   . Emphysema Sister   . Atrial fibrillation  Sister   . Colon cancer Neg Hx   . Prostate cancer Neg Hx   . Colon polyps Neg Hx   . Esophageal cancer Neg Hx   . Rectal cancer Neg Hx   . Stomach cancer Neg Hx      Allergies as of 04/26/2016      Reactions   Aspirin Anaphylaxis   Codeine Phosphate Anaphylaxis   Ibuprofen Anaphylaxis   Levofloxacin Itching, Anaphylaxis   Menthol Swelling   Monosodium Glutamate Diarrhea, Nausea And Vomiting   Other reaction(s): Cramps (ALLERGY/intolerance)   Morphine Sulfate Anaphylaxis   Naproxen Sodium Anaphylaxis   Nsaids Anaphylaxis   Other Itching   **BAND-AID**   Sulfamethoxazole Anaphylaxis   Cymbalta [duloxetine Hcl] Other (See Comments)   Dizziness, syncope, and sleepiness       Medication List       Accurate as of 04/26/16  1:01 PM. Always use your most recent med list.          acetaminophen 500 MG tablet Commonly known as:  TYLENOL Take 500-1,000 mg by mouth every 4 (four) hours as needed. For pain   albuterol 108 (90 Base) MCG/ACT inhaler Commonly known as:  VENTOLIN HFA Inhale 2 puffs into the lungs every 6 (six) hours as needed for wheezing or shortness of breath.   BENADRYL 25 MG tablet Generic drug:  diphenhydrAMINE Take 25 mg by mouth every 6 (six) hours as needed for allergies.   BIOFREEZE ROLL-ON EX Apply 1 application topically daily as needed (pain).   DOCUSATE SODIUM PO Take 100 mg by mouth daily as needed (constipation). Reported on 01/26/2015   ENBREL 50 MG/ML injection Generic drug:  etanercept Inject 50 mg into the skin once a week.   esomeprazole 10 MG packet Commonly known as:  NEXIUM Take 10 mg by mouth daily before breakfast.   ferrous sulfate 325 (65 FE) MG tablet TAKE 1 TABLET (325 MG TOTAL) BY MOUTH 2 (TWO) TIMES DAILY WITH A MEAL.   fish oil-omega-3 fatty acids 1000 MG capsule Take 1 g by mouth 2 (two) times daily.   fluticasone 50 MCG/ACT nasal spray Commonly known as:  FLONASE Place 1 spray into both nostrils daily.   folic  acid 1 MG tablet Commonly known as:  FOLVITE Take 1 mg by mouth daily.   gabapentin 300 MG capsule Commonly known as:  NEURONTIN Take 1 capsule (  300 mg total) by mouth 4 (four) times daily.   GAS-X PO Take 1 tablet by mouth 3 (three) times daily as needed (gas). Reported on 01/26/2015   glucose blood test strip Commonly known as:  FREESTYLE LITE Check blood sugar no more than twice daily.   hydrocortisone 2.5 % cream Apply 1 application topically daily as needed (itching). Reported on 01/26/2015   ketoconazole 2 % cream Commonly known as:  NIZORAL Apply 1 application topically daily.   lidocaine 5 % Commonly known as:  LIDODERM Place 2 patches onto the skin daily.   METAMUCIL PO Take 1 tablet by mouth daily.   metFORMIN 1000 MG tablet Commonly known as:  GLUCOPHAGE Take 1 tablet (1,000 mg total) by mouth 2 (two) times daily with a meal.   Methotrexate (PF) 20 MG/0.4ML Soaj Inject into the skin. Every Thursday   MYRBETRIQ 50 MG Tb24 tablet Generic drug:  mirabegron ER Take 50 mg by mouth daily.   ONE TOUCH ULTRA SYSTEM KIT w/Device Kit Use device to check blood sugar.   pravastatin 40 MG tablet Commonly known as:  PRAVACHOL Take 1 tablet (40 mg total) by mouth at bedtime.   PROCTOSOL HC 2.5 % rectal cream Generic drug:  hydrocortisone Place rectally 2 (two) times daily. Reported on 01/26/2015   tamsulosin 0.4 MG Caps capsule Commonly known as:  FLOMAX Take 0.4 mg by mouth daily after supper.   topiramate 100 MG tablet Commonly known as:  TOPAMAX Take 1 tablet (100 mg total) by mouth 2 (two) times daily.   verapamil 120 MG CR tablet Commonly known as:  CALAN-SR Take 1 tablet (120 mg total) by mouth at bedtime.   XIIDRA 5 % Soln Generic drug:  Lifitegrast Apply 1 application to eye daily.          Objective:   Physical Exam BP 116/72 (BP Location: Left Arm, Patient Position: Sitting, Cuff Size: Small)   Pulse 70   Temp 98.3 F (36.8 C) (Oral)    Resp 14   Ht 5' 4"  (1.626 m)   Wt 129 lb 2 oz (58.6 kg)   SpO2 95%   BMI 22.16 kg/m  General:   Well developed, well nourished . NAD.  Neck: No  thyromegaly  HEENT:  Normocephalic . Face symmetric, atraumatic Lungs:  CTA B Normal respiratory effort, no intercostal retractions, no accessory muscle use. Heart: RRR,  no murmur.  No pretibial edema bilaterally  Abdomen:  Not distended, soft, non-tender. No rebound or rigidity.  Slight increased bowel sounds. Skin: Exposed areas without rash. Not pale. Not jaundice DIABETIC FEET EXAM: No lower extremity edema Normal pedal pulses bilaterally Skin normal, nails normal, no calluses Pinprick examination of the feet normal. Neurologic:  alert & oriented X3.  Speech normal, gait today is very limited by pain, unassisted, able to transfer to the table with  difficulty but by himself Strength symmetric and appropriate for age.  Psych: Cognition and judgment appear intact.  Cooperative with normal attention span and concentration.  Behavior appropriate. No anxious or depressed appearing.     Assessment & Plan:   Assessment  DM 2012 Hyperlipidemia zocor  GERD h/o bronchospasm Abnormal gallbladder per CT-- Saw surgery 12-16: No surgery rx MSK: ---Pain mngmt: reports a severe allergic reaction to  NSAIDs-codeine-MSO4 ; can tolerate oxycodone but the pain relief is just mild ; intolerant to Cymbalta 10-2015 ---Rheumatoid arthritis -- on enbrel - MTX (Dr Elpidio Galea) ---DJD multiple sites, cervical spondylosis -- gabapentin ---CTS ---neuropathy on-off, hands  R>L Headaches --topamax, verapamil started 12-2015 w/very good results GU: sees urology --Elevated PSA --BPH Skin cancer, BCC GI:  Iron deficiency, colonoscopy and EGD done 02/08/2016 (multiple colon polyps, EGD -- see report, bx mild chronic inflammation) Admitted with RBPR 02/10/2016 >> repeated Cscope  PLAN: DM: On metformin, check A1c and micro. Foot exam normal today.     hyperlipidemia: Crestor prescribed 02/25/2016, he reported more MSK problems, holding it since 04/17/2016, symptoms are about the same, has not started Pravachol. Recommend to continue holding the statin's and start Pravachol in 3-4 weeks. Anemia: Check a CBC. Iron, ferritin. Z18- folic acid normal 02-988 Tobacco: Quit 02-2016, praised! RTC 4 months

## 2016-04-26 NOTE — Assessment & Plan Note (Signed)
DM: On metformin, check A1c and micro. Foot exam normal today.   hyperlipidemia: Crestor prescribed 02/25/2016, he reported more MSK problems, holding it since 04/17/2016, symptoms are about the same, has not started Pravachol. Recommend to continue holding the statin's and start Pravachol in 3-4 weeks. Anemia: Check a CBC. Iron, ferritin. Y43- folic acid normal [id number] Tobacco: Quit 02-2016, praised! RTC 4 months

## 2016-04-26 NOTE — Progress Notes (Signed)
Pre visit review using our clinic review tool, if applicable. No additional management support is needed unless otherwise documented below in the visit note. 

## 2016-04-26 NOTE — Patient Instructions (Signed)
Get your blood work before you leave    Next visit in 4 months  Start pravachol  in 3-4 weeks from today. Watch for side effects.  Consider immunization called [facility]  Discussed the option of doing a bone density tests with your rheumatologist      Fall Prevention and Home Safety Falls cause injuries and can affect all age groups. It is possible to use preventive measures to significantly decrease the likelihood of falls. There are many simple measures which can make your home safer and prevent falls. OUTDOORS  Repair cracks and edges of walkways and driveways.  Remove high doorway thresholds.  Trim shrubbery on the main path into your home.  Have good outside lighting.  Clear walkways of tools, rocks, debris, and clutter.  Check that handrails are not broken and are securely fastened. Both sides of steps should have handrails.  Have leaves, snow, and ice cleared regularly.  Use sand or salt on walkways during winter months.  In the garage, clean up grease or oil spills. BATHROOM  Install night lights.  Install grab bars by the toilet and in the tub and shower.  Use non-skid mats or decals in the tub or shower.  Place a plastic non-slip stool in the shower to sit on, if needed.  Keep floors dry and clean up all water on the floor immediately.  Remove soap buildup in the tub or shower on a regular basis.  Secure bath mats with non-slip, double-sided rug tape.  Remove throw rugs and tripping hazards from the floors. BEDROOMS  Install night lights.  Make sure a bedside light is easy to reach.  Do not use oversized bedding.  Keep a telephone by your bedside.  Have a firm chair with side arms to use for getting dressed.  Remove throw rugs and tripping hazards from the floor. KITCHEN  Keep handles on pots and pans turned toward the center of the stove. Use back burners when possible.  Clean up spills quickly and allow time for drying.  Avoid walking  on wet floors.  Avoid hot utensils and knives.  Position shelves so they are not too high or low.  Place commonly used objects within easy reach.  If necessary, use a sturdy step stool with a grab bar when reaching.  Keep electrical cables out of the way.  Do not use floor polish or wax that makes floors slippery. If you must use wax, use non-skid floor wax.  Remove throw rugs and tripping hazards from the floor. STAIRWAYS  Never leave objects on stairs.  Place handrails on both sides of stairways and use them. Fix any loose handrails. Make sure handrails on both sides of the stairways are as long as the stairs.  Check carpeting to make sure it is firmly attached along stairs. Make repairs to worn or loose carpet promptly.  Avoid placing throw rugs at the top or bottom of stairways, or properly secure the rug with carpet tape to prevent slippage. Get rid of throw rugs, if possible.  Have an electrician put in a light switch at the top and bottom of the stairs. OTHER FALL PREVENTION TIPS  Wear low-heel or rubber-soled shoes that are supportive and fit well. Wear closed toe shoes.  When using a stepladder, make sure it is fully opened and both spreaders are firmly locked. Do not climb a closed stepladder.  Add color or contrast paint or tape to grab bars and handrails in your home. Place contrasting color strips on  first and last steps.  Learn and use mobility aids as needed. Install an electrical emergency response system.  Turn on lights to avoid dark areas. Replace light bulbs that burn out immediately. Get light switches that glow.  Arrange furniture to create clear pathways. Keep furniture in the same place.  Firmly attach carpet with non-skid or double-sided tape.  Eliminate uneven floor surfaces.  Select a carpet pattern that does not visually hide the edge of steps.  Be aware of all pets. OTHER HOME SAFETY TIPS  Set the water temperature for 120 F (48.8  C).  Keep emergency numbers on or near the telephone.  Keep smoke detectors on every level of the home and near sleeping areas. Document Released: 12/14/2001 Document Revised: 06/25/2011 Document Reviewed: 03/15/2011 [facility] Patient Information 2015 [place], [place]. This information is not intended to replace advice given to you by your health care provider. Make sure you discuss any questions you have with your health care provider.   Preventive Care for Adults Ages 58 and over  Blood pressure check.** / Every 1 to 2 years.  Lipid and cholesterol check.**/ Every 5 years beginning at age 48.  Lung cancer screening. / Every year if you are aged 80-80 years and have a 30-pack-year history of smoking and currently smoke or have quit within the past 15 years. Yearly screening is stopped once you have quit smoking for at least 15 years or develop a health problem that would prevent you from having lung cancer treatment.  Fecal occult blood test (FOBT) of stool. / Every year beginning at age 60 and continuing until age 56. You may not have to do this test if you get a colonoscopy every 10 years.  Flexible sigmoidoscopy** or colonoscopy.** / Every 5 years for a flexible sigmoidoscopy or every 10 years for a colonoscopy beginning at age 64 and continuing until age 39.  Hepatitis C blood test.** / For all people born from [birth year] through 1964 and any individual with known risks for hepatitis C.  Abdominal aortic aneurysm (AAA) screening.** / A one-time screening for ages 19 to 75 years who are current or former smokers.  Skin self-exam. / Monthly.  Influenza vaccine. / Every year.  Tetanus, diphtheria, and acellular pertussis (Tdap/Td) vaccine.** / 1 dose of Td every 10 years.  Varicella vaccine.** / Consult your health care provider.  Zoster vaccine.** / 1 dose for adults aged 55 years or older.  Pneumococcal 13-valent conjugate (PCV13) vaccine.** / Consult your health care  provider.  Pneumococcal polysaccharide (PPSV23) vaccine.** / 1 dose for all adults aged 18 years and older.  Meningococcal vaccine.** / Consult your health care provider.  Hepatitis A vaccine.** / Consult your health care provider.  Hepatitis B vaccine.** / Consult your health care provider.  Haemophilus influenzae type b (Hib) vaccine.** / Consult your health care provider. **Family history and personal history of risk and conditions may change your health care provider's recommendations. Document Released: 02/19/2001 Document Revised: 12/29/2012 Document Reviewed: 05/21/2010 [facility] Patient Information 2015 [place], [place]. This information is not intended to replace advice given to you by your health care provider. Make sure you discuss any questions you have with your health care provider.

## 2016-05-01 ENCOUNTER — Encounter: Payer: BLUE CROSS/BLUE SHIELD | Admitting: Internal Medicine

## 2016-05-03 ENCOUNTER — Telehealth: Payer: Self-pay | Admitting: Internal Medicine

## 2016-05-03 NOTE — Telephone Encounter (Signed)
Notes recorded by Damita Dunnings, CMA on 05/01/2016 at 7:42 AM EDT Letter printed and mailed to Pt. ------  Notes recorded by Colon Branch, MD on 04/30/2016 at 5:59 PM EDT Send a letter Bobetta Lime, your diabetes remains well controlled, your anemia is improving, other tests are satisfactory. Continue same medications

## 2016-05-03 NOTE — Telephone Encounter (Signed)
Relation to pt: self Call back [phone]   Reason for call:   Patient inquiring about lab results.   Patient wanted to inform PCP he was seen by Dr. Leigh Aurora 05/02/16 and specialist placed bone density orders.

## 2016-05-05 ENCOUNTER — Other Ambulatory Visit: Payer: Self-pay | Admitting: Internal Medicine

## 2016-05-13 ENCOUNTER — Telehealth: Payer: Self-pay | Admitting: Internal Medicine

## 2016-05-13 DIAGNOSIS — D509 Iron deficiency anemia, unspecified: Secondary | ICD-10-CM

## 2016-05-13 DIAGNOSIS — K625 Hemorrhage of anus and rectum: Secondary | ICD-10-CM

## 2016-05-13 MED ORDER — FERROUS SULFATE 325 (65 FE) MG PO TABS: 325.0000 mg | ORAL_TABLET | Freq: Two times a day (BID) | ORAL | 1 refills | Status: AC

## 2016-05-13 NOTE — Telephone Encounter (Signed)
Advised patient message below and would like to speak with nurse,please advise

## 2016-05-13 NOTE — Telephone Encounter (Signed)
Pt will need to contact Dr. Havery Moros (GI) for refills.

## 2016-05-13 NOTE — Telephone Encounter (Signed)
°  Relation to OZ:DGUY Call back number:[phone] Pharmacy: [facility], [place]  Reason for call:  Patient requesting 90 day supply 180 pills for ferrous sulfate 325 (65 FE) MG tablet, please advise

## 2016-05-13 NOTE — Telephone Encounter (Signed)
Patient states he never received letter and would like a phone call, please advise

## 2016-05-13 NOTE — Telephone Encounter (Signed)
Informed of labs-will re-mail them to Pt.

## 2016-05-13 NOTE — Telephone Encounter (Signed)
Rx sent 

## 2016-06-08 ENCOUNTER — Other Ambulatory Visit: Payer: Self-pay | Admitting: Gastroenterology

## 2016-06-08 DIAGNOSIS — D509 Iron deficiency anemia, unspecified: Secondary | ICD-10-CM

## 2016-06-08 DIAGNOSIS — K625 Hemorrhage of anus and rectum: Secondary | ICD-10-CM

## 2016-06-10 NOTE — Telephone Encounter (Signed)
Yes we can refill it but I would like him to have a CBC, TIBC, and ferritin drawn to see where his blood counts are trending. If he remains anemic / iron deficient despite oral iron, he may need IV iron and would also recommend a capsule study. Can you please relay this to him and ask him to go to the lab for blood draw, and place orders? thanks

## 2016-06-10 NOTE — Telephone Encounter (Signed)
Do you want to see this pt in clinic or have labs before giving pt a refill?

## 2016-06-11 ENCOUNTER — Telehealth: Payer: Self-pay | Admitting: Gastroenterology

## 2016-06-11 NOTE — Telephone Encounter (Signed)
Patient for last several weeks has had numerous, soft stools per day. He has tried imodium occasionally, without much success. Patient states it's not watery, he has not been on any antibiotics. Patient has follow up visit here at the end of the month, but wanted to know what he could try in the meantime.

## 2016-06-11 NOTE — Telephone Encounter (Signed)
Patient advised of recommendations to take imodium 1-2 tablets daily, as he has not consistently tried this. Let him know that this issue could be from his metformin too. Advised patient that if not better that he could call back, and at his office visit can follow up on this.

## 2016-06-11 NOTE — Telephone Encounter (Signed)
I reviewed his medication list, which is extensive. Not sure how long he has been on metformin, which can cause this. How much immodium has he tried? He can take 1-2 tabs each morning to see if this helps. If he has already tried multiple tablets per day, then we can consider something else, but if he is only taking it rarely as needed, he can try daily to see if this helps until his clinic visit. Thanks

## 2016-06-12 ENCOUNTER — Other Ambulatory Visit: Payer: Self-pay

## 2016-06-12 DIAGNOSIS — D509 Iron deficiency anemia, unspecified: Secondary | ICD-10-CM

## 2016-06-12 NOTE — Telephone Encounter (Signed)
Pt states that his ins does not cover po iron supplement. He has been taking OTC iron 325mg  BID. Pt informed that he needs to have labs and we will call him with results. Pt understood. Orders made for CBC, Ferritin and TIBC. Pt will come within the week for blood draw.

## 2016-06-17 ENCOUNTER — Telehealth: Payer: Self-pay | Admitting: Internal Medicine

## 2016-06-17 NOTE — Telephone Encounter (Signed)
Pt dropped off health care provider authorization form, pt is requesting document is faxed, documents placed in tray at front desk

## 2016-06-20 ENCOUNTER — Other Ambulatory Visit (INDEPENDENT_AMBULATORY_CARE_PROVIDER_SITE_OTHER): Payer: BLUE CROSS/BLUE SHIELD

## 2016-06-20 DIAGNOSIS — D509 Iron deficiency anemia, unspecified: Secondary | ICD-10-CM | POA: Diagnosis not present

## 2016-06-20 LAB — CBC WITH DIFFERENTIAL/PLATELET
BASOS PCT: 0.9 % (ref 0.0–3.0)
Basophils Absolute: 0.1 10*3/uL (ref 0.0–0.1)
Eosinophils Absolute: 0.1 10*3/uL (ref 0.0–0.7)
Eosinophils Relative: 1.7 % (ref 0.0–5.0)
HEMATOCRIT: 35.7 % — AB (ref 39.0–52.0)
Hemoglobin: 12 g/dL — ABNORMAL LOW (ref 13.0–17.0)
LYMPHS PCT: 26.2 % (ref 12.0–46.0)
Lymphs Abs: 2.1 10*3/uL (ref 0.7–4.0)
MCHC: 33.5 g/dL (ref 30.0–36.0)
MCV: 91.6 fl (ref 78.0–100.0)
MONOS PCT: 8.2 % (ref 3.0–12.0)
Monocytes Absolute: 0.7 10*3/uL (ref 0.1–1.0)
NEUTROS ABS: 5.1 10*3/uL (ref 1.4–7.7)
Neutrophils Relative %: 63 % (ref 43.0–77.0)
PLATELETS: 307 10*3/uL (ref 150.0–400.0)
RBC: 3.9 Mil/uL — ABNORMAL LOW (ref 4.22–5.81)
RDW: 15.4 % (ref 11.5–15.5)
WBC: 8.1 10*3/uL (ref 4.0–10.5)

## 2016-06-20 LAB — IRON AND TIBC
%SAT: 25 % (ref 15–60)
Iron: 77 ug/dL (ref 50–180)
TIBC: 304 ug/dL (ref 250–425)
UIBC: 227 ug/dL

## 2016-06-20 LAB — FERRITIN: Ferritin: 54.8 ng/mL (ref 22.0–322.0)

## 2016-06-20 NOTE — Telephone Encounter (Signed)
Received NRT form- Pt allergic to adhesive tape, bandaids. He will be unable to use nicotine patches but can use nicotine gum or lozenges. Form signed by PCP and faxed to [phone]. Form sent for scanning.

## 2016-07-04 ENCOUNTER — Encounter: Payer: Self-pay | Admitting: Gastroenterology

## 2016-07-04 ENCOUNTER — Other Ambulatory Visit (INDEPENDENT_AMBULATORY_CARE_PROVIDER_SITE_OTHER): Payer: BLUE CROSS/BLUE SHIELD

## 2016-07-04 ENCOUNTER — Ambulatory Visit (INDEPENDENT_AMBULATORY_CARE_PROVIDER_SITE_OTHER): Payer: BLUE CROSS/BLUE SHIELD | Admitting: Gastroenterology

## 2016-07-04 VITALS — BP 118/66 | HR 56 | Ht 62.0 in | Wt 128.5 lb

## 2016-07-04 DIAGNOSIS — D649 Anemia, unspecified: Secondary | ICD-10-CM

## 2016-07-04 DIAGNOSIS — K649 Unspecified hemorrhoids: Secondary | ICD-10-CM

## 2016-07-04 DIAGNOSIS — R194 Change in bowel habit: Secondary | ICD-10-CM

## 2016-07-04 DIAGNOSIS — Z86010 Personal history of colonic polyps: Secondary | ICD-10-CM

## 2016-07-04 LAB — IGA: IgA: 101 mg/dL (ref 68–378)

## 2016-07-04 LAB — TESTOSTERONE: Testosterone: 301.68 ng/dL (ref 300.00–890.00)

## 2016-07-04 NOTE — Patient Instructions (Signed)
If you are age 70 or older, your body mass index should be between 23-30. Your Body mass index is 23.5 kg/m. If this is out of the aforementioned range listed, please consider follow up with your Primary Care Provider.  If you are age 58 or younger, your body mass index should be between 19-25. Your Body mass index is 23.5 kg/m. If this is out of the aformentioned range listed, please consider follow up with your Primary Care Provider.   Your physician has requested that you go to the basement for the following lab work before leaving today:  Testosterone, Stool C Diff, Celiac Labs  If your stool test is negative for C diff you may start taking Immodium as needed.  You have been given a Low FodMap diet to follow.  Please call us back when you are ready to schedule your colonoscopy in August.  Thank you.

## 2016-07-04 NOTE — Progress Notes (Signed)
HPI :  71 y/o male here to follow up with me for history of anemia, polyps, and rectal bleeding. He was initially seen in January for anemia and rectal bleeding. He was referred for colonoscopy and EGD as outlined below.  EGD 02/08/16 - mildly nodular stomach, exam otherwise normal - biopsies unremarkable Colonoscopy 02/08/16 - 15 polyps removed, including 2-2.5cm ascending colon polyp removed - clips placed - mostly all polyps adenomas - recall recommended for 6-9 months Colonoscopy 02/11/16 - for bleeding at polypectomy site - clip placed at site of largest polypectomy for visible vessel   Over time his anemia has improved - Hgb baseline 11s previously, now at 12, with normal MCV and iron studies have normalized on iron therapy. He reports ongoing occasional rectal bleeding from hemorrhoids. He is having roughly upwards of 10 BMs per day on a bad day, and then he states some days he may not have any bowel movements at all, and then sometimes has loose stools. He thinks this has been ongoing for a few weeks, is relatively new and was not bothering him during his recent colonoscopies. He  thinks the loose stools has been making his hemorrhoids worse. He reports red blood on the toilet paper when wiping himself, usually not in the stools. No recent antibiotics use. No fevers. He states he is trying to figure out a "diet" which may work for him. He has taken a rare immodium. He does endorse some chronic fatigue.   Anemia dates back to 2014 from the chart - normocytic, prior B12 / folate levels normal, TSH  CT scan 12/21/14 - no evidence of small bowel pathology,   Past Medical History:  Diagnosis Date  . Allergic rhinitis   . Asthma    "I outgrew it"  . Carpal tunnel syndrome   . Chronic rheumatic arthritis ([place]) 01/20/2014   Per Dr. Amil Amen   . Complete tear of rotator cuff 2016   Right, MRI in 2016, complete rupture of long head of the biceps as well as a tear of the subscapularis and  supraspinatus tendons, Dr. Mardelle Matte  . Complication of anesthesia   . Constipation   . Diabetes mellitus    2012  . Elevated PSA   . GERD (gastroesophageal reflux disease)   . Headache(784.0)    headaches-related to sinuses & uses Tylenol   . Hemorrhoids   . Hernia   . Hyperlipidemia   . Nocturia   . Numbness of fingers   . Osteoarthritis    ankle- left, hands & neck & hip  . PONV (postoperative nausea and vomiting)    WITH ETHER  . Skin cancer, basal cell    sees derm     Past Surgical History:  Procedure Laterality Date  . ANKLE FUSION Left 03-19-13   Dr Mardelle Matte  . ANKLE SURGERY Left   . APPENDECTOMY     1964  . BACK SURGERY     cerv. fusion C1- T1, 2 separate surgeries , T1-T3 2014  . CARPAL TUNNEL RELEASE Right   . CARPAL TUNNEL RELEASE Left 07-14-2014   [facility]  . COLONOSCOPY N/A 02/11/2016   Procedure: COLONOSCOPY;  Surgeon: Ladene Artist, MD;  Location: [facility];  Service: Endoscopy;  Laterality: N/A;  . CYSTOSCOPY     multiple times for infections  . INGUINAL HERNIA REPAIR  03/27/2011   Procedure: HERNIA REPAIR INGUINAL ADULT;  Surgeon: Harl Bowie, MD;  Location: [facility];  Service: General;  Laterality: Left;  . INGUINAL  HERNIA REPAIR Bilateral 12/09/2012   Procedure: LAPAROSCOPIC BILATERAL INGUINAL HERNIA REPAIR WITH MESH;  Surgeon: Harl Bowie, MD;  Location: [facility];  Service: General;  Laterality: Bilateral;  . INSERTION OF MESH Bilateral 12/09/2012   Procedure: INSERTION OF MESH;  Surgeon: Harl Bowie, MD;  Location: [facility];  Service: General;  Laterality: Bilateral;  . left ankle mass removed  1972   3 surgeries on L ankle- 73, 2008, 2009  . NECK SURGERY  03/11,    C3-C7  . RECTAL SURGERY  at birth   "[place]" SURGICAL CORRECTION  . ROTATOR CUFF REPAIR W/ DISTAL CLAVICLE EXCISION Right 08/25/2014   And acromioplasty, Dr. Mardelle Matte  . SHOULDER SURGERY  09/17/12  . TONSILLECTOMY    . VARICOCELECTOMY     Family History    Problem Relation Age of Onset  . Stroke Mother   . Anesthesia problems Mother   . Cerebral aneurysm Father   . Skin cancer Father        melanoma  . Stroke Father   . Diabetes Sister        hypoglycemia  . Breast cancer Sister        x 2  . Lung cancer Sister   . Atrial fibrillation Sister   . Lung cancer Sister   . Emphysema Sister   . Atrial fibrillation Sister   . Colon cancer Neg Hx   . Prostate cancer Neg Hx   . Colon polyps Neg Hx   . Esophageal cancer Neg Hx   . Rectal cancer Neg Hx   . Stomach cancer Neg Hx    Social History  Substance Use Topics  . Smoking status: Current Every Day Smoker    Packs/day: 0.25    Types: Cigarettes    Last attempt to quit: 04/10/2012  . Smokeless tobacco: Never Used     Comment: no since 02-2016  . Alcohol use No   Current Outpatient Prescriptions  Medication Sig Dispense Refill  . acetaminophen (TYLENOL) 500 MG tablet Take 500-1,000 mg by mouth every 4 (four) hours as needed. For pain    . albuterol (VENTOLIN HFA) 108 (90 BASE) MCG/ACT inhaler Inhale 2 puffs into the lungs every 6 (six) hours as needed for wheezing or shortness of breath. 1 Inhaler 1  . Blood Glucose Monitoring Suppl (ONE TOUCH ULTRA SYSTEM KIT) W/DEVICE KIT Use device to check blood sugar. 1 each 0  . cycloSPORINE (RESTASIS) 0.05 % ophthalmic emulsion 1 drop 2 (two) times daily.    . diphenhydrAMINE (BENADRYL) 25 MG tablet Take 25 mg by mouth every 6 (six) hours as needed for allergies.     . DOCUSATE SODIUM PO Take 100 mg by mouth daily as needed (constipation). Reported on 01/26/2015    . esomeprazole (NEXIUM) 10 MG packet Take 10 mg by mouth daily before breakfast.    . etanercept (ENBREL) 50 MG/ML injection Inject 50 mg into the skin once a week.    . ferrous sulfate 325 (65 FE) MG tablet Take 1 tablet (325 mg total) by mouth 2 (two) times daily with a meal. 180 tablet 1  . fish oil-omega-3 fatty acids 1000 MG capsule Take 1 g by mouth 2 (two) times daily.     .  fluticasone (FLONASE) 50 MCG/ACT nasal spray Place 1 spray into both nostrils daily.    . folic acid (FOLVITE) 1 MG tablet Take 1 mg by mouth daily.    Marland Kitchen gabapentin (NEURONTIN) 300 MG capsule Take  1 capsule (300 mg total) by mouth 4 (four) times daily. 360 capsule 1  . glucose blood (FREESTYLE LITE) test strip Check blood sugar no more than twice daily. 100 each 12  . hydrocortisone 2.5 % cream Apply 1 application topically daily as needed (itching). Reported on 01/26/2015    . ketoconazole (NIZORAL) 2 % cream Apply 1 application topically daily. 30 g 1  . lidocaine (LIDODERM) 5 % Place 2 patches onto the skin daily. (Patient taking differently: Place 2 patches onto the skin daily as needed (pain). ) 90 patch 3  . Menthol, Topical Analgesic, (BIOFREEZE ROLL-ON EX) Apply 1 application topically daily as needed (pain).     . metFORMIN (GLUCOPHAGE) 1000 MG tablet Take 1 tablet (1,000 mg total) by mouth 2 (two) times daily with a meal. 180 tablet 1  . Methotrexate, PF, 20 MG/0.4ML SOAJ Inject into the skin. Every Thursday    . MYRBETRIQ 50 MG TB24 tablet Take 50 mg by mouth daily.     . pravastatin (PRAVACHOL) 40 MG tablet Take 1 tablet (40 mg total) by mouth at bedtime. 30 tablet 3  . PROCTOSOL HC 2.5 % rectal cream Place rectally 2 (two) times daily. Reported on 01/26/2015 30 g 2  . Psyllium (METAMUCIL PO) Take 1 tablet by mouth daily.     . Simethicone (GAS-X PO) Take 1 tablet by mouth 3 (three) times daily as needed (gas). Reported on 01/26/2015    . Tamsulosin HCl (FLOMAX) 0.4 MG CAPS Take 0.4 mg by mouth daily after supper.     . topiramate (TOPAMAX) 100 MG tablet Take 1 tablet (100 mg total) by mouth 2 (two) times daily. 180 tablet 1  . verapamil (CALAN-SR) 120 MG CR tablet Take 1 tablet (120 mg total) by mouth at bedtime. 90 tablet 1   Current Facility-Administered Medications  Medication Dose Route Frequency Provider Last Rate Last Dose  . 0.9 %  sodium chloride infusion  500 mL Intravenous  Continuous Nasiah Polinsky, Renelda Loma, MD      . 0.9 %  sodium chloride infusion  500 mL Intravenous Continuous Desirey Keahey, Renelda Loma, MD       Allergies  Allergen Reactions  . Aspirin Anaphylaxis  . Codeine Phosphate Anaphylaxis  . Ibuprofen Anaphylaxis  . Levofloxacin Itching and Anaphylaxis  . Menthol Swelling  . Monosodium Glutamate Diarrhea and Nausea And Vomiting    Other reaction(s): Cramps (ALLERGY/intolerance)  . Morphine Sulfate Anaphylaxis  . Naproxen Sodium Anaphylaxis  . Nsaids Anaphylaxis  . Other Itching    **BAND-AID**  . Sulfamethoxazole Anaphylaxis  . Cymbalta [Duloxetine Hcl] Other (See Comments)    Dizziness, syncope, and sleepiness      Review of Systems: All systems reviewed and negative except where noted in HPI.   CBC Latest Ref Rng & Units 06/20/2016 04/26/2016 02/16/2016  WBC 4.0 - 10.5 K/uL 8.1 6.6 6.3  Hemoglobin 13.0 - 17.0 g/dL 12.0(L) 11.8(L) 9.5(L)  Hematocrit 39.0 - 52.0 % 35.7(L) 36.1(L) 29.4(L)  Platelets 150.0 - 400.0 K/uL 307.0 327.0 329    Lab Results  Component Value Date   IRON 77 06/20/2016   TIBC 304 06/20/2016   FERRITIN 54.8 06/20/2016     Physical Exam: BP 118/66   Pulse (!) 56   Ht 5' 2"  (1.575 m)   Wt 128 lb 8 oz (58.3 kg)   BMI 23.50 kg/m  Constitutional: Pleasant,well-developed, male in no acute distress. HEENT: Normocephalic and atraumatic. Conjunctivae are normal. No scleral icterus. Neck supple.  Cardiovascular: Normal  rate, regular rhythm.  Pulmonary/chest: Effort normal and breath sounds normal. No wheezing, rales or rhonchi. Abdominal: Soft, nondistended, nontender.  There are no masses palpable. No hepatomegaly. Extremities: no edema Lymphadenopathy: No cervical adenopathy noted. Neurological: Alert and oriented to person place and time. Skin: Skin is warm and dry. No rashes noted. Psychiatric: Normal mood and affect. Behavior is normal.   ASSESSMENT AND PLAN: 71 year old male here for reassessment  following issues:  Anemia - this could be multifactorial. Previously had borderline low ferritin but normal iron. Normocytic anemia at least since 2014 B-12, folate, TSH have been normal. His iron stores have normalized with iron however his anemia persists. Prior CT abdomen did not show any small bowel pathology in the setting of this anemia. He has some scant rectal bleeding from hemorrhoids in the setting of diarrhea however he has other possible etiologies for his anemia. He has ongoing fatigue, I'm going to check his testosterone level to ensure normal. I will also screen for celiac disease labs although prior endoscopy did not show changes concerning for this. If his anemia persists without clear etiology, given iron studies now normal, we may consider referral to hematology.  Change in bowel habits - relatively recent development of diarrhea. We'll send stools for C. difficile to ensure negative. If they're negative he can use Imodium as needed, and counseled him a low FODMAP diet see if this helps.  History of colon adenomas - 15 polyps removed on recent colonoscopy, one large, moved in piecemeal which was complicated by post polypectomy bleed unfortunately. He is due for surveillance colonoscopy about 6 months after his last exam. He wants to proceed with this exam summer time. I discussed risks and benefits and he wanted to proceed.  [place] Cellar, MD [facility] Pager [phone]

## 2016-07-05 ENCOUNTER — Other Ambulatory Visit: Payer: BLUE CROSS/BLUE SHIELD

## 2016-07-05 DIAGNOSIS — K649 Unspecified hemorrhoids: Secondary | ICD-10-CM

## 2016-07-05 DIAGNOSIS — D649 Anemia, unspecified: Secondary | ICD-10-CM

## 2016-07-05 DIAGNOSIS — R194 Change in bowel habit: Secondary | ICD-10-CM

## 2016-07-05 DIAGNOSIS — Z86010 Personal history of colonic polyps: Secondary | ICD-10-CM

## 2016-07-05 LAB — TISSUE TRANSGLUTAMINASE, IGA: Tissue Transglutaminase Ab, IgA: 2 U/mL (ref <4)

## 2016-07-06 LAB — C. DIFFICILE GDH AND TOXIN A/B
C. DIFF TOXIN A/B: NOT DETECTED
C. DIFFICILE GDH: NOT DETECTED

## 2016-07-08 ENCOUNTER — Other Ambulatory Visit: Payer: Self-pay

## 2016-07-08 DIAGNOSIS — D649 Anemia, unspecified: Secondary | ICD-10-CM

## 2016-07-11 LAB — PSA: PSA: 7.65

## 2016-07-18 LAB — HM DEXA SCAN

## 2016-07-25 LAB — BASIC METABOLIC PANEL
BUN: 13 (ref 4–21)
CREATININE: 1 (ref 0.6–1.3)
Glucose: 111
POTASSIUM: 4.2 (ref 3.4–5.3)
Sodium: 143 (ref 137–147)

## 2016-07-25 LAB — HEPATIC FUNCTION PANEL
ALK PHOS: 56 (ref 25–125)
ALT: 16 (ref 10–40)
AST: 19 (ref 14–40)
Bilirubin, Total: 0.3

## 2016-07-29 ENCOUNTER — Telehealth: Payer: Self-pay | Admitting: Gastroenterology

## 2016-07-29 NOTE — Telephone Encounter (Signed)
Thanks, Yes he was referred on 7/2. Christian Roberts can you check status of hematology referral. He is due for a colonoscopy in light of the polyps removed during last colonoscopy, I think we should schedule that and can evaluate his hemorrhoids at that time, and may consider banding. HE should try hydrocortizone suppositories if he hasn't yet (I think he was previously given these). He can stop the low FODMAP diet if not helping. thanks

## 2016-07-29 NOTE — Telephone Encounter (Signed)
Christian Roberts, please check on this referral to hematology, looks like it was in the system on 7/2. Dr. Havery Moros, I tried to reach patient on the phone, unable to leave voice message. Sounds like he is still having trouble. Thanks.

## 2016-07-30 ENCOUNTER — Encounter: Payer: Self-pay | Admitting: Internal Medicine

## 2016-07-30 ENCOUNTER — Telehealth: Payer: Self-pay | Admitting: Hematology and Oncology

## 2016-07-30 ENCOUNTER — Encounter: Payer: Self-pay | Admitting: Hematology and Oncology

## 2016-07-30 NOTE — Telephone Encounter (Signed)
Pt has an appt scheduled with Dr. Lebron Conners at the [facility] It is booked for 08/08/16 and pt needs t arrive at 10:30am. They will mail him an info packet.  I gave pt their number, street address and the name of the physician he would be seeing. Pt understood.

## 2016-07-30 NOTE — Telephone Encounter (Signed)
Appt has been scheduled for the pt to see Dr. Lebron Conners on 8/2 at [facility] from Dr. Doyne Keel office will notify the pt. Letter mailed.

## 2016-07-30 NOTE — Telephone Encounter (Signed)
Patient is scheduled for colonoscopy at the end of August. He will try hydrocortizone suppositories for the hemorrhoid bleeding. I have instructed him to stop the FODMAP diet to see if this will put some weight back on him. He is also a diabetic, asked him to contact who ever manages his diabetes to see if they can recommend a nutritional supplement, hopefully one that will be gentle on his GI system. He states he did this before to help gain weight, but had a lot of diarrhea. Dr. Havery Moros, if possible, I would like to get him in sooner than 8/30 for his colonoscopy, would you mind looking at your schedule. Thank you.

## 2016-07-31 NOTE — Telephone Encounter (Signed)
Almyra Free, you can add him at 0730 case on August 1 for colonoscopy if he wants do it sooner. thanks

## 2016-07-31 NOTE — Telephone Encounter (Signed)
Left message for patient to call back, I can work him in sooner for pre-visit and colonoscopy if he would like to reschedule.

## 2016-07-31 NOTE — Telephone Encounter (Signed)
Spoke to patient he is rescheduled for pre-visit and colonoscopy.

## 2016-08-02 ENCOUNTER — Ambulatory Visit (AMBULATORY_SURGERY_CENTER): Payer: Self-pay

## 2016-08-02 VITALS — Ht 63.5 in | Wt 124.6 lb

## 2016-08-02 DIAGNOSIS — D649 Anemia, unspecified: Secondary | ICD-10-CM

## 2016-08-02 MED ORDER — NA SULFATE-K SULFATE-MG SULF 17.5-3.13-1.6 GM/177ML PO SOLN: 1.0000 | Freq: Once | ORAL | 0 refills | Status: AC

## 2016-08-02 NOTE — Progress Notes (Signed)
Denies allergies to eggs or soy products. Denies complication of anesthesia or sedation. Denies use of weight loss medication. Denies use of O2.   Emmi instructions given for colonoscopy.  

## 2016-08-07 ENCOUNTER — Encounter: Payer: Self-pay | Admitting: Gastroenterology

## 2016-08-07 ENCOUNTER — Ambulatory Visit (AMBULATORY_SURGERY_CENTER): Payer: BLUE CROSS/BLUE SHIELD | Admitting: Gastroenterology

## 2016-08-07 VITALS — BP 126/73 | HR 76 | Temp 98.0°F | Resp 14 | Ht 62.0 in | Wt 128.0 lb

## 2016-08-07 DIAGNOSIS — D125 Benign neoplasm of sigmoid colon: Secondary | ICD-10-CM

## 2016-08-07 DIAGNOSIS — D508 Other iron deficiency anemias: Secondary | ICD-10-CM

## 2016-08-07 DIAGNOSIS — D122 Benign neoplasm of ascending colon: Secondary | ICD-10-CM | POA: Diagnosis not present

## 2016-08-07 DIAGNOSIS — Z86010 Personal history of colonic polyps: Secondary | ICD-10-CM | POA: Diagnosis not present

## 2016-08-07 DIAGNOSIS — D123 Benign neoplasm of transverse colon: Secondary | ICD-10-CM

## 2016-08-07 MED ORDER — SODIUM CHLORIDE 0.9 % IV SOLN: 500.0000 mL | INTRAVENOUS | Status: AC

## 2016-08-07 NOTE — Op Note (Signed)
[facility] Patient Name: Christian Roberts Procedure Date: 08/07/2016 7:40 AM MRN: [id number] Endoscopist: Remo Lipps P. Julitza Rickles MD, MD Age: 71 Referring MD:  Date of Birth: 01-20-1945 Gender: Male Account #: [id number] Procedure:                Colonoscopy Indications:              High risk colon cancer surveillance: Personal                            history of colonic polyps - 15 adenomas removed on                            last colonoscopy, one large adenoma removed in                            piecemeal Medicines:                Monitored Anesthesia Care Procedure:                Pre-Anesthesia Assessment:                           - Prior to the procedure, a History and Physical                            was performed, and patient medications and                            allergies were reviewed. The patient's tolerance of                            previous anesthesia was also reviewed. The risks                            and benefits of the procedure and the sedation                            options and risks were discussed with the patient.                            All questions were answered, and informed consent                            was obtained. Prior Anticoagulants: The patient has                            taken no previous anticoagulant or antiplatelet                            agents. ASA Grade Assessment: II - A patient with                            mild systemic disease. After reviewing the risks  and benefits, the patient was deemed in                            satisfactory condition to undergo the procedure.                           After obtaining informed consent, the colonoscope                            was passed under direct vision. Throughout the                            procedure, the patient's blood pressure, pulse, and                            oxygen saturations were monitored continuously. The                             Colonoscope was introduced through the anus and                            advanced to the the terminal ileum, with                            identification of the appendiceal orifice and IC                            valve. The colonoscopy was performed without                            difficulty. The patient tolerated the procedure                            well. The quality of the bowel preparation was                            adequate. The terminal ileum, ileocecal valve,                            appendiceal orifice, and rectum were photographed. Scope In: 7:43:32 AM Scope Out: 8:07:20 AM Scope Withdrawal Time: 0 hours 18 minutes 22 seconds  Total Procedure Duration: 0 hours 23 minutes 48 seconds  Findings:                 The perianal exam findings include non-thrombosed                            external hemorrhoids.                           The terminal ileum appeared normal.                           Multiple medium-mouthed diverticula were found in  the sigmoid colon.                           A large post polypectomy scar was found in the                            ascending colon. The scar tissue was healthy in                            appearance with one focal area of a small                            nodularity - I suspect this was normal scar /                            granulation tissue, no obvious polypoid tissue, it                            was removed with cold snare..                           A 3 mm polyp was found in the hepatic flexure. The                            polyp was sessile. The polyp was removed with a                            cold biopsy forceps. Resection and retrieval were                            complete.                           A 5 mm polyp was found in the sigmoid colon. The                            polyp was sessile. The polyp was removed with a                             cold snare. Resection and retrieval were complete.                           Internal hemorrhoids were found during retroflexion.                           The colon was tortous and left colon did not retain                            air well. The exam was otherwise without                            abnormality. Complications:            No immediate complications. Estimated blood  loss:                            Minimal. Estimated Blood Loss:     Estimated blood loss was minimal. Impression:               - Non-thrombosed external hemorrhoids found on                            perianal exam.                           - The examined portion of the ileum was normal.                           - Diverticulosis in the sigmoid colon.                           - Post-polypectomy scar in the ascending colon as                            outlined above - no obvious residual polyp, one                            small area removed with cold snare.                           - One 3 mm polyp at the hepatic flexure, removed                            with a cold biopsy forceps. Resected and retrieved.                           - One 5 mm polyp in the sigmoid colon, removed with                            a cold snare. Resected and retrieved.                           - Internal hemorrhoids.                           - Tortous colon                           - The examination was otherwise normal. Recommendation:           - Patient has a contact number available for                            emergencies. The signs and symptoms of potential                            delayed complications were discussed with the  patient. Return to normal activities tomorrow.                            Written discharge instructions were provided to the                            patient.                           - Resume previous diet.                           - Continue present  medications.                           - Await pathology results.                           - Repeat colonoscopy is recommended for                            surveillance. The colonoscopy date will be                            determined after pathology results from today's                            exam become available for review.                           - No ibuprofen, naproxen, or other non-steroidal                            anti-inflammatory drugs for 2 weeks after polyp                            removal. Remo Lipps P. Liel Rudden MD, MD 08/07/2016 8:13:57 AM This report has been signed electronically.

## 2016-08-07 NOTE — Patient Instructions (Signed)
Handouts given on polyps and diverticulosis  YOU HAD AN ENDOSCOPIC PROCEDURE TODAY: Refer to the procedure report and other information in the discharge instructions given to you for any specific questions about what was found during the examination. If this information does not answer your questions, please call [facility] office at [phone] to clarify.   YOU SHOULD EXPECT: Some feelings of bloating in the abdomen. Passage of more gas than usual. Walking can help get rid of the air that was put into your GI tract during the procedure and reduce the bloating. If you had a lower endoscopy (such as a colonoscopy or flexible sigmoidoscopy) you may notice spotting of blood in your stool or on the toilet paper. Some abdominal soreness may be present for a day or two, also.  DIET: Your first meal following the procedure should be a light meal and then it is ok to progress to your normal diet. A half-sandwich or bowl of soup is an example of a good first meal. Heavy or fried foods are harder to digest and may make you feel nauseous or bloated. Drink plenty of fluids but you should avoid alcoholic beverages for 24 hours. If you had a esophageal dilation, please see attached instructions for diet.    ACTIVITY: Your care partner should take you home directly after the procedure. You should plan to take it easy, moving slowly for the rest of the day. You can resume normal activity the day after the procedure however YOU SHOULD NOT DRIVE, use power tools, machinery or perform tasks that involve climbing or major physical exertion for 24 hours (because of the sedation medicines used during the test).   SYMPTOMS TO REPORT IMMEDIATELY: A gastroenterologist can be reached at any hour. Please call [phone]  for any of the following symptoms:  Following lower endoscopy (colonoscopy, flexible sigmoidoscopy) Excessive amounts of blood in the stool  Significant tenderness, worsening of abdominal pains  Swelling of  the abdomen that is new, acute  Fever of 100 or higher    FOLLOW UP:  If any biopsies were taken you will be contacted by phone or by letter within the next 1-3 weeks. Call [phone]  if you have not heard about the biopsies in 3 weeks.  Please also call with any specific questions about appointments or follow up tests.  

## 2016-08-07 NOTE — Progress Notes (Signed)
Called to room to assist during endoscopic procedure.  Patient ID and intended procedure confirmed with present staff. Received instructions for my participation in the procedure from the performing physician.  

## 2016-08-07 NOTE — Progress Notes (Signed)
Patient awakening,vss,report to rn 

## 2016-08-08 ENCOUNTER — Encounter: Payer: BLUE CROSS/BLUE SHIELD | Admitting: Hematology and Oncology

## 2016-08-08 ENCOUNTER — Telehealth: Payer: Self-pay | Admitting: *Deleted

## 2016-08-08 NOTE — Telephone Encounter (Signed)
  Follow up Call-  Call back number 08/07/2016 02/08/2016  Post procedure Call Back phone  # [phone]  Permission to leave phone message Yes Yes  Some recent data might be hidden     Patient questions:  Do you have a fever, pain , or abdominal swelling? No. Pain Score  0 *  Have you tolerated food without any problems? Yes.    Have you been able to return to your normal activities? Yes.    Do you have any questions about your discharge instructions: Diet   No. Medications  No. Follow up visit  No.  Do you have questions or concerns about your Care? Yes.   Patient's only remark was the large Bruising he had on his right forearm from the "IV site." No pain just bruising today. SM Actions: * If pain score is 4 or above: No action needed, pain <4.

## 2016-08-08 NOTE — Telephone Encounter (Signed)
No answer for call back will attempt to call back later this afternoon. SM

## 2016-08-10 ENCOUNTER — Other Ambulatory Visit: Payer: Self-pay | Admitting: Internal Medicine

## 2016-08-13 ENCOUNTER — Ambulatory Visit (INDEPENDENT_AMBULATORY_CARE_PROVIDER_SITE_OTHER): Payer: BLUE CROSS/BLUE SHIELD | Admitting: Adult Health

## 2016-08-13 ENCOUNTER — Encounter: Payer: Self-pay | Admitting: Adult Health

## 2016-08-13 ENCOUNTER — Encounter: Payer: Self-pay | Admitting: Gastroenterology

## 2016-08-13 VITALS — BP 94/52 | HR 86 | Ht 63.5 in | Wt 123.4 lb

## 2016-08-13 DIAGNOSIS — R519 Headache, unspecified: Secondary | ICD-10-CM

## 2016-08-13 DIAGNOSIS — R51 Headache: Secondary | ICD-10-CM

## 2016-08-13 DIAGNOSIS — G8929 Other chronic pain: Secondary | ICD-10-CM

## 2016-08-13 NOTE — Progress Notes (Signed)
PATIENT: Christian Roberts DOB: 06/09/1945  REASON FOR VISIT: follow up- headaches HISTORY FROM: patient  HISTORY OF PRESENT ILLNESS: Today 08/13/16 Christian Roberts is a 71 year old male with a history of headaches. He returns today for follow-up. He reports that since he started verapamil his headaches have resolved. He has continued on Topamax 100 mg twice a day. He reports that he has continued to suffer from anemia. He's had significant weight loss for which he has been following with his primary care provider. He states he is seeing a hematologist this Friday for ongoing anemia. The patient states that he has not had any headaches. He is tolerating the medication well. He denies any new neurological symptoms. He does report that he plans to move to [place] within the next 3-4 months.  HISTORY 02/06/16: Christian Roberts is a 71 year old male with a history of left-sided headaches. He returns today for follow-up. He reports that his primary care placed him on verapamil. He states that since he started this medication his headaches have almost resolved. He states that there are days he feels as if he may get a headache but it never comes. He states that if he does get a headache he uses lidocaine nasal spray with good benefit. He continues to use Topamax and Maxalt. Patient advised that with his left-sided headaches he was also having lacrimation in the left eye and nasal congestion on the left side. The patient had never reported this previously. He denies any new neurological symptoms. He returns today for an evaluation.  HISTORY 11/02/15: Christian Roberts a 71 year old male with a history of left-sided headaches. He returns today for follow-up. He reports that he has about 2 weeks out of a month that he has a headache off and on. He states that when the headache starts he does take Maxalt and it resolves fairly quickly however the next day be headache returns. He states that he only takes Maxalt 2 days in a  row and then will not take any additional tablets as he does not want a rebound headache. He states the headache is always located on the left side of the head. He has mild photophobia but denies phonophobia. He does have nausea but no vomiting. He was placed on Cymbalta by his primary care however he reports he is not able to tolerate this. He is on Topamax 200 mg daily. He returns today for an evaluation.   REVIEW OF SYSTEMS: Out of a complete 14 system review of symptoms, the patient complains only of the following symptoms, and all other reviewed systems are negative.  Fatigue, eye itching, excessive thirst, drooling, ringing in ears, joint pain, joint swelling, back pain, aching muscles, muscle cramps, neck pain, neck stiffness, moles, agitation, weakness, dizziness, memory loss, anemia  ALLERGIES: Allergies  Allergen Reactions  . Aspirin Anaphylaxis  . Codeine Phosphate Anaphylaxis  . Ibuprofen Anaphylaxis  . Levofloxacin Itching and Anaphylaxis  . Menthol Swelling  . Monosodium Glutamate Diarrhea and Nausea And Vomiting    Other reaction(s): Cramps (ALLERGY/intolerance)  . Morphine Sulfate Anaphylaxis  . Naproxen Sodium Anaphylaxis  . Nsaids Anaphylaxis  . Other Itching    **BAND-AID**  . Sulfamethoxazole Anaphylaxis  . Cymbalta [Duloxetine Hcl] Other (See Comments)    Dizziness, syncope, and sleepiness     HOME MEDICATIONS: Outpatient Medications Prior to Visit  Medication Sig Dispense Refill  . acetaminophen (TYLENOL) 500 MG tablet Take 500-1,000 mg by mouth every 4 (four) hours as needed. For pain    .  albuterol (VENTOLIN HFA) 108 (90 BASE) MCG/ACT inhaler Inhale 2 puffs into the lungs every 6 (six) hours as needed for wheezing or shortness of breath. 1 Inhaler 1  . Blood Glucose Monitoring Suppl (ONE TOUCH ULTRA SYSTEM KIT) W/DEVICE KIT Use device to check blood sugar. 1 each 0  . cycloSPORINE (RESTASIS) 0.05 % ophthalmic emulsion 1 drop 2 (two) times daily.    .  diphenhydrAMINE (BENADRYL) 25 MG tablet Take 25 mg by mouth every 6 (six) hours as needed for allergies.     . DOCUSATE SODIUM PO Take 100 mg by mouth daily as needed (constipation). Reported on 01/26/2015    . esomeprazole (NEXIUM) 10 MG packet Take 10 mg by mouth daily before breakfast.    . etanercept (ENBREL) 50 MG/ML injection Inject 50 mg into the skin once a week.    . ferrous sulfate 325 (65 FE) MG tablet Take 1 tablet (325 mg total) by mouth 2 (two) times daily with a meal. 180 tablet 1  . fish oil-omega-3 fatty acids 1000 MG capsule Take 1 g by mouth 2 (two) times daily.     . fluticasone (FLONASE) 50 MCG/ACT nasal spray Place 1 spray into both nostrils daily.    . folic acid (FOLVITE) 1 MG tablet Take 1 mg by mouth daily.    Marland Kitchen gabapentin (NEURONTIN) 300 MG capsule Take 1 capsule (300 mg total) by mouth 4 (four) times daily. 360 capsule 1  . glucose blood (FREESTYLE LITE) test strip Check blood sugar no more than twice daily. 100 each 12  . hydrocortisone 2.5 % cream Apply 1 application topically daily as needed (itching). Reported on 01/26/2015    . ketoconazole (NIZORAL) 2 % cream Apply 1 application topically daily. 30 g 1  . lidocaine (LIDODERM) 5 % Place 2 patches onto the skin daily. (Patient taking differently: Place 2 patches onto the skin daily as needed (pain). ) 90 patch 3  . Menthol, Topical Analgesic, (BIOFREEZE ROLL-ON EX) Apply 1 application topically daily as needed (pain).     . metFORMIN (GLUCOPHAGE) 1000 MG tablet Take 1 tablet (1,000 mg total) by mouth 2 (two) times daily with a meal. 180 tablet 1  . Methotrexate, PF, 20 MG/0.4ML SOAJ Inject into the skin. Every Thursday    . MYRBETRIQ 50 MG TB24 tablet Take 50 mg by mouth daily.     . pravastatin (PRAVACHOL) 40 MG tablet Take 1 tablet (40 mg total) by mouth at bedtime. 30 tablet 3  . PROCTOSOL HC 2.5 % rectal cream Place rectally 2 (two) times daily. Reported on 01/26/2015 30 g 2  . Psyllium (METAMUCIL PO) Take 1 tablet  by mouth daily.     . Simethicone (GAS-X PO) Take 1 tablet by mouth 3 (three) times daily as needed (gas). Reported on 01/26/2015    . Tamsulosin HCl (FLOMAX) 0.4 MG CAPS Take 0.4 mg by mouth daily after supper.     . topiramate (TOPAMAX) 100 MG tablet Take 1 tablet (100 mg total) by mouth 2 (two) times daily. 180 tablet 1  . verapamil (CALAN-SR) 120 MG CR tablet Take 1 tablet (120 mg total) by mouth at bedtime. 90 tablet 1   Facility-Administered Medications Prior to Visit  Medication Dose Route Frequency Provider Last Rate Last Dose  . 0.9 %  sodium chloride infusion  500 mL Intravenous Continuous Armbruster, Renelda Loma, MD        PAST MEDICAL HISTORY: Past Medical History:  Diagnosis Date  . Allergic rhinitis   .  Anemia   . Asthma    "I outgrew it"  . Blood transfusion without reported diagnosis   . Carpal tunnel syndrome   . Cataract   . Chronic rheumatic arthritis ([place]) 01/20/2014   Per Dr. Amil Amen   . Complete tear of rotator cuff 2016   Right, MRI in 2016, complete rupture of long head of the biceps as well as a tear of the subscapularis and supraspinatus tendons, Dr. Mardelle Matte  . Complication of anesthesia   . Constipation   . Diabetes mellitus    2012  . Elevated PSA   . GERD (gastroesophageal reflux disease)   . Headache(784.0)    headaches-related to sinuses & uses Tylenol   . Hemorrhoids   . Hernia   . Hyperlipidemia   . Nocturia   . Numbness of fingers   . Osteoarthritis    ankle- left, hands & neck & hip  . PONV (postoperative nausea and vomiting)    WITH ETHER  . Skin cancer, basal cell    sees derm    PAST SURGICAL HISTORY: Past Surgical History:  Procedure Laterality Date  . ANKLE FUSION Left 03-19-13   Dr Mardelle Matte  . ANKLE SURGERY Left   . APPENDECTOMY     1964  . BACK SURGERY     cerv. fusion C1- T1, 2 separate surgeries , T1-T3 2014  . CARPAL TUNNEL RELEASE Right   . CARPAL TUNNEL RELEASE Left 07-14-2014   [facility]  . COLONOSCOPY N/A 02/11/2016    Procedure: COLONOSCOPY;  Surgeon: Ladene Artist, MD;  Location: [facility];  Service: Endoscopy;  Laterality: N/A;  . CYSTOSCOPY     multiple times for infections  . INGUINAL HERNIA REPAIR  03/27/2011   Procedure: HERNIA REPAIR INGUINAL ADULT;  Surgeon: Harl Bowie, MD;  Location: [facility];  Service: General;  Laterality: Left;  . INGUINAL HERNIA REPAIR Bilateral 12/09/2012   Procedure: LAPAROSCOPIC BILATERAL INGUINAL HERNIA REPAIR WITH MESH;  Surgeon: Harl Bowie, MD;  Location: [facility];  Service: General;  Laterality: Bilateral;  . INSERTION OF MESH Bilateral 12/09/2012   Procedure: INSERTION OF MESH;  Surgeon: Harl Bowie, MD;  Location: [facility];  Service: General;  Laterality: Bilateral;  . left ankle mass removed  1972   3 surgeries on L ankle- 73, 2008, 2009  . NECK SURGERY  03/11,    C3-C7  . RECTAL SURGERY  at birth   "[place]" SURGICAL CORRECTION  . ROTATOR CUFF REPAIR W/ DISTAL CLAVICLE EXCISION Right 08/25/2014   And acromioplasty, Dr. Mardelle Matte  . SHOULDER SURGERY  09/17/12  . TONSILLECTOMY    . VARICOCELECTOMY      FAMILY HISTORY: Family History  Problem Relation Age of Onset  . Stroke Mother   . Anesthesia problems Mother   . Cerebral aneurysm Father   . Skin cancer Father        melanoma  . Stroke Father   . Diabetes Sister        hypoglycemia  . Breast cancer Sister        x 2  . Lung cancer Sister   . Atrial fibrillation Sister   . Lung cancer Sister   . Emphysema Sister   . Atrial fibrillation Sister   . Colon cancer Neg Hx   . Prostate cancer Neg Hx   . Colon polyps Neg Hx   . Esophageal cancer Neg Hx   . Rectal cancer Neg Hx   . Stomach cancer Neg Hx  SOCIAL HISTORY: Social History   Social History  . Marital status: Married    Spouse name: Arbie Cookey  . Number of children: 3  . Years of education: BA+   Occupational History  . 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 Smoking status: Current Every  Day Smoker    Packs/day: 0.25    Types: Cigarettes    Last attempt to quit: 04/10/2012  . Smokeless tobacco: Never Used     Comment: no since 02-2016  . Alcohol use No  . Drug use: No  . Sexual activity: Not Currently   Other Topics Concern  . Not on file   Social History Narrative   Married , lives w/ wife   Right-handed   Rare caffeine intake         PHYSICAL EXAM  Vitals:   08/13/16 1414  BP: (!) 94/52  Pulse: 86  Weight: 123 lb 6.4 oz (56 kg)  Height: 5' 3.5" (1.613 m)   Body mass index is 21.52 kg/m.  Generalized: Well developed, in no acute distress   Neurological examination  Mentation: Alert. Follows all commands speech and language fluent Cranial nerve II-XII: Pupils were equal round reactive to light. Extraocular movements were full, visual field were full on confrontational test. Facial sensation and strength were normal. Uvula tongue midline. Head turning and shoulder shrug  were normal and symmetric. Motor: The motor testing reveals 5 over 5 strength of all 4 extremities. Good symmetric motor tone is noted throughout.  Sensory: Sensory testing is intact to soft touch on all 4 extremities. No evidence of extinction is noted.  Coordination: Cerebellar testing reveals good finger-nose-finger and heel-to-shin bilaterally.  Gait and station: Gait is normal.  Reflexes: Deep tendon reflexes are symmetric and normal bilaterally.   DIAGNOSTIC DATA (LABS, IMAGING, TESTING) - I reviewed patient records, labs, notes, testing and imaging myself where available.  Lab Results  Component Value Date   WBC 8.1 06/20/2016   HGB 12.0 (L) 06/20/2016   HCT 35.7 (L) 06/20/2016   MCV 91.6 06/20/2016   PLT 307.0 06/20/2016      Component Value Date/Time   NA 143 07/25/2016   K 4.2 07/25/2016   CL 108 04/26/2016 0955   CO2 28 04/26/2016 0955   GLUCOSE 117 (H) 04/26/2016 0955   BUN 13 07/25/2016   CREATININE 1.0 07/25/2016   CREATININE 1.02 04/26/2016 0955   CALCIUM  9.5 04/26/2016 0955   PROT 6.3 04/26/2016 0955   ALBUMIN 4.2 04/26/2016 0955   AST 19 07/25/2016   ALT 16 07/25/2016   ALKPHOS 56 07/25/2016   BILITOT 0.5 04/26/2016 0955   GFRNONAA >60 02/11/2016 0646   GFRAA >60 02/11/2016 0646   Lab Results  Component Value Date   CHOL 137 11/23/2015   HDL 45.30 11/23/2015   LDLCALC 76 11/23/2015   LDLDIRECT 145.3 12/23/2011   TRIG 77.0 11/23/2015   CHOLHDL 3 11/23/2015   Lab Results  Component Value Date   HGBA1C 6.8 (H) 04/26/2016   Lab Results  Component Value Date   VITAMINB12 304 01/29/2016   Lab Results  Component Value Date   TSH 1.07 11/23/2015      ASSESSMENT AND PLAN 71 y.o. year old male  has a past medical history of Allergic rhinitis; Anemia; Asthma; Blood transfusion without reported diagnosis; Carpal tunnel syndrome; Cataract; Chronic rheumatic arthritis ([place]) (01/20/2014); Complete tear of rotator cuff (2016); Complication of anesthesia; Constipation; Diabetes mellitus; Elevated PSA; GERD (gastroesophageal reflux disease);  Headache(784.0); Hemorrhoids; Hernia; Hyperlipidemia; Nocturia; Numbness of fingers; Osteoarthritis; PONV (postoperative nausea and vomiting); and Skin cancer, basal cell. here with :  1. Chronic headache  The patient's headaches seems to be controlled with verapamil. We will slowly try to wean the patient off of Topamax. He will decrease to half a tablet in the morning and 1 tablet at noon for 1 week then decreased to half a tablet twice a day thereafter. If he is tolerating this well and his headaches do not return he will give [place] a call. We will continue to decrease Topamax until he is off the medication. He will follow-up in 4 months or sooner if needed.  I spent 15 minutes with the patient. 50% of this time was spent reviewing how to wean off Topamax.     Ward Givens, MSN, NP-C 08/13/2016, 2:31 PM [facility] [address] [place], [place] [address] [phone]

## 2016-08-13 NOTE — Patient Instructions (Signed)
Continue Verapamil  Decrease Topamax  Week 1: Topamax 1/2 tablet in the AM, 1 tablet in the evening Week 2: 1/2 tablet twice a day   If tolerating well and no headaches please call and we will further reduce Topamax  If your symptoms worsen or you develop new symptoms please let us know.

## 2016-08-13 NOTE — Progress Notes (Signed)
I have read the note, and I agree with the clinical assessment and plan.  Samina Weekes KEITH   

## 2016-08-14 ENCOUNTER — Other Ambulatory Visit: Payer: Self-pay | Admitting: Neurology

## 2016-08-14 ENCOUNTER — Encounter: Payer: Self-pay | Admitting: Internal Medicine

## 2016-08-14 NOTE — Telephone Encounter (Signed)
Called and spoke w/ patient. He stated he does need new rx called in. Only has enough for a week and a half.  He is concerned about cutting 100mg  tablet in half.   Placed pt on hold. Spoke with MM,NP. She will call in new rx for 50mg  tablet for pt to taper off medication.   Verified pharmacy: [id number] - [place], [place]: [phone]

## 2016-08-16 ENCOUNTER — Ambulatory Visit (HOSPITAL_BASED_OUTPATIENT_CLINIC_OR_DEPARTMENT_OTHER): Payer: BLUE CROSS/BLUE SHIELD

## 2016-08-16 ENCOUNTER — Encounter: Payer: Self-pay | Admitting: Hematology and Oncology

## 2016-08-16 ENCOUNTER — Ambulatory Visit (HOSPITAL_BASED_OUTPATIENT_CLINIC_OR_DEPARTMENT_OTHER): Payer: BLUE CROSS/BLUE SHIELD | Admitting: Hematology and Oncology

## 2016-08-16 VITALS — BP 122/63 | HR 79 | Temp 98.6°F | Resp 18 | Ht 63.5 in | Wt 123.8 lb

## 2016-08-16 DIAGNOSIS — Z803 Family history of malignant neoplasm of breast: Secondary | ICD-10-CM

## 2016-08-16 DIAGNOSIS — Z801 Family history of malignant neoplasm of trachea, bronchus and lung: Secondary | ICD-10-CM | POA: Diagnosis not present

## 2016-08-16 DIAGNOSIS — K635 Polyp of colon: Secondary | ICD-10-CM

## 2016-08-16 DIAGNOSIS — D649 Anemia, unspecified: Secondary | ICD-10-CM

## 2016-08-16 DIAGNOSIS — D500 Iron deficiency anemia secondary to blood loss (chronic): Secondary | ICD-10-CM

## 2016-08-16 DIAGNOSIS — Z720 Tobacco use: Secondary | ICD-10-CM

## 2016-08-16 DIAGNOSIS — E611 Iron deficiency: Secondary | ICD-10-CM

## 2016-08-16 DIAGNOSIS — M069 Rheumatoid arthritis, unspecified: Secondary | ICD-10-CM

## 2016-08-16 LAB — CBC & DIFF AND RETIC
BASO%: 0.6 % (ref 0.0–2.0)
Basophils Absolute: 0 10*3/uL (ref 0.0–0.1)
EOS%: 3.3 % (ref 0.0–7.0)
Eosinophils Absolute: 0.2 10*3/uL (ref 0.0–0.5)
HCT: 37.7 % — ABNORMAL LOW (ref 38.4–49.9)
HGB: 12.2 g/dL — ABNORMAL LOW (ref 13.0–17.1)
IMMATURE RETIC FRACT: 7.1 % (ref 3.00–10.60)
LYMPH#: 2.3 10*3/uL (ref 0.9–3.3)
LYMPH%: 33.5 % (ref 14.0–49.0)
MCH: 31 pg (ref 27.2–33.4)
MCHC: 32.4 g/dL (ref 32.0–36.0)
MCV: 95.7 fL (ref 79.3–98.0)
MONO#: 0.6 10*3/uL (ref 0.1–0.9)
MONO%: 9.3 % (ref 0.0–14.0)
NEUT%: 53.3 % (ref 39.0–75.0)
NEUTROS ABS: 3.7 10*3/uL (ref 1.5–6.5)
Platelets: 267 10*3/uL (ref 140–400)
RBC: 3.94 10*6/uL — AB (ref 4.20–5.82)
RDW: 15.9 % — AB (ref 11.0–14.6)
RETIC CT ABS: 53.19 10*3/uL (ref 34.80–93.90)
Retic %: 1.35 % (ref 0.80–1.80)
WBC: 6.9 10*3/uL (ref 4.0–10.3)

## 2016-08-16 LAB — COMPREHENSIVE METABOLIC PANEL
ALBUMIN: 4 g/dL (ref 3.5–5.0)
ALK PHOS: 57 U/L (ref 40–150)
ALT: 15 U/L (ref 0–55)
ANION GAP: 7 meq/L (ref 3–11)
AST: 15 U/L (ref 5–34)
BUN: 17.8 mg/dL (ref 7.0–26.0)
CO2: 27 meq/L (ref 22–29)
Calcium: 10 mg/dL (ref 8.4–10.4)
Chloride: 108 mEq/L (ref 98–109)
Creatinine: 0.9 mg/dL (ref 0.7–1.3)
EGFR: 86 mL/min/{1.73_m2} — AB (ref >90)
GLUCOSE: 72 mg/dL (ref 70–140)
POTASSIUM: 4.2 meq/L (ref 3.5–5.1)
SODIUM: 142 meq/L (ref 136–145)
Total Bilirubin: 0.42 mg/dL (ref 0.20–1.20)
Total Protein: 6.7 g/dL (ref 6.4–8.3)

## 2016-08-16 LAB — LACTATE DEHYDROGENASE: LDH: 177 U/L (ref 125–245)

## 2016-08-16 LAB — MORPHOLOGY: PLT EST: ADEQUATE

## 2016-08-16 NOTE — Patient Instructions (Signed)
Thank you for choosing [facility] to provide your oncology and hematology care.  To afford each patient quality time with our providers, please arrive 30 minutes before your scheduled appointment time.  If you arrive late for your appointment, you may be asked to reschedule.  We strive to give you quality time with our providers, and arriving late affects you and other patients whose appointments are after yours.   If you are a no show for multiple scheduled visits, you may be dismissed from the clinic at the providers discretion.    Again, thank you for choosing [facility], our hope is that these requests will decrease the amount of time that you wait before being seen by our physicians.  ______________________________________________________________________  Should you have questions after your visit to the [facility], please contact our office at [phone] between the hours of 8:30 and 4:30 p.m.    Voicemails left after 4:30p.m will not be returned until the following business day.    For prescription refill requests, please have your pharmacy contact us directly.  Please also try to allow 48 hours for prescription requests.    Please contact the scheduling department for questions regarding scheduling.  For scheduling of procedures such as PET scans, CT scans, MRI, Ultrasound, etc please contact central scheduling at [phone].    Resources For Cancer Patients and Caregivers:   Oncolink.org:  A wonderful resource for patients and healthcare providers for information regarding your disease, ways to tract your treatment, what to expect, etc.     American Cancer Society:  800-227-2345  Can help patients locate various types of support and financial assistance  Cancer Care: 1-800-813-HOPE (4673) Provides financial assistance, online support groups, medication/co-pay assistance.    [place] DSS:  336-641-3447 Where to apply for food  stamps, Medicaid, and utility assistance  Medicare Rights Center: 800-333-4114 Helps people with Medicare understand their rights and benefits, navigate the Medicare system, and secure the quality healthcare they deserve  [employer]: 336-333-6589  [employer]'s shared-ride transportation service for eligible riders who have a disability that prevents them from riding the fixed route bus.    For additional information on assistance programs please contact our social worker:   Grier Hock/Abigail Elmore:  [phone]            

## 2016-08-17 LAB — HAPTOGLOBIN: Haptoglobin: 145 mg/dL (ref 34–200)

## 2016-08-17 LAB — C-REACTIVE PROTEIN: CRP: 0.3 mg/L (ref 0.0–4.9)

## 2016-08-17 LAB — SEDIMENTATION RATE: Sedimentation Rate-Westergren: 5 mm/hr (ref 0–30)

## 2016-08-19 LAB — HELICOBACTER PYLORI ABS-IGG+IGA, BLD
H. pylori, IgA Abs: <9 units (ref 0.0–8.9)
H. pylori, IgG AbS: <0.8 Index Value (ref 0.00–0.79)

## 2016-08-19 LAB — DIRECT ANTIGLOBULIN TEST (NOT AT ARMC): COOMBS', DIRECT: NEGATIVE

## 2016-08-20 ENCOUNTER — Other Ambulatory Visit: Payer: Self-pay | Admitting: Internal Medicine

## 2016-08-20 DIAGNOSIS — D649 Anemia, unspecified: Secondary | ICD-10-CM | POA: Insufficient documentation

## 2016-08-20 NOTE — Progress Notes (Signed)
[facility] New Visit:  Assessment: Anemia 71 year old male with normocytic normochromic anemia with history of gastrointestinal bleeding, likely from the lower segments of the digestive tract based on red blood per rectum. That time, iron deficiency was discovered, patient was started on replacement therapy. Since initiation of oral iron, hemoglobin has improved significantly and is currently at the baseline that has been normal for the patient over the past 4 years.  His additional history that likely is contributing significantly includes rheumatoid arthritis currently treated with Enbrel and methotrexate. Both rheumatological condition and he is therapeutic agents are known to contribute to bone marrow suppression diminishing bone marrow's capacity to produce red blood cells. Patient is symptomatic with fatigue and somnolence and the question is whether he is anemia is contributing to that. Based on the degree of anemia, I would be hard pressed to imagine that there is a significant contributor considering his hemoglobin is over 12.0. More likely, that he has persistent inflammatory state in the body that is causing the weight loss and fatigue. Nevertheless, autoimmune anemia cannot be fully excluded this point in time. Also development of a neoplastic process in the context of rheumatoid arthritis cannot be fully ruled out based on the available information.  Plan: --Labs today as outlined below --RTC 1 wk to review  Voice recognition software was used and creation of this note. Despite my best effort at editing the text, some misspelling/errors may have occurred.   Orders Placed This Encounter  Procedures  . CBC & Diff and Retic    Standing Status:   Future    Number of Occurrences:   1    Standing Expiration Date:   08/16/2017  . Morphology    Standing Status:   Future    Number of Occurrences:   1    Standing Expiration Date:   08/16/2017  . Lactate dehydrogenase  (LDH)    Standing Status:   Future    Number of Occurrences:   1    Standing Expiration Date:   08/16/2017  . Comprehensive metabolic panel    Standing Status:   Future    Number of Occurrences:   1    Standing Expiration Date:   08/16/2017  . Haptoglobin    Standing Status:   Future    Number of Occurrences:   1    Standing Expiration Date:   08/16/2017  . Sedimentation rate    Standing Status:   Future    Number of Occurrences:   1    Standing Expiration Date:   08/16/2017  . C-reactive protein    Standing Status:   Future    Number of Occurrences:   1    Standing Expiration Date:   08/16/2017  . Helicobacter pylori antibodies IgG, IgA, IgM    Standing Status:   Future    Number of Occurrences:   1    Standing Expiration Date:   08/16/2017  . Direct antiglobulin test ([place])    Standing Status:   Future    Number of Occurrences:   1    Standing Expiration Date:   08/16/2017    All questions were answered.  . The patient knows to call the clinic with any problems, questions or concerns.  This note was electronically signed.    History of Presenting Illness LORAIN KEAST a  70 y.o. male presenting to the [facility] for Additional input regarding his anemia. Patient reports anemia of at least 4 years  duration, supported by his available lab work. Patient did have significant episode of hematochezia in January or February 2018 and underwent endoscopic evaluation revealing multiple polyps in his colon and no significant abnormalities in the upper GI tract. Over the past year, patient lost approximately 13 pounds which is about 10% of his weight. This is accompanied by fatigue, somnolence, diarrhea bouts since February 2018. Patient denies any fevers, chills, night sweats. No significant changes in his appetite. He denies dysphasia, nausea, early satiety, constipation. No respiratory, genitourinary, cutaneous, or neurological symptoms. 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 Patient is currently receiving Enbrel and methotrexate for his underlying autoimmune condition.  Oncological/hematological History: --Labs, 03/20/11: Hgb 13.1, MCV 86.4, MCH 29.1, RDW 13.9, Plt 300; --Labs, 11/27/12: Hgb 12.9, MCV 88.4, MCH 28.8, RDW 14.8, Plt 344; --Labs, 08/01/14: Hgb 11.8, MCV 90.3, MCH 29.4, RDW 15.8, Plt 333; --Labs, 01/26/15: Hgb 12.4, MCV 88.3, MCH ..., RDW 15.2, Plt 347; --Labs, 01/29/16: Hgb 11.5, MCV 90.3, MCH ..., RDW 16.0, Plt 386; Fe 65, FeSat ..., TIBC ..., Ferritin 21.4, Folate >23.4, Vit B12 304;  --EGD/Colonoscopy, 02/08/16: Patient initially presented with hematochezia and underwent evaluation with endoscopy. EGD -- Normal appearance of esophagus. Diffuse mildly nodular mucosa was found in the gastric body. The exam of the stomach was otherwise normal. The duodenal bulb and second portion of the duodenum were normal. Colonoscopy -- 2.0-2.5cm  polyp ascending colon polyp. The polyp was sessile. The polyp was removed with a hot snare in 2 passes. Resection and retrieval were complete. Area was tattooed with an injection of Spot (carbon black). To prevent bleeding after the polypectomy, two hemostatic clips were successfully placed. Numerous other polyps discovered throughout the colon, total # 15. All removed with cold snare technique. Multiple medium-mouthed diverticula were found in the left colon. Internal hemorrhoids were found during retroflexion. Pathology -- Benign gastric mucosa with minimal chronic inflammation and no metaplasia. All polyps are present in the adenoma without high-grade dysplasia or malignancy. The suture was complicated by recurrent bleeding from the site of removal of the largest polyp which required repeat procedure for hemostasis.  --Labs, 02/10/16: Hgb 8.5, MCV 91.9, MCH 29.9, RDW 15.6, Plt 255;  --Labs, 04/26/16: Hgb  11.8, MCV 92.2, MCH ..., RDW 15.1, Plt 327; Fe 26, FeSat ..., TIBC ..., Ferritin 43.8; --Labs, 06/20/16: Hgb 12.0, MCV 91.6, MCH ..., RDW 15.4, Plt 307; TTG Ab 2 (neg); Fe 77, FeSat 25%, TIBC 304, Ferritin 54.8; Testosterone 302; --Colonoscopy, 08/07/16: Non-thrombosed external hemorrhoids. Multiple medium-mouthed diverticula were found in the sigmoid colon. 0.3cm polyp was found in the hepatic flexure. The polyp was sessile. 0.5cm polyp was found in the sigmoid colon. The polyp was sessile. Internal hemorrhoids. Pathology -- both polyps are tubular adenoma, flexure polyp also has hyperplastic features without high-grade dysplasia or malignancy.   Treatment Hx: --Transfusions:  --Feb 2018: received x3U pRBC   Medical History: Past Medical History:  Diagnosis Date  . Allergic rhinitis   . Anemia   . Asthma    "I outgrew it"  . Blood transfusion without reported diagnosis   . Carpal tunnel syndrome   . Cataract   . Chronic rheumatic arthritis ([place]) 01/20/2014   Per Dr. Amil Amen   . Complete tear of rotator cuff 2016   Right, MRI in 2016, complete rupture of long head of the biceps as well as a tear of the subscapularis and supraspinatus  tendons, Dr. Mardelle Matte  . Complication of anesthesia   . Constipation   . Diabetes mellitus    2012  . Elevated PSA   . GERD (gastroesophageal reflux disease)   . Headache(784.0)    headaches-related to sinuses & uses Tylenol   . Hemorrhoids   . Hernia   . Hyperlipidemia   . Nocturia   . Numbness of fingers   . Osteoarthritis    ankle- left, hands & neck & hip  . PONV (postoperative nausea and vomiting)    WITH ETHER  . Skin cancer, basal cell    sees derm    Surgical History: Past Surgical History:  Procedure Laterality Date  . ANKLE FUSION Left 03-19-13   Dr Mardelle Matte  . ANKLE SURGERY Left   . APPENDECTOMY     1964  . BACK SURGERY     cerv. fusion C1- T1, 2 separate surgeries , T1-T3 2014  . CARPAL TUNNEL RELEASE Right   . CARPAL TUNNEL  RELEASE Left 07-14-2014   [facility]  . COLONOSCOPY N/A 02/11/2016   Procedure: COLONOSCOPY;  Surgeon: Ladene Artist, MD;  Location: [facility];  Service: Endoscopy;  Laterality: N/A;  . CYSTOSCOPY     multiple times for infections  . INGUINAL HERNIA REPAIR  03/27/2011   Procedure: HERNIA REPAIR INGUINAL ADULT;  Surgeon: Harl Bowie, MD;  Location: [facility];  Service: General;  Laterality: Left;  . INGUINAL HERNIA REPAIR Bilateral 12/09/2012   Procedure: LAPAROSCOPIC BILATERAL INGUINAL HERNIA REPAIR WITH MESH;  Surgeon: Harl Bowie, MD;  Location: [facility];  Service: General;  Laterality: Bilateral;  . INSERTION OF MESH Bilateral 12/09/2012   Procedure: INSERTION OF MESH;  Surgeon: Harl Bowie, MD;  Location: [facility];  Service: General;  Laterality: Bilateral;  . left ankle mass removed  1972   3 surgeries on L ankle- 73, 2008, 2009  . NECK SURGERY  03/11,    C3-C7  . RECTAL SURGERY  at birth   "[place]" SURGICAL CORRECTION  . ROTATOR CUFF REPAIR W/ DISTAL CLAVICLE EXCISION Right 08/25/2014   And acromioplasty, Dr. Mardelle Matte  . SHOULDER SURGERY  09/17/12  . TONSILLECTOMY    . VARICOCELECTOMY      Family History: Family History  Problem Relation Age of Onset  . Stroke Mother   . Anesthesia problems Mother   . Cerebral aneurysm Father   . Skin cancer Father        melanoma  . Stroke Father   . Diabetes Sister        hypoglycemia  . Breast cancer Sister        x 2  . Lung cancer Sister   . Atrial fibrillation Sister   . Lung cancer Sister   . Emphysema Sister   . Atrial fibrillation Sister   . Colon cancer Neg Hx   . Prostate cancer Neg Hx   . Colon polyps Neg Hx   . Esophageal cancer Neg Hx   . Rectal cancer Neg Hx   . Stomach cancer Neg Hx     Social History: Social History   Social History  . Marital status: Married    Spouse name: Arbie Cookey  . Number of children: 3  . Years of education: BA+   Occupational History  . 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 Smoking status: Current Every Day Smoker    Packs/day: 0.25    Types: Cigarettes    Last attempt to  quit: 04/10/2012  . Smokeless tobacco: Never Used     Comment: no since 02-2016  . Alcohol use No  . Drug use: No  . Sexual activity: Not Currently   Other Topics Concern  . Not on file   Social History Narrative   Married , lives w/ wife   Right-handed   Rare caffeine intake       Allergies: Allergies  Allergen Reactions  . Aspirin Anaphylaxis  . Codeine Phosphate Anaphylaxis  . Ibuprofen Anaphylaxis  . Levofloxacin Itching and Anaphylaxis  . Menthol Swelling  . Monosodium Glutamate Diarrhea and Nausea And Vomiting    Other reaction(s): Cramps (ALLERGY/intolerance)  . Morphine Sulfate Anaphylaxis  . Naproxen Sodium Anaphylaxis  . Nsaids Anaphylaxis  . Other Itching    **BAND-AID**  . Sulfamethoxazole Anaphylaxis  . Cymbalta [Duloxetine Hcl] Other (See Comments)    Dizziness, syncope, and sleepiness     Medications:  Current Outpatient Prescriptions  Medication Sig Dispense Refill  . acetaminophen (TYLENOL) 500 MG tablet Take 500-1,000 mg by mouth every 4 (four) hours as needed. For pain    . albuterol (VENTOLIN HFA) 108 (90 BASE) MCG/ACT inhaler Inhale 2 puffs into the lungs every 6 (six) hours as needed for wheezing or shortness of breath. 1 Inhaler 1  . Blood Glucose Monitoring Suppl (ONE TOUCH ULTRA SYSTEM KIT) W/DEVICE KIT Use device to check blood sugar. 1 each 0  . cycloSPORINE (RESTASIS) 0.05 % ophthalmic emulsion 1 drop 2 (two) times daily.    . diphenhydrAMINE (BENADRYL) 25 MG tablet Take 25 mg by mouth every 6 (six) hours as needed for allergies.     . DOCUSATE SODIUM PO Take 100 mg by mouth daily as needed (constipation). Reported on 01/26/2015    . esomeprazole (NEXIUM) 10 MG packet Take 10 mg by mouth daily before breakfast.    . etanercept (ENBREL) 50 MG/ML injection Inject 50 mg into the skin once a week.    . ferrous  sulfate 325 (65 FE) MG tablet Take 1 tablet (325 mg total) by mouth 2 (two) times daily with a meal. 180 tablet 1  . fish oil-omega-3 fatty acids 1000 MG capsule Take 1 g by mouth 2 (two) times daily.     . fluticasone (FLONASE) 50 MCG/ACT nasal spray Place 1 spray into both nostrils daily.    . folic acid (FOLVITE) 1 MG tablet Take 1 mg by mouth daily.    Marland Kitchen gabapentin (NEURONTIN) 300 MG capsule Take 1 capsule (300 mg total) by mouth 4 (four) times daily. 360 capsule 1  . glucose blood (FREESTYLE LITE) test strip Check blood sugar no more than twice daily. 100 each 12  . hydrocortisone 2.5 % cream Apply 1 application topically daily as needed (itching). Reported on 01/26/2015    . ketoconazole (NIZORAL) 2 % cream Apply 1 application topically daily. 30 g 1  . lidocaine (LIDODERM) 5 % Place 2 patches onto the skin daily. (Patient taking differently: Place 2 patches onto the skin daily as needed (pain). ) 90 patch 3  . Menthol, Topical Analgesic, (BIOFREEZE ROLL-ON EX) Apply 1 application topically daily as needed (pain).     . metFORMIN (GLUCOPHAGE) 1000 MG tablet Take 1 tablet (1,000 mg total) by mouth 2 (two) times daily with a meal. 180 tablet 1  . Methotrexate, PF, 20 MG/0.4ML SOAJ Inject into the skin. Every Thursday    . MYRBETRIQ 50 MG TB24 tablet Take 50 mg by mouth daily.     . pravastatin (  PRAVACHOL) 40 MG tablet Take 1 tablet (40 mg total) by mouth at bedtime. 30 tablet 3  . PROCTOSOL HC 2.5 % rectal cream Place rectally 2 (two) times daily. Reported on 01/26/2015 30 g 2  . Psyllium (METAMUCIL PO) Take 1 tablet by mouth daily.     . Simethicone (GAS-X PO) Take 1 tablet by mouth 3 (three) times daily as needed (gas). Reported on 01/26/2015    . Tamsulosin HCl (FLOMAX) 0.4 MG CAPS Take 0.4 mg by mouth daily after supper.     . topiramate (TOPAMAX) 50 MG tablet Take 1 tab PO in the AM and 2 tabs at bedtime for 1 week then decrease to 1 tablet PO BID 90 tablet 5  . verapamil (CALAN-SR) 120 MG  CR tablet Take 1 tablet (120 mg total) by mouth at bedtime. 90 tablet 1   Current Facility-Administered Medications  Medication Dose Route Frequency Provider Last Rate Last Dose  . 0.9 %  sodium chloride infusion  500 mL Intravenous Continuous Armbruster, Renelda Loma, MD        Review of Systems: Review of Systems  Constitutional: Positive for fatigue and unexpected weight change. Negative for appetite change, chills, diaphoresis and fever.  HENT:  Negative.   Eyes: Negative.   Respiratory: Negative.   Cardiovascular: Negative.   Gastrointestinal: Positive for blood in stool and diarrhea. Negative for abdominal distention, abdominal pain, constipation, nausea, rectal pain and vomiting.  Endocrine: Negative.   Genitourinary: Negative.    Musculoskeletal: Negative.   Skin: Negative.   Neurological: Negative.   Hematological: Negative.   Psychiatric/Behavioral: Negative.   All other systems reviewed and are negative.    PHYSICAL EXAMINATION Blood pressure 122/63, pulse 79, temperature 98.6 F (37 C), temperature source Oral, resp. rate 18, height 5' 3.5" (1.613 m), weight 123 lb 12.8 oz (56.2 kg), SpO2 100 %.  ECOG PERFORMANCE STATUS: 1 - Symptomatic but completely ambulatory  Physical Exam  Constitutional: He is oriented to person, place, and time and well-developed, well-nourished, and in no distress. No distress.  HENT:  Head: Normocephalic and atraumatic.  Mouth/Throat: Oropharynx is clear and moist. No oropharyngeal exudate.  Eyes: Pupils are equal, round, and reactive to light. EOM are normal. No scleral icterus.  Neck: No thyromegaly present.  Cardiovascular: Normal rate, regular rhythm and normal heart sounds.   No murmur heard. Pulmonary/Chest: Effort normal and breath sounds normal. No respiratory distress. He has no wheezes. He has no rales.  Abdominal: Soft. He exhibits no mass. There is no tenderness.  Musculoskeletal: Normal range of motion. He exhibits no edema.   Lymphadenopathy:    He has no cervical adenopathy.  Neurological: He is alert and oriented to person, place, and time. He has normal reflexes.  Skin: Skin is warm. No rash noted. He is not diaphoretic. No erythema. No pallor.     LABORATORY DATA: I have personally reviewed the data as listed: Appointment on 08/16/2016  Component Date Value Ref Range Status  . WBC 08/16/2016 6.9  4.0 - 10.3 10e3/uL Final  . NEUT# 08/16/2016 3.7  1.5 - 6.5 10e3/uL Final  . HGB 08/16/2016 12.2* 13.0 - 17.1 g/dL Final  . HCT 08/16/2016 37.7* 38.4 - 49.9 % Final  . Platelets 08/16/2016 267  140 - 400 10e3/uL Final  . MCV 08/16/2016 95.7  79.3 - 98.0 fL Final  . MCH 08/16/2016 31.0  27.2 - 33.4 pg Final  . MCHC 08/16/2016 32.4  32.0 - 36.0 g/dL Final  . RBC 08/16/2016  3.94* 4.20 - 5.82 10e6/uL Final  . RDW 08/16/2016 15.9* 11.0 - 14.6 % Final  . lymph# 08/16/2016 2.3  0.9 - 3.3 10e3/uL Final  . MONO# 08/16/2016 0.6  0.1 - 0.9 10e3/uL Final  . Eosinophils Absolute 08/16/2016 0.2  0.0 - 0.5 10e3/uL Final  . Basophils Absolute 08/16/2016 0.0  0.0 - 0.1 10e3/uL Final  . NEUT% 08/16/2016 53.3  39.0 - 75.0 % Final  . LYMPH% 08/16/2016 33.5  14.0 - 49.0 % Final  . MONO% 08/16/2016 9.3  0.0 - 14.0 % Final  . EOS% 08/16/2016 3.3  0.0 - 7.0 % Final  . BASO% 08/16/2016 0.6  0.0 - 2.0 % Final  . Retic % 08/16/2016 1.35  0.80 - 1.80 % Final  . Retic Ct Abs 08/16/2016 53.19  34.80 - 93.90 10e3/uL Final  . Immature Retic Fract 08/16/2016 7.10  3.00 - 10.60 % Final  . Polychromasia 08/16/2016 Slight  Slight Final  . Ovalocytes 08/16/2016 Few  Negative Final  . Acanthocytes 08/16/2016 Occ  Negative Final  . Helmet Cell 08/16/2016 Occ   Negative Final  . PLT EST 08/16/2016 Adequate  Adequate Final  . LDH 08/16/2016 177  125 - 245 U/L Final  . Sodium 08/16/2016 142  136 - 145 mEq/L Final  . Potassium 08/16/2016 4.2  3.5 - 5.1 mEq/L Final  . Chloride 08/16/2016 108  98 - 109 mEq/L Final  . CO2 08/16/2016 27  22 - 29  mEq/L Final  . Glucose 08/16/2016 72  70 - 140 mg/dl Final   Glucose reference range is for nonfasting patients. Fasting glucose reference range is 70- 100.  Marland Kitchen BUN 08/16/2016 17.8  7.0 - 26.0 mg/dL Final  . Creatinine 08/16/2016 0.9  0.7 - 1.3 mg/dL Final  . Total Bilirubin 08/16/2016 0.42  0.20 - 1.20 mg/dL Final  . Alkaline Phosphatase 08/16/2016 57  40 - 150 U/L Final  . AST 08/16/2016 15  5 - 34 U/L Final  . ALT 08/16/2016 15  0 - 55 U/L Final  . Total Protein 08/16/2016 6.7  6.4 - 8.3 g/dL Final  . Albumin 08/16/2016 4.0  3.5 - 5.0 g/dL Final  . Calcium 08/16/2016 10.0  8.4 - 10.4 mg/dL Final  . Anion Gap 08/16/2016 7  3 - 11 mEq/L Final  . EGFR 08/16/2016 86* >90 ml/min/1.73 m2 Final   eGFR is calculated using the CKD-EPI Creatinine Equation (2009)  . Haptoglobin 08/16/2016 145  34 - 200 mg/dL Final  . Coombs', Direct 08/16/2016 Negative  Negative Final  . Sedimentation Rate-Westergren 08/16/2016 5  0 - 30 mm/hr Final  . CRP 08/16/2016 0.3  0.0 - 4.9 mg/L Final  . H. pylori, IgA Abs 08/16/2016 <9.0  0.0 - 8.9 units Final   Comment:                                                Negative          <9.0                                                Equivocal   9.0 - 11.0  Positive         >11.0   . H. pylori, IgG AbS 08/16/2016 <0.80  0.00 - 0.79 Index Value Final   Comment:                                             Negative           <0.80                                             Equivocal    0.80 - 0.89                                             Positive           >0.89   Abstract on 08/14/2016  Component Date Value Ref Range Status  . PSA 07/11/2016 7.65   Final         Ardath Sax, MD

## 2016-08-20 NOTE — Assessment & Plan Note (Signed)
71 year old male with normocytic normochromic anemia with history of gastrointestinal bleeding, likely from the lower segments of the digestive tract based on red blood per rectum. That time, iron deficiency was discovered, patient was started on replacement therapy. Since initiation of oral iron, hemoglobin has improved significantly and is currently at the baseline that has been normal for the patient over the past 4 years.  His additional history that likely is contributing significantly includes rheumatoid arthritis currently treated with Enbrel and methotrexate. Both rheumatological condition and he is therapeutic agents are known to contribute to bone marrow suppression diminishing bone marrow's capacity to produce red blood cells. Patient is symptomatic with fatigue and somnolence and the question is whether he is anemia is contributing to that. Based on the degree of anemia, I would be hard pressed to imagine that there is a significant contributor considering his hemoglobin is over 12.0. More likely, that he has persistent inflammatory state in the body that is causing the weight loss and fatigue. Nevertheless, autoimmune anemia cannot be fully excluded this point in time. Also development of a neoplastic process in the context of rheumatoid arthritis cannot be fully ruled out based on the available information.  Plan: --Labs today as outlined below --RTC 1 wk to review  Voice recognition software was used and creation of this note. Despite my best effort at editing the text, some misspelling/errors may have occurred.

## 2016-08-21 ENCOUNTER — Ambulatory Visit: Payer: BLUE CROSS/BLUE SHIELD | Admitting: Internal Medicine

## 2016-08-21 ENCOUNTER — Telehealth: Payer: Self-pay | Admitting: Adult Health

## 2016-08-21 NOTE — Telephone Encounter (Signed)
I called pt and he was confused at the new prescription directions.  It looked like he was to continue taking 50mg  po bid longterm.    I relayed to him that per MM/NP note, if no headache will continue the taper of the topamax.  He started the 50mg  po bid yesterday.  I told him that if he continues without headaches then to decrease to 50mg  po daily for a week then stop.  He verbalized understanding and knows to call if questions, or headaches come back.

## 2016-08-21 NOTE — Telephone Encounter (Signed)
noted 

## 2016-08-21 NOTE — Telephone Encounter (Signed)
Pt called the clinic needs clarification on topiramate (TOPAMAX) 50 MG tablet . He said directions on the bottle are different than the OV summary. Please call to discuss

## 2016-08-23 ENCOUNTER — Ambulatory Visit (HOSPITAL_BASED_OUTPATIENT_CLINIC_OR_DEPARTMENT_OTHER): Payer: BLUE CROSS/BLUE SHIELD | Admitting: Hematology and Oncology

## 2016-08-23 ENCOUNTER — Telehealth: Payer: Self-pay

## 2016-08-23 ENCOUNTER — Encounter: Payer: Self-pay | Admitting: Hematology and Oncology

## 2016-08-23 VITALS — BP 115/63 | HR 89 | Temp 98.0°F | Resp 17 | Ht 63.5 in | Wt 123.1 lb

## 2016-08-23 DIAGNOSIS — K922 Gastrointestinal hemorrhage, unspecified: Secondary | ICD-10-CM

## 2016-08-23 DIAGNOSIS — K649 Unspecified hemorrhoids: Secondary | ICD-10-CM | POA: Diagnosis not present

## 2016-08-23 DIAGNOSIS — D649 Anemia, unspecified: Secondary | ICD-10-CM | POA: Diagnosis not present

## 2016-08-23 DIAGNOSIS — D500 Iron deficiency anemia secondary to blood loss (chronic): Secondary | ICD-10-CM

## 2016-08-23 NOTE — Telephone Encounter (Signed)
Pt on the way to appt. Was told that it was at 1000. Appt schedule says 0800. Dr. Lebron Conners will still see pt.

## 2016-08-23 NOTE — Assessment & Plan Note (Signed)
72 year old male with mild improving normocytic normochromic anemia with history of gastrointestinal bleeding, likely from the lower segments of the digestive tract based on red blood per rectum. That time, iron deficiency was discovered, patient was started on replacement therapy. Since initiation of oral iron, hemoglobin has improved significantly and is currently at the baseline that has been normal for the patient over the past 4 years. No significant elevation of inflammatory markers identified, no evidence of hemolysis. Due to normal platelets and reasonably normal hemoglobin, I do not suspect development of LGL at this time.  In conclusion, were likely dealing with a combination of bone marrow suppression due to use of methotrexate and Enbrel coupled with iron deficiency anemia due to recurrent gastrointestinal bleeding from hemorrhoids.  Recommendations: --Continue his current medications including iron replacement therapy --Check space with differential at least by primary care provider --RTC with hematology on as needed basis if anemia worsens again   Voice recognition software was used and creation of this note. Despite my best effort at editing the text, some misspelling/errors may have occurred.

## 2016-08-23 NOTE — Progress Notes (Signed)
[facility] Follow-up Visit:  Assessment: Iron deficiency anemia due to chronic blood loss 71 year old male with mild improving normocytic normochromic anemia with history of gastrointestinal bleeding, likely from the lower segments of the digestive tract based on red blood per rectum. That time, iron deficiency was discovered, patient was started on replacement therapy. Since initiation of oral iron, hemoglobin has improved significantly and is currently at the baseline that has been normal for the patient over the past 4 years. No significant elevation of inflammatory markers identified, no evidence of hemolysis. Due to normal platelets and reasonably normal hemoglobin, I do not suspect development of LGL at this time.  In conclusion, were likely dealing with a combination of bone marrow suppression due to use of methotrexate and Enbrel coupled with iron deficiency anemia due to recurrent gastrointestinal bleeding from hemorrhoids.  Recommendations: --Continue his current medications including iron replacement therapy --Check space with differential at least by primary care provider --RTC with hematology on as needed basis if anemia worsens again   Voice recognition software was used and creation of this note. Despite my best effort at editing the text, some misspelling/errors may have occurred.   No orders of the defined types were placed in this encounter.   All questions were answered.  . The patient knows to call the clinic with any problems, questions or concerns.  This note was electronically signed.    History of Presenting Illness Christian Roberts is a 71 y.o.  male followed in the [place] for additional input regarding his anemia. Patient reports anemia of at least 4 years duration, supported by his available lab work. Patient did have significant episode of hematochezia in January or February 2018 and underwent endoscopic evaluation revealing multiple  polyps in his colon and no significant abnormalities in the upper GI tract. Over the past year, patient lost approximately 13 pounds which is about 10% of his weight. This is accompanied by fatigue, somnolence, diarrhea bouts since February 2018. Patient denies any fevers, chills, night sweats. No significant changes in his appetite. He denies dysphasia, nausea, early satiety, constipation. No respiratory, genitourinary, cutaneous, or neurological symptoms. 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 Patient is currently receiving Enbrel and methotrexate for his underlying autoimmune condition.  Patient returns to the clinic to review results of the evaluation obtained previously. He denies any new complaints.  Oncological/hematological History: --Labs, 03/20/11: Hgb 13.1, MCV 86.4, MCH 29.1, RDW 13.9, Plt 300; --Labs, 11/27/12: Hgb 12.9, MCV 88.4, MCH 28.8, RDW 14.8, Plt 344; --Labs, 08/01/14: Hgb 11.8, MCV 90.3, MCH 29.4, RDW 15.8, Plt 333; --Labs, 01/26/15: Hgb 12.4, MCV 88.3, MCH ..., RDW 15.2, Plt 347; --Labs, 01/29/16: Hgb 11.5, MCV 90.3, MCH ..., RDW 16.0, Plt 386; Fe 65, FeSat ..., TIBC ..., Ferritin 21.4, Folate >23.4, Vit B12 304;  --EGD/Colonoscopy, 02/08/16: Patient initially presented with hematochezia and underwent evaluation with endoscopy. EGD -- Normal appearance of esophagus. Diffuse mildly nodular mucosa was found in the gastric body. The exam of the stomach was otherwise normal. The duodenal bulb and second portion of the duodenum were normal. Colonoscopy -- 2.0-2.5cm  polyp ascending colon polyp. The polyp was sessile. The polyp was removed with a hot snare in 2 passes. Resection and retrieval were complete. Area was tattooed with an injection of Spot (carbon black). To prevent bleeding after the polypectomy, two hemostatic clips were successfully placed. Numerous  other  polyps discovered throughout the colon, total # 15. All removed with cold snare technique. Multiple medium-mouthed diverticula were found in the left colon. Internal hemorrhoids were found during retroflexion. Pathology -- Benign gastric mucosa with minimal chronic inflammation and no metaplasia. All polyps are present in the adenoma without high-grade dysplasia or malignancy. The suture was complicated by recurrent bleeding from the site of removal of the largest polyp which required repeat procedure for hemostasis.  --Labs, 02/10/16: Hgb 8.5, MCV 91.9, MCH 29.9, RDW 15.6, Plt 255;  --Labs, 04/26/16: Hgb 11.8, MCV 92.2, MCH ..., RDW 15.1, Plt 327; Fe 26, FeSat ..., TIBC ..., Ferritin 43.8; --Labs, 06/20/16: Hgb 12.0, MCV 91.6, MCH ..., RDW 15.4, Plt 307; TTG Ab 2 (neg); Fe 77, FeSat 25%, TIBC 304, Ferritin 54.8; Testosterone 302; --Colonoscopy, 08/07/16: Non-thrombosed external hemorrhoids. Multiple medium-mouthed diverticula were found in the sigmoid colon. 0.3cm polyp was found in the hepatic flexure. The polyp was sessile. 0.5cm polyp was found in the sigmoid colon. The polyp was sessile. Internal hemorrhoids. Pathology -- both polyps are tubular adenoma, flexure polyp also has hyperplastic features without high-grade dysplasia or malignancy.  --Labs, 08/16/16: Hgb 12.2, MCV 95.7, MCH 31.0, RDW 15.9, Plt 267; ESR 5, CRP 0.3, LDH 177, haptoglobin 145, DAT -- negative; H pylori -- negative  Treatment Hx: --Transfusions:             --Feb 2018: received x3U pRBC  Medical History: Past Medical History:  Diagnosis Date  . Allergic rhinitis   . Anemia   . Asthma    "I outgrew it"  . Blood transfusion without reported diagnosis   . Carpal tunnel syndrome   . Cataract   . Chronic rheumatic arthritis ([place]) 01/20/2014   Per Dr. Amil Amen   . Complete tear of rotator cuff 2016   Right, MRI in 2016, complete rupture of long head of the biceps as well as a tear of the subscapularis and  supraspinatus tendons, Dr. Mardelle Matte  . Complication of anesthesia   . Constipation   . Diabetes mellitus    2012  . Elevated PSA   . GERD (gastroesophageal reflux disease)   . Headache(784.0)    headaches-related to sinuses & uses Tylenol   . Hemorrhoids   . Hernia   . Hyperlipidemia   . Nocturia   . Numbness of fingers   . Osteoarthritis    ankle- left, hands & neck & hip  . PONV (postoperative nausea and vomiting)    WITH ETHER  . Skin cancer, basal cell    sees derm    Surgical History: Past Surgical History:  Procedure Laterality Date  . ANKLE FUSION Left 03-19-13   Dr Mardelle Matte  . ANKLE SURGERY Left   . APPENDECTOMY     1964  . BACK SURGERY     cerv. fusion C1- T1, 2 separate surgeries , T1-T3 2014  . CARPAL TUNNEL RELEASE Right   . CARPAL TUNNEL RELEASE Left 07-14-2014   [facility]  . COLONOSCOPY N/A 02/11/2016   Procedure: COLONOSCOPY;  Surgeon: Ladene Artist, MD;  Location: [facility];  Service: Endoscopy;  Laterality: N/A;  . CYSTOSCOPY     multiple times for infections  . INGUINAL HERNIA REPAIR  03/27/2011   Procedure: HERNIA REPAIR INGUINAL ADULT;  Surgeon: Harl Bowie, MD;  Location: [place] [facility];  Service: General;  Laterality: Left;  . INGUINAL HERNIA REPAIR Bilateral 12/09/2012   Procedure: LAPAROSCOPIC BILATERAL INGUINAL HERNIA REPAIR WITH MESH;  Surgeon: Harl Bowie, MD;  Location: [facility];  Service: General;  Laterality: Bilateral;  . INSERTION OF MESH Bilateral 12/09/2012   Procedure: INSERTION OF MESH;  Surgeon: Harl Bowie, MD;  Location: [facility];  Service: General;  Laterality: Bilateral;  . left ankle mass removed  1972   3 surgeries on L ankle- 73, 2008, 2009  . NECK SURGERY  03/11,    C3-C7  . RECTAL SURGERY  at birth   "[place]" SURGICAL CORRECTION  . ROTATOR CUFF REPAIR W/ DISTAL CLAVICLE EXCISION Right 08/25/2014   And acromioplasty, Dr. Mardelle Matte  . SHOULDER SURGERY  09/17/12  . TONSILLECTOMY    . VARICOCELECTOMY       Family History: Family History  Problem Relation Age of Onset  . Stroke Mother   . Anesthesia problems Mother   . Cerebral aneurysm Father   . Skin cancer Father        melanoma  . Stroke Father   . Diabetes Sister        hypoglycemia  . Breast cancer Sister        x 2  . Lung cancer Sister   . Atrial fibrillation Sister   . Lung cancer Sister   . Emphysema Sister   . Atrial fibrillation Sister   . Colon cancer Neg Hx   . Prostate cancer Neg Hx   . Colon polyps Neg Hx   . Esophageal cancer Neg Hx   . Rectal cancer Neg Hx   . Stomach cancer Neg Hx     Social History: Social History   Social History  . Marital status: Married    Spouse name: Arbie Cookey  . Number of children: 3  . Years of education: BA+   Occupational History  . 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 Smoking status: Current Every Day Smoker    Packs/day: 0.25    Types: Cigarettes    Last attempt to quit: 04/10/2012  . Smokeless tobacco: Never Used     Comment: no since 02-2016  . Alcohol use No  . Drug use: No  . Sexual activity: Not Currently   Other Topics Concern  . Not on file   Social History Narrative   Married , lives w/ wife   Right-handed   Rare caffeine intake       Allergies: Allergies  Allergen Reactions  . Aspirin Anaphylaxis  . Codeine Phosphate Anaphylaxis  . Ibuprofen Anaphylaxis  . Levofloxacin Itching and Anaphylaxis  . Menthol Swelling  . Monosodium Glutamate Diarrhea and Nausea And Vomiting    Other reaction(s): Cramps (ALLERGY/intolerance)  . Morphine Sulfate Anaphylaxis  . Naproxen Sodium Anaphylaxis  . Nsaids Anaphylaxis  . Other Itching    **BAND-AID**  . Sulfamethoxazole Anaphylaxis  . Cymbalta [Duloxetine Hcl] Other (See Comments)    Dizziness, syncope, and sleepiness     Medications:  Current Outpatient Prescriptions  Medication Sig Dispense Refill  . acetaminophen (TYLENOL) 500 MG tablet Take 500-1,000 mg by mouth every 4 (four)  hours as needed. For pain    . albuterol (VENTOLIN HFA) 108 (90 BASE) MCG/ACT inhaler Inhale 2 puffs into the lungs every 6 (six) hours as needed for wheezing or shortness of breath. 1 Inhaler 1  . Blood Glucose Monitoring Suppl (ONE TOUCH ULTRA SYSTEM KIT) W/DEVICE KIT Use device to check blood sugar. 1 each 0  . cycloSPORINE (RESTASIS) 0.05 % ophthalmic emulsion 1 drop 2 (two) times daily.    . diphenhydrAMINE (BENADRYL) 25 MG tablet Take 25  mg by mouth every 6 (six) hours as needed for allergies.     . DOCUSATE SODIUM PO Take 100 mg by mouth daily as needed (constipation). Reported on 01/26/2015    . esomeprazole (NEXIUM) 10 MG packet Take 10 mg by mouth daily before breakfast.    . etanercept (ENBREL) 50 MG/ML injection Inject 50 mg into the skin once a week.    . ferrous sulfate 325 (65 FE) MG tablet Take 1 tablet (325 mg total) by mouth 2 (two) times daily with a meal. 180 tablet 1  . fish oil-omega-3 fatty acids 1000 MG capsule Take 1 g by mouth 2 (two) times daily.     . fluticasone (FLONASE) 50 MCG/ACT nasal spray Place 1 spray into both nostrils daily.    . folic acid (FOLVITE) 1 MG tablet Take 1 mg by mouth daily.    Marland Kitchen gabapentin (NEURONTIN) 300 MG capsule Take 1 capsule (300 mg total) by mouth 4 (four) times daily. 360 capsule 1  . glucose blood (FREESTYLE LITE) test strip Check blood sugar no more than twice daily. 100 each 12  . hydrocortisone 2.5 % cream Apply 1 application topically daily as needed (itching). Reported on 01/26/2015    . ketoconazole (NIZORAL) 2 % cream Apply 1 application topically daily. 30 g 1  . lidocaine (LIDODERM) 5 % Place 2 patches onto the skin daily. (Patient taking differently: Place 2 patches onto the skin daily as needed (pain). ) 90 patch 3  . Menthol, Topical Analgesic, (BIOFREEZE ROLL-ON EX) Apply 1 application topically daily as needed (pain).     . metFORMIN (GLUCOPHAGE) 1000 MG tablet Take 1 tablet (1,000 mg total) by mouth 2 (two) times daily with a  meal. 180 tablet 1  . Methotrexate, PF, 20 MG/0.4ML SOAJ Inject into the skin. Every Thursday    . MYRBETRIQ 50 MG TB24 tablet Take 50 mg by mouth daily.     . pravastatin (PRAVACHOL) 40 MG tablet Take 1 tablet (40 mg total) by mouth at bedtime. 30 tablet 3  . PROCTOSOL HC 2.5 % rectal cream Place rectally 2 (two) times daily. Reported on 01/26/2015 30 g 2  . Psyllium (METAMUCIL PO) Take 1 tablet by mouth daily.     . Simethicone (GAS-X PO) Take 1 tablet by mouth 3 (three) times daily as needed (gas). Reported on 01/26/2015    . Tamsulosin HCl (FLOMAX) 0.4 MG CAPS Take 0.4 mg by mouth daily after supper.     . topiramate (TOPAMAX) 50 MG tablet Take 1 tab PO in the AM and 2 tabs at bedtime for 1 week then decrease to 1 tablet PO BID 90 tablet 5  . verapamil (CALAN-SR) 120 MG CR tablet Take 1 tablet (120 mg total) by mouth at bedtime. 90 tablet 1   Current Facility-Administered Medications  Medication Dose Route Frequency Provider Last Rate Last Dose  . 0.9 %  sodium chloride infusion  500 mL Intravenous Continuous Armbruster, Renelda Loma, MD        Review of Systems: Review of Systems  Constitutional: Positive for fatigue and unexpected weight change. Negative for appetite change, chills, diaphoresis and fever.  HENT:  Negative.   Eyes: Negative.   Respiratory: Negative.   Cardiovascular: Negative.   Gastrointestinal: Positive for blood in stool and diarrhea. Negative for abdominal distention, abdominal pain, constipation, nausea, rectal pain and vomiting.  Endocrine: Negative.   Genitourinary: Negative.    Musculoskeletal: Negative.   Skin: Negative.   Neurological: Negative.  Hematological: Negative.   Psychiatric/Behavioral: Negative.   All other systems reviewed and are negative.    PHYSICAL EXAMINATION Blood pressure 115/63, pulse 89, temperature 98 F (36.7 C), temperature source Oral, resp. rate 17, height 5' 3.5" (1.613 m), weight 123 lb 1.6 oz (55.8 kg), SpO2 95 %.  ECOG  PERFORMANCE STATUS: 1 - Symptomatic but completely ambulatory  Physical Exam  Constitutional: He is oriented to person, place, and time and well-developed, well-nourished, and in no distress. No distress.  HENT:  Head: Normocephalic and atraumatic.  Mouth/Throat: Oropharynx is clear and moist. No oropharyngeal exudate.  Eyes: Pupils are equal, round, and reactive to light. EOM are normal. No scleral icterus.  Neck: No thyromegaly present.  Cardiovascular: Normal rate, regular rhythm and normal heart sounds.   No murmur heard. Pulmonary/Chest: Effort normal and breath sounds normal. No respiratory distress. He has no wheezes. He has no rales.  Abdominal: Soft. He exhibits no mass. There is no tenderness.  Musculoskeletal: Normal range of motion. He exhibits no edema.  Lymphadenopathy:    He has no cervical adenopathy.  Neurological: He is alert and oriented to person, place, and time. He has normal reflexes.  Skin: Skin is warm. No rash noted. He is not diaphoretic. No erythema. No pallor.     LABORATORY DATA: I have personally reviewed the data as listed: No visits with results within 1 Week(s) from this visit.  Latest known visit with results is:  Appointment on 08/16/2016  Component Date Value Ref Range Status  . WBC 08/16/2016 6.9  4.0 - 10.3 10e3/uL Final  . NEUT# 08/16/2016 3.7  1.5 - 6.5 10e3/uL Final  . HGB 08/16/2016 12.2* 13.0 - 17.1 g/dL Final  . HCT 08/16/2016 37.7* 38.4 - 49.9 % Final  . Platelets 08/16/2016 267  140 - 400 10e3/uL Final  . MCV 08/16/2016 95.7  79.3 - 98.0 fL Final  . MCH 08/16/2016 31.0  27.2 - 33.4 pg Final  . MCHC 08/16/2016 32.4  32.0 - 36.0 g/dL Final  . RBC 08/16/2016 3.94* 4.20 - 5.82 10e6/uL Final  . RDW 08/16/2016 15.9* 11.0 - 14.6 % Final  . lymph# 08/16/2016 2.3  0.9 - 3.3 10e3/uL Final  . MONO# 08/16/2016 0.6  0.1 - 0.9 10e3/uL Final  . Eosinophils Absolute 08/16/2016 0.2  0.0 - 0.5 10e3/uL Final  . Basophils Absolute 08/16/2016 0.0  0.0  - 0.1 10e3/uL Final  . NEUT% 08/16/2016 53.3  39.0 - 75.0 % Final  . LYMPH% 08/16/2016 33.5  14.0 - 49.0 % Final  . MONO% 08/16/2016 9.3  0.0 - 14.0 % Final  . EOS% 08/16/2016 3.3  0.0 - 7.0 % Final  . BASO% 08/16/2016 0.6  0.0 - 2.0 % Final  . Retic % 08/16/2016 1.35  0.80 - 1.80 % Final  . Retic Ct Abs 08/16/2016 53.19  34.80 - 93.90 10e3/uL Final  . Immature Retic Fract 08/16/2016 7.10  3.00 - 10.60 % Final  . Polychromasia 08/16/2016 Slight  Slight Final  . Ovalocytes 08/16/2016 Few  Negative Final  . Acanthocytes 08/16/2016 Occ  Negative Final  . Helmet Cell 08/16/2016 Occ   Negative Final  . PLT EST 08/16/2016 Adequate  Adequate Final  . LDH 08/16/2016 177  125 - 245 U/L Final  . Sodium 08/16/2016 142  136 - 145 mEq/L Final  . Potassium 08/16/2016 4.2  3.5 - 5.1 mEq/L Final  . Chloride 08/16/2016 108  98 - 109 mEq/L Final  . CO2 08/16/2016 27  22 -  29 mEq/L Final  . Glucose 08/16/2016 72  70 - 140 mg/dl Final   Glucose reference range is for nonfasting patients. Fasting glucose reference range is 70- 100.  Marland Kitchen BUN 08/16/2016 17.8  7.0 - 26.0 mg/dL Final  . Creatinine 08/16/2016 0.9  0.7 - 1.3 mg/dL Final  . Total Bilirubin 08/16/2016 0.42  0.20 - 1.20 mg/dL Final  . Alkaline Phosphatase 08/16/2016 57  40 - 150 U/L Final  . AST 08/16/2016 15  5 - 34 U/L Final  . ALT 08/16/2016 15  0 - 55 U/L Final  . Total Protein 08/16/2016 6.7  6.4 - 8.3 g/dL Final  . Albumin 08/16/2016 4.0  3.5 - 5.0 g/dL Final  . Calcium 08/16/2016 10.0  8.4 - 10.4 mg/dL Final  . Anion Gap 08/16/2016 7  3 - 11 mEq/L Final  . EGFR 08/16/2016 86* >90 ml/min/1.73 m2 Final   eGFR is calculated using the CKD-EPI Creatinine Equation (2009)  . Haptoglobin 08/16/2016 145  34 - 200 mg/dL Final  . Coombs', Direct 08/16/2016 Negative  Negative Final  . Sedimentation Rate-Westergren 08/16/2016 5  0 - 30 mm/hr Final  . CRP 08/16/2016 0.3  0.0 - 4.9 mg/L Final  . H. pylori, IgA Abs 08/16/2016 <9.0  0.0 - 8.9 units Final    Comment:                                                Negative          <9.0                                                Equivocal   9.0 - 11.0                                                Positive         >11.0   . H. pylori, IgG AbS 08/16/2016 <0.80  0.00 - 0.79 Index Value Final   Comment:                                             Negative           <0.80                                             Equivocal    0.80 - 0.89                                             Positive           >0.89        Ardath Sax, MD

## 2016-08-27 ENCOUNTER — Encounter: Payer: Self-pay | Admitting: Gastroenterology

## 2016-08-27 ENCOUNTER — Ambulatory Visit (INDEPENDENT_AMBULATORY_CARE_PROVIDER_SITE_OTHER): Payer: BLUE CROSS/BLUE SHIELD | Admitting: Gastroenterology

## 2016-08-27 VITALS — BP 102/60 | HR 80 | Ht 63.5 in | Wt 124.8 lb

## 2016-08-27 DIAGNOSIS — K648 Other hemorrhoids: Secondary | ICD-10-CM | POA: Diagnosis not present

## 2016-08-27 NOTE — Patient Instructions (Addendum)
If you are age 70 or older, your body mass index should be between 23-30. Your Body mass index is 21.76 kg/m. If this is out of the aforementioned range listed, please consider follow up with your Primary Care Provider.  If you are age 41 or younger, your body mass index should be between 19-25. Your Body mass index is 21.76 kg/m. If this is out of the aformentioned range listed, please consider follow up with your Primary Care Provider.   HEMORRHOID BANDING PROCEDURE    FOLLOW-UP CARE   1. The procedure you have had should have been relatively painless since the banding of the area involved does not have nerve endings and there is no pain sensation.  The rubber band cuts off the blood supply to the hemorrhoid and the band may fall off as soon as 48 hours after the banding (the band may occasionally be seen in the toilet bowl following a bowel movement). You may notice a temporary feeling of fullness in the rectum which should respond adequately to plain Tylenol or Motrin.  2. Following the banding, avoid strenuous exercise that evening and resume full activity the next day.  A sitz bath (soaking in a warm tub) or bidet is soothing, and can be useful for cleansing the area after bowel movements.     3. To avoid constipation, take two tablespoons of natural wheat bran, natural oat bran, flax, Benefiber or any over the counter fiber supplement and increase your water intake to 7-8 glasses daily.    4. Unless you have been prescribed anorectal medication, do not put anything inside your rectum for two weeks: No suppositories, enemas, fingers, etc.  5. Occasionally, you may have more bleeding than usual after the banding procedure.  This is often from the untreated hemorrhoids rather than the treated one.  Don't be concerned if there is a tablespoon or so of blood.  If there is more blood than this, lie flat with your bottom higher than your head and apply an ice pack to the area. If the bleeding  does not stop within a half an hour or if you feel faint, call our office at [phone] or go to the emergency room.  6. Problems are not common; however, if there is a substantial amount of bleeding, severe pain, chills, fever or difficulty passing urine (very rare) or other problems, you should call us at [phone] or report to the nearest emergency room.  7. Do not stay seated continuously for more than 2-3 hours for a day or two after the procedure.  Tighten your buttock muscles 10-15 times every two hours and take 10-15 deep breaths every 1-2 hours.  Do not spend more than a few minutes on the toilet if you cannot empty your bowel; instead re-visit the toilet at a later time.    Please follow up with Dr. Havery Roberts on 09/10/16 at 3:45pm for your 2nd banding. You will need to follow up on 09/24/16 at 4:00pm.  Thank you.

## 2016-08-27 NOTE — Progress Notes (Signed)
PROCEDURE NOTE: The patient presents with symptomatic grade II  hemorrhoids, requesting rubber band ligation of his/her hemorrhoidal disease.  All risks, benefits and alternative forms of therapy were described and informed consent was obtained.  In the Left Lateral Decubitus position anoscopic examination revealed grade II hemorrhoids in the all positions.  The anorectum was pre-medicated with 0.125% nitroglycerin The decision was made to band the RP internal hemorrhoid, and the Crisman was used to perform band ligation without complication.  Digital anorectal examination was then performed to assure proper positioning of the band, and to adjust the banded tissue as required.  The patient was discharged home without pain or other issues.  Dietary and behavioral recommendations were given and along with follow-up instructions.      The patient will return in 2-3 weeks for  follow-up and possible additional banding as required. No complications were encountered and the patient tolerated the procedure well.  St. Bernard Cellar, MD [facility] Pager [phone]

## 2016-08-29 ENCOUNTER — Encounter: Payer: Self-pay | Admitting: Internal Medicine

## 2016-08-29 ENCOUNTER — Ambulatory Visit (INDEPENDENT_AMBULATORY_CARE_PROVIDER_SITE_OTHER): Payer: BLUE CROSS/BLUE SHIELD | Admitting: Internal Medicine

## 2016-08-29 VITALS — BP 126/62 | HR 74 | Temp 98.2°F | Resp 14 | Ht 64.0 in | Wt 125.4 lb

## 2016-08-29 DIAGNOSIS — D509 Iron deficiency anemia, unspecified: Secondary | ICD-10-CM | POA: Diagnosis not present

## 2016-08-29 DIAGNOSIS — E119 Type 2 diabetes mellitus without complications: Secondary | ICD-10-CM

## 2016-08-29 MED ORDER — GLUCOSE BLOOD VI STRP: ORAL_STRIP | 12 refills | Status: DC

## 2016-08-29 MED ORDER — FREESTYLE LITE DEVI: 0 refills | Status: DC

## 2016-08-29 MED ORDER — FREESTYLE LANCETS MISC
12 refills | Status: DC
Start: 2016-08-29 — End: 2016-09-02

## 2016-08-29 NOTE — Patient Instructions (Signed)
GO TO THE LAB : Get the blood work    Please take a flu shot this fall

## 2016-08-29 NOTE — Progress Notes (Signed)
Subjective:    Patient ID: Christian Roberts, male    DOB: Jul 08, 1945, 71 y.o.   MRN: [id number]  DOS:  08/29/2016 Type of visit - description : rov Interval history: Since the last office visit, he has seen several doctors, chart reviewed Headaches: They continue to be well-controlled, neurologist decreasing Topamax. High cholesterol: Currently not taking a statin's Complaint right leg pain, for the last 6 months, he thinks related to taking Crestor, pain is at the pretibial and calf areas. No swelling No claudication per se, the pain is steady and gets better "once I being distracted".   Review of Systems   Past Medical History:  Diagnosis Date  . Allergic rhinitis   . Anemia   . Asthma    "I outgrew it"  . Blood transfusion without reported diagnosis   . Carpal tunnel syndrome   . Cataract   . Chronic rheumatic arthritis ([place]) 01/20/2014   Per Dr. Amil Amen   . Complete tear of rotator cuff 2016   Right, MRI in 2016, complete rupture of long head of the biceps as well as a tear of the subscapularis and supraspinatus tendons, Dr. Mardelle Matte  . Complication of anesthesia   . Constipation   . Diabetes mellitus    2012  . Elevated PSA   . GERD (gastroesophageal reflux disease)   . Headache(784.0)    headaches-related to sinuses & uses Tylenol   . Hemorrhoids   . Hernia   . Hyperlipidemia   . Nocturia   . Numbness of fingers   . Osteoarthritis    ankle- left, hands & neck & hip  . PONV (postoperative nausea and vomiting)    WITH ETHER  . Skin cancer, basal cell    sees derm    Past Surgical History:  Procedure Laterality Date  . ANKLE FUSION Left 03-19-13   Dr Mardelle Matte  . ANKLE SURGERY Left   . APPENDECTOMY     1964  . BACK SURGERY     cerv. fusion C1- T1, 2 separate surgeries , T1-T3 2014  . CARPAL TUNNEL RELEASE Right   . CARPAL TUNNEL RELEASE Left 07-14-2014   [place]  . COLONOSCOPY N/A 02/11/2016   Procedure: COLONOSCOPY;  Surgeon: Ladene Artist, MD;  Location:  [facility];  Service: Endoscopy;  Laterality: N/A;  . CYSTOSCOPY     multiple times for infections  . INGUINAL HERNIA REPAIR  03/27/2011   Procedure: HERNIA REPAIR INGUINAL ADULT;  Surgeon: Harl Bowie, MD;  Location: [facility];  Service: General;  Laterality: Left;  . INGUINAL HERNIA REPAIR Bilateral 12/09/2012   Procedure: LAPAROSCOPIC BILATERAL INGUINAL HERNIA REPAIR WITH MESH;  Surgeon: Harl Bowie, MD;  Location: [facility];  Service: General;  Laterality: Bilateral;  . INSERTION OF MESH Bilateral 12/09/2012   Procedure: INSERTION OF MESH;  Surgeon: Harl Bowie, MD;  Location: [facility];  Service: General;  Laterality: Bilateral;  . left ankle mass removed  1972   3 surgeries on L ankle- 73, 2008, 2009  . NECK SURGERY  03/11,    C3-C7  . RECTAL SURGERY  at birth   "[place]" SURGICAL CORRECTION  . ROTATOR CUFF REPAIR W/ DISTAL CLAVICLE EXCISION Right 08/25/2014   And acromioplasty, Dr. Mardelle Matte  . SHOULDER SURGERY  09/17/12  . TONSILLECTOMY    . VARICOCELECTOMY      Social History   Social History  . Marital status: Married    Spouse name: Arbie Cookey  . Number of children:  3  . Years of education: BA+   Occupational History  . 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 Smoking status: Current Every Day Smoker    Packs/day: 0.25    Types: Cigarettes    Last attempt to quit: 04/10/2012  . Smokeless tobacco: Never Used     Comment: no since 02-2016  . Alcohol use No  . Drug use: No  . Sexual activity: Not Currently   Other Topics Concern  . Not on file   Social History Narrative   Married , lives w/ wife   Right-handed   Rare caffeine intake         Allergies as of 08/29/2016      Reactions   Aspirin Anaphylaxis   Codeine Phosphate Anaphylaxis   Ibuprofen Anaphylaxis   Levofloxacin Itching, Anaphylaxis   Menthol Swelling   Monosodium Glutamate Diarrhea, Nausea And Vomiting   Other reaction(s): Cramps (ALLERGY/intolerance)    Morphine Sulfate Anaphylaxis   Naproxen Sodium Anaphylaxis   Nsaids Anaphylaxis   Other Itching   **BAND-AID**   Sulfamethoxazole Anaphylaxis   Cymbalta [duloxetine Hcl] Other (See Comments)   Dizziness, syncope, and sleepiness       Medication List       Accurate as of 08/29/16  7:16 PM. Always use your most recent med list.          acetaminophen 500 MG tablet Commonly known as:  TYLENOL Take 500-1,000 mg by mouth every 4 (four) hours as needed. For pain   albuterol 108 (90 Base) MCG/ACT inhaler Commonly known as:  VENTOLIN HFA Inhale 2 puffs into the lungs every 6 (six) hours as needed for wheezing or shortness of breath.   BENADRYL 25 MG tablet Generic drug:  diphenhydrAMINE Take 25 mg by mouth every 6 (six) hours as needed for allergies.   BIOFREEZE ROLL-ON EX Apply 1 application topically daily as needed (pain).   cycloSPORINE 0.05 % ophthalmic emulsion Commonly known as:  RESTASIS 1 drop 2 (two) times daily.   DOCUSATE SODIUM PO Take 100 mg by mouth daily as needed (constipation). Reported on 01/26/2015   ENBREL 50 MG/ML injection Generic drug:  etanercept Inject 50 mg into the skin once a week.   esomeprazole 10 MG packet Commonly known as:  NEXIUM Take 10 mg by mouth daily before breakfast.   ferrous sulfate 325 (65 FE) MG tablet Take 1 tablet (325 mg total) by mouth 2 (two) times daily with a meal.   fish oil-omega-3 fatty acids 1000 MG capsule Take 1 g by mouth 2 (two) times daily.   fluticasone 50 MCG/ACT nasal spray Commonly known as:  FLONASE Place 1 spray into both nostrils daily.   folic acid 1 MG tablet Commonly known as:  FOLVITE Take 1 mg by mouth daily.   freestyle lancets Check blood sugar no more than twice daily   FREESTYLE LITE Devi Check blood sugar no more than twice daily   gabapentin 300 MG capsule Commonly known as:  NEURONTIN Take 1 capsule (300 mg total) by mouth 4 (four) times daily.   GAS-X PO Take 1 tablet by  mouth 3 (three) times daily as needed (gas). Reported on 01/26/2015   glucose blood test strip Commonly known as:  FREESTYLE LITE Check blood sugar no more than twice daily.   hydrocortisone 2.5 % cream Apply 1 application topically daily as needed (itching). Reported on 01/26/2015   ketoconazole 2 % cream Commonly known as:  NIZORAL Apply 1  application topically daily.   lidocaine 5 % Commonly known as:  LIDODERM Place 2 patches onto the skin daily.   METAMUCIL PO Take 1 tablet by mouth daily.   metFORMIN 1000 MG tablet Commonly known as:  GLUCOPHAGE Take 1 tablet (1,000 mg total) by mouth 2 (two) times daily with a meal.   Methotrexate (PF) 20 MG/0.4ML Soaj Inject into the skin. Every Thursday   MYRBETRIQ 50 MG Tb24 tablet Generic drug:  mirabegron ER Take 50 mg by mouth daily.   PROCTOSOL HC 2.5 % rectal cream Generic drug:  hydrocortisone Place rectally 2 (two) times daily. Reported on 01/26/2015   tamsulosin 0.4 MG Caps capsule Commonly known as:  FLOMAX Take 0.4 mg by mouth daily after supper.   topiramate 50 MG tablet Commonly known as:  TOPAMAX Take 1 tab PO in the AM and 2 tabs at bedtime for 1 week then decrease to 1 tablet PO BID   verapamil 120 MG CR tablet Commonly known as:  CALAN-SR Take 1 tablet (120 mg total) by mouth at bedtime.            Discharge Care Instructions        Start     Ordered   08/29/16 0000  Hemoglobin A1c     08/29/16 1438   08/29/16 0000  Blood Glucose Monitoring Suppl (FREESTYLE LITE) DEVI    Comments:  Dx: E11.9   08/29/16 1457   08/29/16 0000  glucose blood (FREESTYLE LITE) test strip    Comments:  Dx: E11.9   08/29/16 1457   08/29/16 0000  Lancets (FREESTYLE) lancets    Comments:  Dx: E11.9   08/29/16 1457         Objective:   Physical Exam BP 126/62 (BP Location: Left Arm, Patient Position: Sitting, Cuff Size: Small)   Pulse 74   Temp 98.2 F (36.8 C) (Oral)   Resp 14   Ht 5\' 4"  (1.626 m)   Wt 125  lb 6 oz (56.9 kg)   SpO2 96%   BMI 21.52 kg/m  General:   Well developed, well nourished . NAD.  HEENT:  Normocephalic . Face symmetric, atraumatic Lungs:  CTA B Normal respiratory effort, no intercostal retractions, no accessory muscle use. Heart: RRR,  no murmur.  No pretibial edema bilaterally  Right lower extremity: Normal to inspection and palpation. Good pedal pulses, calf is no TTP. Is symmetric  compared to the left. Skin: Not pale. Not jaundice Neurologic:  alert & oriented X3.  Speech normal, gait appropriate for age and unassisted Psych--  Cognition and judgment appear intact.  Cooperative with normal attention span and concentration.  Behavior appropriate. No anxious or depressed appearing.      Assessment & Plan:   Assessment  DM 2012 Hyperlipidemia zocor  GERD h/o bronchospasm Abnormal gallbladder per CT-- Saw surgery 12-16: No surgery rx MSK: ---Pain mngmt: reports a severe allergic reaction to  NSAIDs-codeine-MSO4 ; can tolerate oxycodone but the pain relief is just mild ; intolerant to Cymbalta 10-2015 ---Rheumatoid arthritis -- on enbrel - MTX (Dr Elpidio Galea) ---DJD multiple sites, cervical spondylosis -- gabapentin ---CTS ---neuropathy on-off, hands R>L Headaches --topamax, verapamil started 12-2015 w/very good results GU: sees urology --Elevated PSA --BPH Skin cancer, BCC GI:  Iron deficiency, colonoscopy and EGD done 02/08/2016 (multiple colon polyps, EGD -- see report, bx mild chronic inflammation) Admitted with RBPR 02/10/2016 >> repeated Cscope  PLAN: DM: Diet control. Check a A1c Hyperlipidemia: See previous visit, was supposed to start  Pravachol but did not.  Rec to reassess the issue with his new primary doctor in few months. Anemia: Extensively evaluated by GI and hematology, problem felt to be a combination of bone marrow suppression due to use of methotrexate and Enbrel and iron deficiency due to recurrent gastrointestinal bleeding from  hemorrhoids.  Last hemoglobin is stable. Right leg pain: For 6 months, exam is actually benign with no swelling, calf symmetric, good pedal pulses. No claudication. He has some baseline back pain. Etiology unclear, advised to be further eval possibly by orthopedic surgery. Patient is moving to [place] soon. I wish him the best. To call for records when needed.

## 2016-08-29 NOTE — Assessment & Plan Note (Signed)
DM: Diet control. Check a A1c Hyperlipidemia: See previous visit, was supposed to start Pravachol but did not.  Rec to reassess the issue with his new primary doctor in few months. Anemia: Extensively evaluated by GI and hematology, problem felt to be a combination of bone marrow suppression due to use of methotrexate and Enbrel and iron deficiency due to recurrent gastrointestinal bleeding from hemorrhoids.  Last hemoglobin is stable. Right leg pain: For 6 months, exam is actually benign with no swelling, calf symmetric, good pedal pulses. No claudication. He has some baseline back pain. Etiology unclear, advised to be further eval possibly by orthopedic surgery. Patient is moving to [place] soon. I wish him the best. To call for records when needed.

## 2016-08-29 NOTE — Progress Notes (Signed)
Pre visit review using our clinic review tool, if applicable. No additional management support is needed unless otherwise documented below in the visit note. 

## 2016-08-30 ENCOUNTER — Ambulatory Visit: Payer: BLUE CROSS/BLUE SHIELD | Admitting: Internal Medicine

## 2016-08-30 LAB — HEMOGLOBIN A1C: HEMOGLOBIN A1C: 6.4 % (ref 4.6–6.5)

## 2016-09-02 ENCOUNTER — Telehealth: Payer: Self-pay | Admitting: Internal Medicine

## 2016-09-02 MED ORDER — ONETOUCH LANCETS MISC: 12 refills | Status: DC

## 2016-09-02 MED ORDER — GLUCOSE BLOOD VI STRP: ORAL_STRIP | 12 refills | Status: DC

## 2016-09-02 MED ORDER — ONETOUCH VERIO FLEX SYSTEM W/DEVICE KIT: 1.0000 | PACK | Freq: Once | 0 refills | Status: AC

## 2016-09-02 NOTE — Telephone Encounter (Signed)
Spoke w/ Pt, informed of lab results. Freestyle device and supplies not covered, Onetouch Flex device and supplies [facility].

## 2016-09-02 NOTE — Telephone Encounter (Signed)
Pt is requesting a call back for his lab results.   ALSO, pt says that his insurance will only pay for his One touch meter not the new suggested on by PCP. Pt would like to have it sent in to pharmacy   CB: [phone]

## 2016-09-05 ENCOUNTER — Encounter: Payer: BLUE CROSS/BLUE SHIELD | Admitting: Gastroenterology

## 2016-09-05 ENCOUNTER — Telehealth: Payer: Self-pay | Admitting: Adult Health

## 2016-09-05 NOTE — Telephone Encounter (Signed)
Pt states that Monday of this week was his last day taking the topiramate (TOPAMAX) 50 MG tablet and since Monday his head aches have returned.  Pt is asking to be called

## 2016-09-05 NOTE — Telephone Encounter (Signed)
Spoke with patient and advised him that MM, NP advised he restart topamax taking 1 tab, 50 mg daily to help with his headache. Advised he wait a few days and call back next week (office closed for Labor Day) and give update to RN. Patient stated that was what he thought he should do but wanted go ahead from NP. He verbalized understanding, appreciation.

## 2016-09-05 NOTE — Telephone Encounter (Signed)
Yes restart at 50 mg daily.

## 2016-09-10 ENCOUNTER — Encounter (INDEPENDENT_AMBULATORY_CARE_PROVIDER_SITE_OTHER): Payer: Self-pay

## 2016-09-10 ENCOUNTER — Ambulatory Visit (INDEPENDENT_AMBULATORY_CARE_PROVIDER_SITE_OTHER): Payer: BLUE CROSS/BLUE SHIELD | Admitting: Gastroenterology

## 2016-09-10 ENCOUNTER — Encounter: Payer: Self-pay | Admitting: Gastroenterology

## 2016-09-10 VITALS — BP 120/56 | HR 80 | Ht 63.5 in | Wt 123.4 lb

## 2016-09-10 DIAGNOSIS — K648 Other hemorrhoids: Secondary | ICD-10-CM

## 2016-09-10 NOTE — Progress Notes (Signed)
Patient is here for follow up after first hemorrhoid banding of RP hemorrhoid on 8/21. He tolerated it well, he thinks rectal bleeding has improved and responded well thus far. He is here for second banding today  PROCEDURE NOTE: The patient presents with symptomatic grade II  hemorrhoids, requesting rubber band ligation of his/her hemorrhoidal disease.  All risks, benefits and alternative forms of therapy were described and informed consent was obtained.   The anorectum was pre-medicated with 0.125% nitroglycerin The decision was made to band the LL internal hemorrhoid, and the [place] was used to perform band ligation without complication.  Digital anorectal examination was then performed to assure proper positioning of the band, and to adjust the banded tissue as required.  The patient was discharged home without pain or other issues.  Dietary and behavioral recommendations were given and along with follow-up instructions.     The patient will return in 2-4 weeks for  follow-up and possible additional banding as required. No complications were encountered and the patient tolerated the procedure well.  Florence Cellar, MD [facility] Gastroenterology Pager [phone]

## 2016-09-10 NOTE — Patient Instructions (Addendum)
You are scheduled for your next hemorrhoidal banding 09/24/16 at 4:00 pm.  HEMORRHOID BANDING PROCEDURE    FOLLOW-UP CARE   1. The procedure you have had should have been relatively painless since the banding of the area involved does not have nerve endings and there is no pain sensation.  The rubber band cuts off the blood supply to the hemorrhoid and the band may fall off as soon as 48 hours after the banding (the band may occasionally be seen in the toilet bowl following a bowel movement). You may notice a temporary feeling of fullness in the rectum which should respond adequately to plain Tylenol or Motrin.  2. Following the banding, avoid strenuous exercise that evening and resume full activity the next day.  A sitz bath (soaking in a warm tub) or bidet is soothing, and can be useful for cleansing the area after bowel movements.     3. To avoid constipation, take two tablespoons of natural wheat bran, natural oat bran, flax, Benefiber or any over the counter fiber supplement and increase your water intake to 7-8 glasses daily.    4. Unless you have been prescribed anorectal medication, do not put anything inside your rectum for two weeks: No suppositories, enemas, fingers, etc.  5. Occasionally, you may have more bleeding than usual after the banding procedure.  This is often from the untreated hemorrhoids rather than the treated one.  Don't be concerned if there is a tablespoon or so of blood.  If there is more blood than this, lie flat with your bottom higher than your head and apply an ice pack to the area. If the bleeding does not stop within a half an hour or if you feel faint, call our office at [phone] or go to the emergency room.  6. Problems are not common; however, if there is a substantial amount of bleeding, severe pain, chills, fever or difficulty passing urine (very rare) or other problems, you should call us at [phone] or report to the nearest emergency  room.  7. Do not stay seated continuously for more than 2-3 hours for a day or two after the procedure.  Tighten your buttock muscles 10-15 times every two hours and take 10-15 deep breaths every 1-2 hours.  Do not spend more than a few minutes on the toilet if you cannot empty your bowel; instead re-visit the toilet at a later time.

## 2016-09-10 NOTE — Telephone Encounter (Signed)
Patient called office in reference to still having headaches getting progressively worse.  Patient states he is taking Topamax 50 mg 1 tablet daily and it is not working.  Patient waking up with headaches every single morning, and then again at 4:00pm.  [place].  Please call (Pt has another appointment from 3:00-5:00pm)

## 2016-09-11 NOTE — Telephone Encounter (Signed)
Increase Topamax to 50 mg BID

## 2016-09-11 NOTE — Telephone Encounter (Signed)
Patient called office returning RN's call.  Per Verneita Griffes I advised patient of message below to taking 50mg  twice daily of Topamax.  Patient voiced understanding with no questions and states he will try that.

## 2016-09-11 NOTE — Telephone Encounter (Signed)
LVM informing patient that per NP he may go back up on Topamax, taking 50 mg twice daily. Left number for any questions.

## 2016-09-24 ENCOUNTER — Ambulatory Visit (INDEPENDENT_AMBULATORY_CARE_PROVIDER_SITE_OTHER): Payer: BLUE CROSS/BLUE SHIELD | Admitting: Gastroenterology

## 2016-09-24 ENCOUNTER — Encounter: Payer: Self-pay | Admitting: Gastroenterology

## 2016-09-24 VITALS — BP 104/60 | HR 84 | Ht 63.5 in | Wt 124.1 lb

## 2016-09-24 DIAGNOSIS — K648 Other hemorrhoids: Secondary | ICD-10-CM

## 2016-09-24 DIAGNOSIS — K641 Second degree hemorrhoids: Secondary | ICD-10-CM | POA: Diagnosis not present

## 2016-09-24 NOTE — Patient Instructions (Addendum)
HEMORRHOID BANDING PROCEDURE    FOLLOW-UP CARE   1. The procedure you have had should have been relatively painless since the banding of the area involved does not have nerve endings and there is no pain sensation.  The rubber band cuts off the blood supply to the hemorrhoid and the band may fall off as soon as 48 hours after the banding (the band may occasionally be seen in the toilet bowl following a bowel movement). You may notice a temporary feeling of fullness in the rectum which should respond adequately to plain Tylenol or Motrin.  2. Following the banding, avoid strenuous exercise that evening and resume full activity the next day.  A sitz bath (soaking in a warm tub) or bidet is soothing, and can be useful for cleansing the area after bowel movements.     3. To avoid constipation, take two tablespoons of natural wheat bran, natural oat bran, flax, Benefiber or any over the counter fiber supplement and increase your water intake to 7-8 glasses daily.    4. Unless you have been prescribed anorectal medication, do not put anything inside your rectum for two weeks: No suppositories, enemas, fingers, etc.  5. Occasionally, you may have more bleeding than usual after the banding procedure.  This is often from the untreated hemorrhoids rather than the treated one.  Don't be concerned if there is a tablespoon or so of blood.  If there is more blood than this, lie flat with your bottom higher than your head and apply an ice pack to the area. If the bleeding does not stop within a half an hour or if you feel faint, call our office at [phone] or go to the emergency room.  6. Problems are not common; however, if there is a substantial amount of bleeding, severe pain, chills, fever or difficulty passing urine (very rare) or other problems, you should call us at [phone] or report to the nearest emergency room.  7. Do not stay seated continuously for more than 2-3 hours for a day or two  after the procedure.  Tighten your buttock muscles 10-15 times every two hours and take 10-15 deep breaths every 1-2 hours.  Do not spend more than a few minutes on the toilet if you cannot empty your bowel; instead re-visit the toilet at a later time.   You are due for your next colonoscopy in August of 2020.  If you are age 49 or older, your body mass index should be between 23-30. Your Body mass index is 21.64 kg/m. If this is out of the aforementioned range listed, please consider follow up with your Primary Care Provider.  If you are age 56 or younger, your body mass index should be between 19-25. Your Body mass index is 21.64 kg/m. If this is out of the aformentioned range listed, please consider follow up with your Primary Care Provider.

## 2016-09-24 NOTE — Progress Notes (Signed)
Patient has had a nice response to hemorrhoid banding. Here for last banding today. Feeling well.  On DRE, the rectal vault is angulated and RA area is small without much hemorrhoid tissue. As below, one small band was placed at the site, and then the RP area was banded again as this was the site of largest hemorrhoid  PROCEDURE NOTE: The patient presents with symptomatic grade II  hemorrhoids, requesting rubber band ligation of his/her hemorrhoidal disease.  All risks, benefits and alternative forms of therapy were described and informed consent was obtained.  The anorectum was pre-medicated with 0.125% nitroglycerin ointment The decision was made to band the RA and RP internal hemorrhoid, and the [place] was used to perform band ligation without complication. RA band was placed, however only a small amount of tissue grasped due to anatomy. Another band was placed in the RP area given this area was the site of most of hemorrhoid tissue.  Digital anorectal examination was then performed to assure proper positioning of the band, and to adjust the banded tissue as required.  The patient was discharged home without pain or other issues.  Dietary and behavioral recommendations were given and along with follow-up instructions.     The following adjunctive treatments were recommended: Daily fiber supplement  The patient will return as needed No complications were encountered and the patient tolerated the procedure well.  [place] Cellar, MD [facility] Gastroenterology Pager [phone]

## 2016-09-26 ENCOUNTER — Telehealth: Payer: Self-pay | Admitting: Adult Health

## 2016-09-26 NOTE — Telephone Encounter (Signed)
Pt calling to inform that his head aches are everyday, so much so that he would like to go back to the twice a day 100 mg of the topiramate (TOPAMAX) 50 MG tablet.  Pt would also like to know If he can start back on Rizatriptan Please call

## 2016-09-27 MED ORDER — RIZATRIPTAN BENZOATE 10 MG PO TABS: 10.0000 mg | ORAL_TABLET | Freq: Three times a day (TID) | ORAL | 3 refills | Status: AC | PRN

## 2016-09-27 MED ORDER — TOPIRAMATE 100 MG PO TABS: 100.0000 mg | ORAL_TABLET | Freq: Two times a day (BID) | ORAL | 5 refills | Status: AC

## 2016-09-27 NOTE — Telephone Encounter (Signed)
I called patient. The patient previously had been very well controlled on 100 mg twice daily of the Topamax. Since coming down on the dose, the headaches have come back significantly, they are daily in nature.  I will call in a prescription for the Topamax to go back up to 100 mg twice daily, a prescription for Maxalt was sent in as well.

## 2016-09-27 NOTE — Addendum Note (Signed)
Addended by: Kathrynn Ducking on: 09/27/2016 12:43 PM   Modules accepted: Orders

## 2016-10-15 ENCOUNTER — Other Ambulatory Visit: Payer: Self-pay | Admitting: Internal Medicine

## 2016-11-29 ENCOUNTER — Telehealth: Payer: Self-pay | Admitting: Internal Medicine
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
## 2016-12-02 MED ORDER — GLUCOSE BLOOD VI STRP: ORAL_STRIP | 12 refills | Status: DC

## 2016-12-02 MED ORDER — ONETOUCH LANCETS MISC: 12 refills | Status: DC

## 2016-12-02 MED ORDER — ONETOUCH VERIO FLEX SYSTEM W/DEVICE KIT: 1.0000 | PACK | Freq: Once | 0 refills | Status: AC

## 2016-12-02 NOTE — Telephone Encounter (Signed)
One Touch device, test strips, and lancets [facility].

## 2016-12-03 MED ORDER — ACCU-CHEK FASTCLIX LANCETS MISC: 12 refills | Status: AC

## 2016-12-03 MED ORDER — GLUCOSE BLOOD VI STRP: ORAL_STRIP | 12 refills | Status: AC

## 2016-12-03 MED ORDER — ACCU-CHEK AVIVA DEVI: 0 refills | Status: AC

## 2016-12-03 NOTE — Addendum Note (Signed)
Addended byDamita Dunnings D on: 12/03/2016 03:34 PM   Modules accepted: Orders

## 2016-12-03 NOTE — Telephone Encounter (Signed)
Received fax from [place] in [facility], one touch brand products not covered by [employer]; TrueTrack, TrueMetrix, Prodigy, or Accucheck covered. Will send new meter and supplies.

## 2016-12-19 ENCOUNTER — Ambulatory Visit: Payer: BLUE CROSS/BLUE SHIELD | Admitting: Adult Health

## 2016-12-26 ENCOUNTER — Other Ambulatory Visit: Payer: Self-pay | Admitting: Internal Medicine

## 2017-01-09 ENCOUNTER — Other Ambulatory Visit: Payer: Self-pay | Admitting: Internal Medicine

## 2017-02-05 ENCOUNTER — Other Ambulatory Visit: Payer: Self-pay | Admitting: Internal Medicine

## 2017-03-01 ENCOUNTER — Other Ambulatory Visit: Payer: Self-pay | Admitting: Internal Medicine

## 2018-02-27 IMAGING — CT CT MAXILLOFACIAL W/O CM
3 series · 15 of 47 positions shown, 18 images · non-contrast
Comparison: CT brain scan of 05/09/2007 and MR brain of 04/09/2014

CLINICAL DATA: Chronic headache for several months, sinus pressure

EXAM:
CT MAXILLOFACIAL WITHOUT CONTRAST
TECHNIQUE: Multidetector CT imaging of the maxillofacial structures was
performed. Multiplanar CT image reconstructions were also generated.
A small metallic BB was placed on the right temple in order to
reliably differentiate right from left.

[Series 2: max soft · axial · 0.33mm/px · z∈[+1074,+1222]mm · 9 of 86 slices shown, 12 images]
[im 6/86  brain]
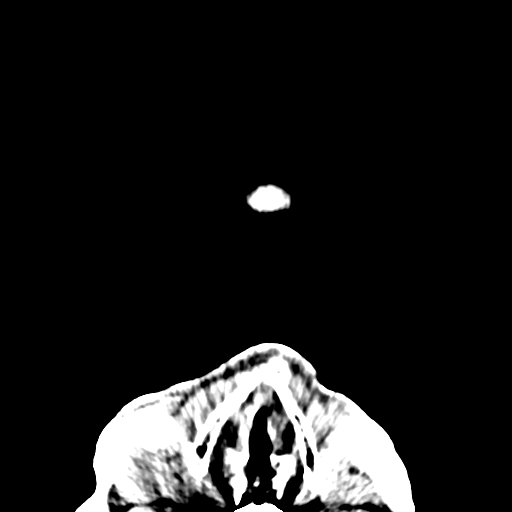
[im 6/86  bone]
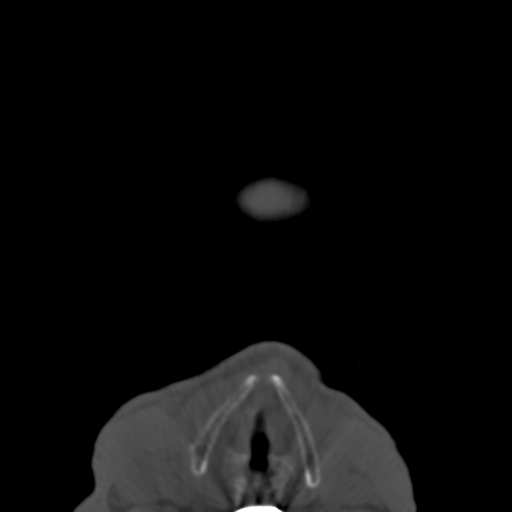
[im 15/86  bone]
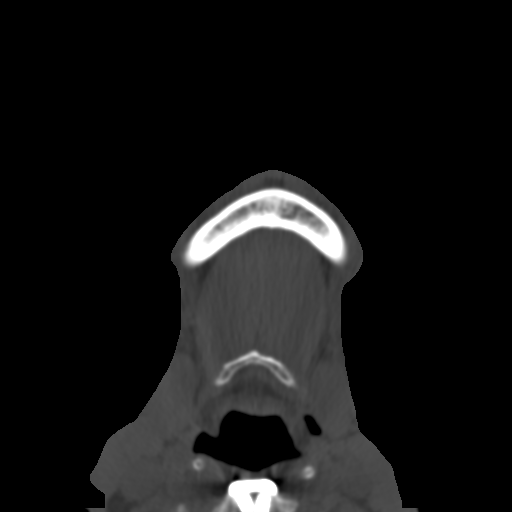
[im 24/86  bone]
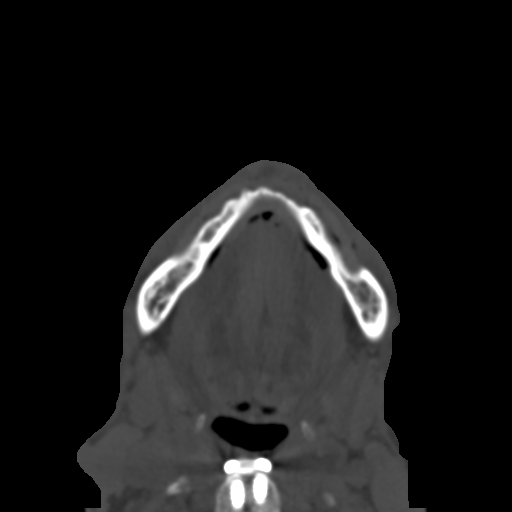
[im 33/86  bone]
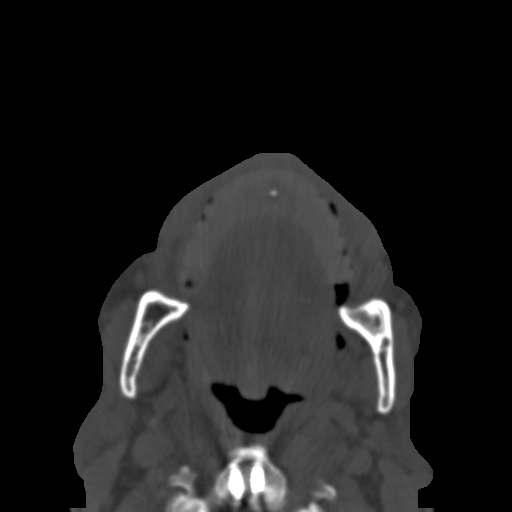
[im 44/86  brain]
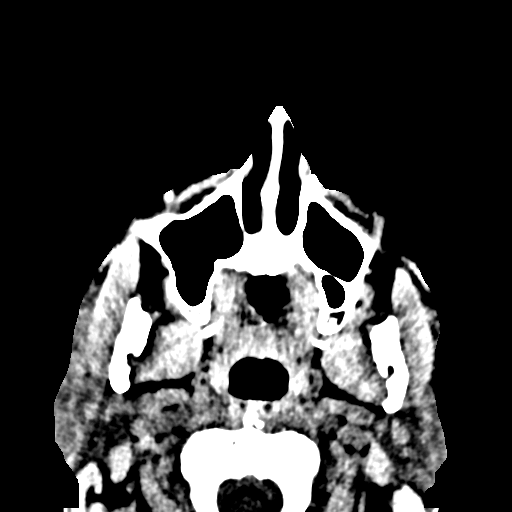
[im 44/86  bone]
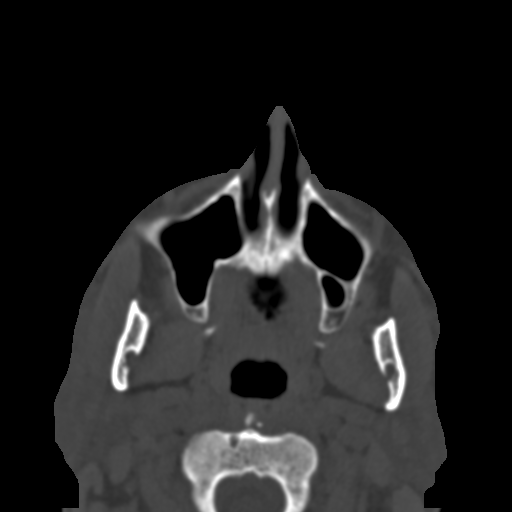
[im 53/86  bone]
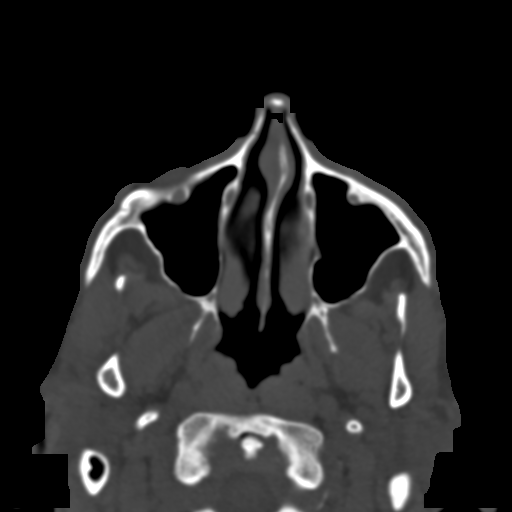
[im 62/86  bone]
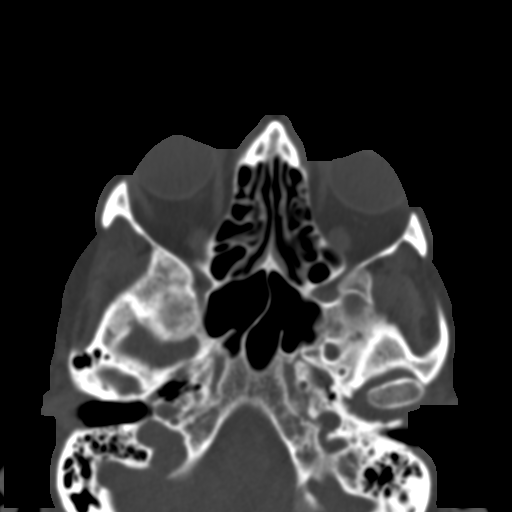
[im 71/86  bone]
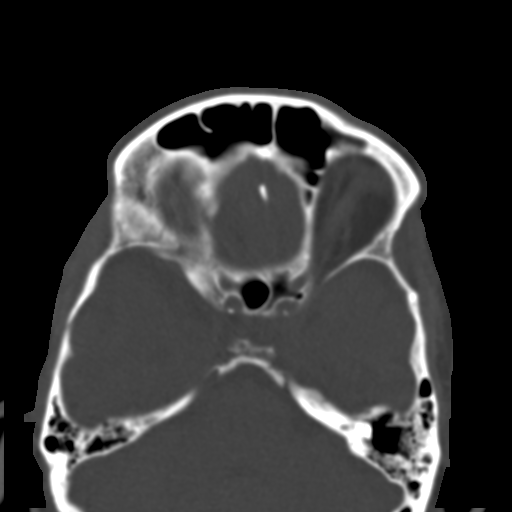
[im 80/86  brain]
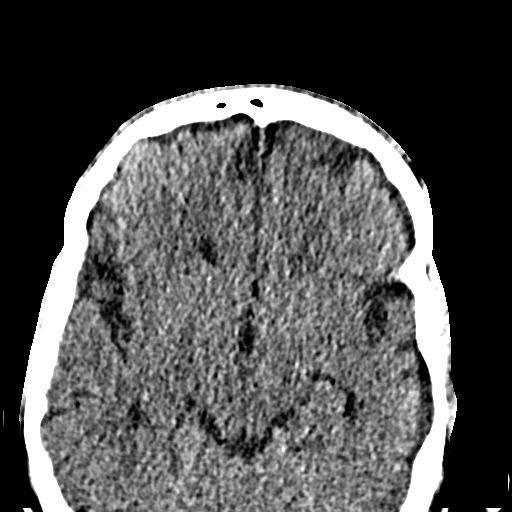
[im 80/86  bone]
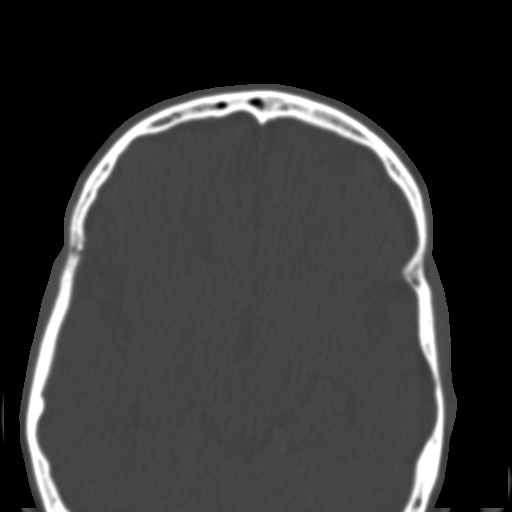

[Series 6: coronal soft · coronal · 0.29mm/px · 3 of 83 slices shown]
[im 28/83  bone]
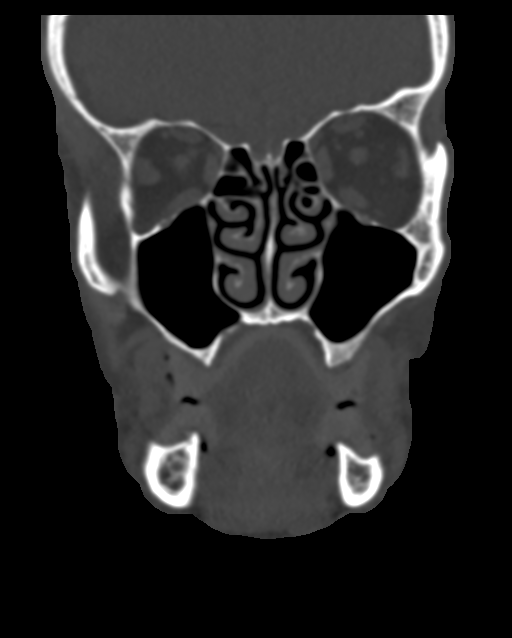
[im 37/83  bone]
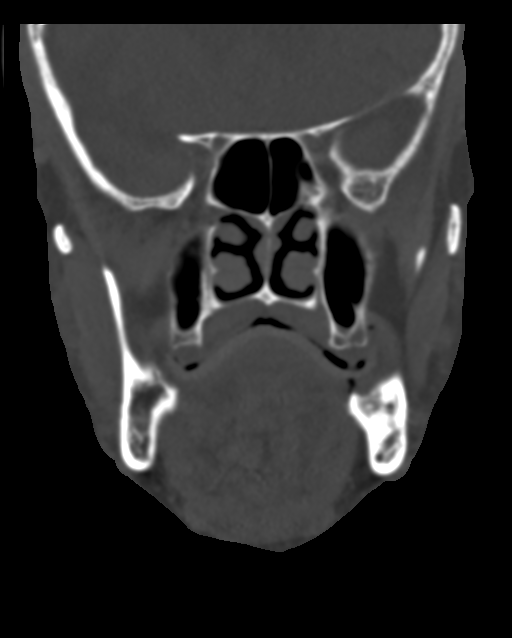
[im 46/83  bone]
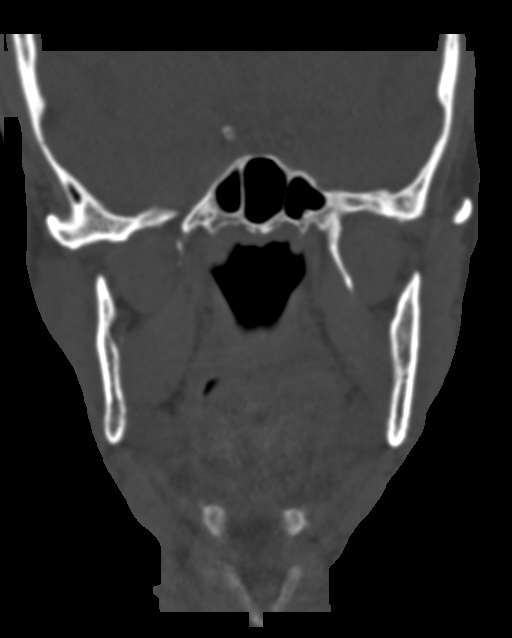

[Series 7: sagittal soft · sagittal · 0.32mm/px · 3 of 84 slices shown]
[im 28/84  bone]
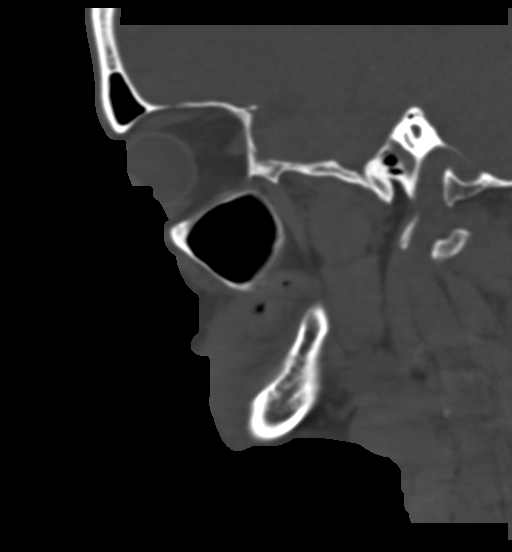
[im 42/84  bone]
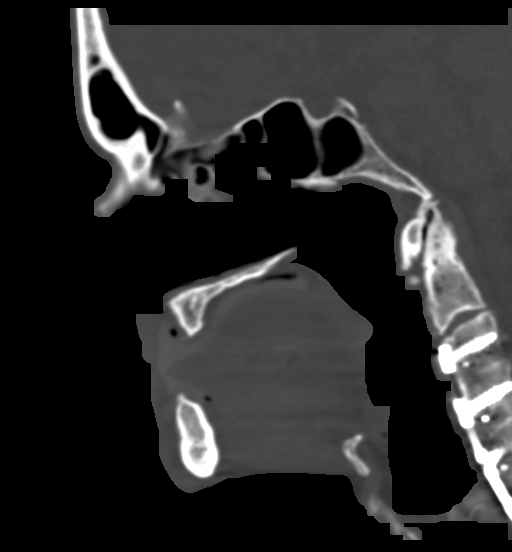
[im 56/84  bone]
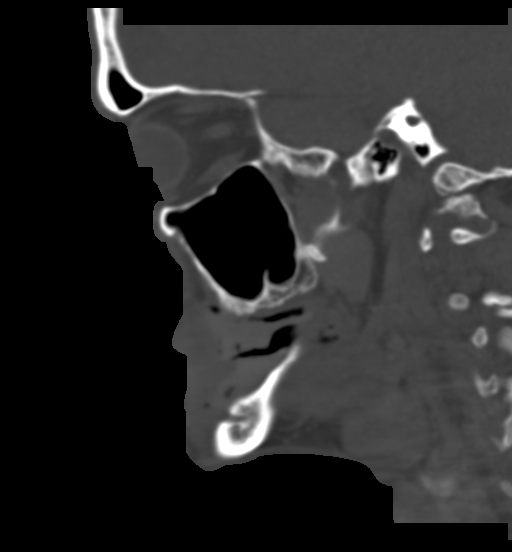

[15 of 47 positions shown; findings below may reference images not displayed]

FINDINGS: Osseous: On bone window images, no bony abnormality is evident. The
orbital rims are intact as are the zygomatic arches. The
temporomandibular joints are unremarkable. The mastoid air cells are
pneumatized.

Orbits: The orbital rims are intact and no abnormality is seen. The
globes are symmetrical as is the extraocular musculature.

Sinuses: The paranasal sinuses are well pneumatized. There is no
air-fluid level or mucosal thickening noted. The nasal turbinates
are normal in size and position and there is only mild nasal septal
deviation to the left of midline. The nasal airway is patent. The
olfactory recesses are pneumatized.

Soft tissues: No soft tissue abnormality is seen.

Limited intracranial: The intracranial portion of the study shows no
significant abnormality.
IMPRESSION: Negative CT maxillofacial.  No evidence of sinusitis.

## 2018-08-03 ENCOUNTER — Encounter: Payer: Self-pay | Admitting: Gastroenterology

## 2022-02-07 DEATH — deceased
